# Patient Record
Sex: Male | Born: 1960 | Race: White | Marital: Married | Smoking: Never smoker
Health system: Southern US, Community
[De-identification: ages and names within clinical notes are randomized; demographics above are authoritative.]

## PROBLEM LIST (undated history)

## (undated) DIAGNOSIS — N200 Calculus of kidney: Secondary | ICD-10-CM

## (undated) DIAGNOSIS — M5134 Other intervertebral disc degeneration, thoracic region: Secondary | ICD-10-CM

## (undated) DIAGNOSIS — Z9889 Other specified postprocedural states: Secondary | ICD-10-CM

## (undated) DIAGNOSIS — J189 Pneumonia, unspecified organism: Secondary | ICD-10-CM

## (undated) DIAGNOSIS — C4490 Unspecified malignant neoplasm of skin, unspecified: Secondary | ICD-10-CM

## (undated) DIAGNOSIS — I10 Essential (primary) hypertension: Secondary | ICD-10-CM

## (undated) DIAGNOSIS — Z9989 Dependence on other enabling machines and devices: Secondary | ICD-10-CM

## (undated) DIAGNOSIS — F419 Anxiety disorder, unspecified: Secondary | ICD-10-CM

## (undated) DIAGNOSIS — H409 Unspecified glaucoma: Secondary | ICD-10-CM

## (undated) DIAGNOSIS — R112 Nausea with vomiting, unspecified: Secondary | ICD-10-CM

## (undated) DIAGNOSIS — R519 Headache, unspecified: Secondary | ICD-10-CM

## (undated) DIAGNOSIS — Z85828 Personal history of other malignant neoplasm of skin: Secondary | ICD-10-CM

## (undated) DIAGNOSIS — Z973 Presence of spectacles and contact lenses: Secondary | ICD-10-CM

## (undated) DIAGNOSIS — G4733 Obstructive sleep apnea (adult) (pediatric): Secondary | ICD-10-CM

## (undated) DIAGNOSIS — E7800 Pure hypercholesterolemia, unspecified: Secondary | ICD-10-CM

## (undated) DIAGNOSIS — Z87442 Personal history of urinary calculi: Secondary | ICD-10-CM

## (undated) DIAGNOSIS — H01009 Unspecified blepharitis unspecified eye, unspecified eyelid: Secondary | ICD-10-CM

## (undated) DIAGNOSIS — C801 Malignant (primary) neoplasm, unspecified: Secondary | ICD-10-CM

## (undated) DIAGNOSIS — G500 Trigeminal neuralgia: Secondary | ICD-10-CM

## (undated) DIAGNOSIS — M5124 Other intervertebral disc displacement, thoracic region: Secondary | ICD-10-CM

## (undated) DIAGNOSIS — Z8639 Personal history of other endocrine, nutritional and metabolic disease: Secondary | ICD-10-CM

## (undated) DIAGNOSIS — E119 Type 2 diabetes mellitus without complications: Secondary | ICD-10-CM

## (undated) DIAGNOSIS — H01006 Unspecified blepharitis left eye, unspecified eyelid: Secondary | ICD-10-CM

## (undated) DIAGNOSIS — H01003 Unspecified blepharitis right eye, unspecified eyelid: Secondary | ICD-10-CM

## (undated) HISTORY — DX: Other intervertebral disc displacement, thoracic region: M51.24

## (undated) HISTORY — PX: OTHER SURGICAL HISTORY: SHX169

## (undated) HISTORY — DX: Unspecified blepharitis unspecified eye, unspecified eyelid: H01.009

## (undated) HISTORY — DX: Unspecified malignant neoplasm of skin, unspecified: C44.90

## (undated) HISTORY — PX: VASECTOMY: SHX75

## (undated) HISTORY — PX: MOHS SURGERY: SHX181

## (undated) HISTORY — PX: CYST EXCISION: SHX5701

## (undated) HISTORY — DX: Essential (primary) hypertension: I10

## (undated) HISTORY — DX: Other intervertebral disc degeneration, thoracic region: M51.34

## (undated) HISTORY — DX: Personal history of other endocrine, nutritional and metabolic disease: Z86.39

## (undated) HISTORY — PX: CYSTOSCOPY/URETEROSCOPY/HOLMIUM LASER/STENT PLACEMENT: SHX6546

## (undated) HISTORY — PX: GALLBLADDER SURGERY: SHX652

## (undated) HISTORY — PX: TONSILLECTOMY: SUR1361

## (undated) HISTORY — PX: NOSE SURGERY: SHX723

## (undated) HISTORY — DX: Pure hypercholesterolemia, unspecified: E78.00

## (undated) HISTORY — DX: Anxiety disorder, unspecified: F41.9

## (undated) HISTORY — PX: LITHOTRIPSY: SUR834

## (undated) HISTORY — PX: EXTRACORPOREAL SHOCK WAVE LITHOTRIPSY: SHX1557

## (undated) HISTORY — PX: LAPAROSCOPIC CHOLECYSTECTOMY: SUR755

---
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
## 2016-10-14 HISTORY — PX: NASAL TURBINATE REDUCTION: SHX2072

## 2017-02-14 DIAGNOSIS — I10 Essential (primary) hypertension: Secondary | ICD-10-CM | POA: Diagnosis not present

## 2017-02-14 DIAGNOSIS — M545 Low back pain: Secondary | ICD-10-CM | POA: Diagnosis not present

## 2017-02-14 DIAGNOSIS — E784 Other hyperlipidemia: Secondary | ICD-10-CM | POA: Diagnosis not present

## 2017-03-18 DIAGNOSIS — M5124 Other intervertebral disc displacement, thoracic region: Secondary | ICD-10-CM | POA: Diagnosis not present

## 2017-03-18 DIAGNOSIS — M47814 Spondylosis without myelopathy or radiculopathy, thoracic region: Secondary | ICD-10-CM | POA: Diagnosis not present

## 2017-03-18 DIAGNOSIS — M546 Pain in thoracic spine: Secondary | ICD-10-CM | POA: Diagnosis not present

## 2017-03-19 ENCOUNTER — Other Ambulatory Visit: Payer: Self-pay | Admitting: Physical Medicine and Rehabilitation

## 2017-03-19 DIAGNOSIS — M549 Dorsalgia, unspecified: Secondary | ICD-10-CM

## 2017-03-24 ENCOUNTER — Other Ambulatory Visit: Payer: Self-pay

## 2017-03-25 ENCOUNTER — Other Ambulatory Visit: Payer: 59

## 2017-03-27 ENCOUNTER — Other Ambulatory Visit: Payer: 59

## 2017-03-28 ENCOUNTER — Other Ambulatory Visit: Payer: Self-pay | Admitting: Physical Medicine and Rehabilitation

## 2017-03-28 ENCOUNTER — Ambulatory Visit
Admission: RE | Admit: 2017-03-28 | Discharge: 2017-03-28 | Disposition: A | Discharge status: Home / Self Care | Payer: 59 | Source: Ambulatory Visit | Attending: Physical Medicine and Rehabilitation | Admitting: Physical Medicine and Rehabilitation

## 2017-03-28 DIAGNOSIS — M549 Dorsalgia, unspecified: Secondary | ICD-10-CM

## 2017-03-28 DIAGNOSIS — M47817 Spondylosis without myelopathy or radiculopathy, lumbosacral region: Secondary | ICD-10-CM | POA: Diagnosis not present

## 2017-03-28 MED ORDER — TRIAMCINOLONE ACETONIDE 40 MG/ML IJ SUSP (RADIOLOGY): 60.0000 mg | Freq: Once | INTRAMUSCULAR | Status: DC

## 2017-03-28 MED ORDER — IOPAMIDOL (ISOVUE-M 300) INJECTION 61%
1.0000 mL | Freq: Once | INTRAMUSCULAR | Status: DC | PRN
Start: 2017-03-28 — End: 2017-03-29

## 2017-03-28 NOTE — Discharge Instructions (Signed)

## 2017-04-08 DIAGNOSIS — M546 Pain in thoracic spine: Secondary | ICD-10-CM | POA: Diagnosis not present

## 2017-04-08 DIAGNOSIS — M47814 Spondylosis without myelopathy or radiculopathy, thoracic region: Secondary | ICD-10-CM | POA: Diagnosis not present

## 2017-04-23 DIAGNOSIS — H2513 Age-related nuclear cataract, bilateral: Secondary | ICD-10-CM | POA: Diagnosis not present

## 2017-04-23 DIAGNOSIS — H40033 Anatomical narrow angle, bilateral: Secondary | ICD-10-CM | POA: Diagnosis not present

## 2017-05-05 DIAGNOSIS — H40033 Anatomical narrow angle, bilateral: Secondary | ICD-10-CM | POA: Diagnosis not present

## 2017-05-08 DIAGNOSIS — Z9989 Dependence on other enabling machines and devices: Secondary | ICD-10-CM | POA: Diagnosis not present

## 2017-05-08 DIAGNOSIS — J343 Hypertrophy of nasal turbinates: Secondary | ICD-10-CM | POA: Diagnosis not present

## 2017-05-08 DIAGNOSIS — G4733 Obstructive sleep apnea (adult) (pediatric): Secondary | ICD-10-CM | POA: Diagnosis not present

## 2017-05-19 DIAGNOSIS — M545 Low back pain: Secondary | ICD-10-CM | POA: Diagnosis not present

## 2017-05-19 DIAGNOSIS — I10 Essential (primary) hypertension: Secondary | ICD-10-CM | POA: Diagnosis not present

## 2017-05-19 DIAGNOSIS — E784 Other hyperlipidemia: Secondary | ICD-10-CM | POA: Diagnosis not present

## 2017-05-21 DIAGNOSIS — H10411 Chronic giant papillary conjunctivitis, right eye: Secondary | ICD-10-CM | POA: Diagnosis not present

## 2017-06-02 DIAGNOSIS — H40032 Anatomical narrow angle, left eye: Secondary | ICD-10-CM | POA: Diagnosis not present

## 2017-07-07 DIAGNOSIS — H01021 Squamous blepharitis right upper eyelid: Secondary | ICD-10-CM | POA: Diagnosis not present

## 2017-07-07 DIAGNOSIS — H01022 Squamous blepharitis right lower eyelid: Secondary | ICD-10-CM | POA: Diagnosis not present

## 2017-07-07 DIAGNOSIS — H10413 Chronic giant papillary conjunctivitis, bilateral: Secondary | ICD-10-CM | POA: Diagnosis not present

## 2017-07-10 DIAGNOSIS — Z9989 Dependence on other enabling machines and devices: Secondary | ICD-10-CM | POA: Diagnosis not present

## 2017-07-10 DIAGNOSIS — G4733 Obstructive sleep apnea (adult) (pediatric): Secondary | ICD-10-CM | POA: Diagnosis not present

## 2017-07-10 DIAGNOSIS — J343 Hypertrophy of nasal turbinates: Secondary | ICD-10-CM | POA: Diagnosis not present

## 2017-07-25 DIAGNOSIS — J343 Hypertrophy of nasal turbinates: Secondary | ICD-10-CM | POA: Diagnosis not present

## 2017-08-26 ENCOUNTER — Other Ambulatory Visit (HOSPITAL_BASED_OUTPATIENT_CLINIC_OR_DEPARTMENT_OTHER): Payer: Self-pay

## 2017-08-26 DIAGNOSIS — G4730 Sleep apnea, unspecified: Secondary | ICD-10-CM

## 2017-08-26 DIAGNOSIS — R0683 Snoring: Secondary | ICD-10-CM

## 2017-10-13 DIAGNOSIS — M25512 Pain in left shoulder: Secondary | ICD-10-CM | POA: Diagnosis not present

## 2017-10-14 HISTORY — PX: TRABECULECTOMY: SHX107

## 2017-10-14 HISTORY — PX: OTHER SURGICAL HISTORY: SHX169

## 2017-10-17 ENCOUNTER — Ambulatory Visit (HOSPITAL_BASED_OUTPATIENT_CLINIC_OR_DEPARTMENT_OTHER): Payer: 59 | Attending: Otolaryngology | Admitting: Internal Medicine

## 2017-10-17 VITALS — Ht 71.0 in | Wt 250.0 lb

## 2017-10-17 DIAGNOSIS — G4730 Sleep apnea, unspecified: Secondary | ICD-10-CM | POA: Diagnosis not present

## 2017-10-17 DIAGNOSIS — R0683 Snoring: Secondary | ICD-10-CM | POA: Insufficient documentation

## 2017-10-26 DIAGNOSIS — G4730 Sleep apnea, unspecified: Secondary | ICD-10-CM | POA: Diagnosis not present

## 2017-10-26 NOTE — Procedures (Signed)
   Patient Name: Adam Ford, Adam Ford Study Date: 10/17/2017 Gender: Male D.O.B: 15-Nov-1960 Age (years): 56 Referring Provider: Melida Quitter Height (inches): 71 Interpreting Physician: Baird Lyons MD, ABSM Weight (lbs): 250 RPSGT: Lanae Boast BMI: 35 MRN: [id number] Neck Size: 17.00 <br> <br> CLINICAL INFORMATION Sleep Study Type: NPSG  Indication for sleep study: Snoring  Epworth Sleepiness Score: 17  SLEEP STUDY TECHNIQUE As per the AASM Manual for the Scoring of Sleep and Associated Events v2.3 (April 2016) with a hypopnea requiring 4% desaturations.  The channels recorded and monitored were frontal, central and occipital EEG, electrooculogram (EOG), submentalis EMG (chin), nasal and oral airflow, thoracic and abdominal wall motion, anterior tibialis EMG, snore microphone, electrocardiogram, and pulse oximetry.  MEDICATIONS Medications self-administered by patient taken the night of the study : none reported  SLEEP ARCHITECTURE The study was initiated at 10:38:20 PM and ended at 4:43:24 AM.  Sleep onset time was 4.2 minutes and the sleep efficiency was 90.3%. The total sleep time was 329.8 minutes.  Stage REM latency was 35.5 minutes.  The patient spent 10.01% of the night in stage N1 sleep, 62.25% in stage N2 sleep, 0.00% in stage N3 and 27.74% in REM.  Alpha intrusion was absent.  Supine sleep was 63.97%.  RESPIRATORY PARAMETERS The overall apnea/hypopnea index (AHI) was 3.3 per hour. There were 9 total apneas, including 5 obstructive, 4 central and 0 mixed apneas. There were 9 hypopneas and 32 RERAs.  The AHI during Stage REM sleep was 0.7 per hour.  AHI while supine was 3.7 per hour.  The mean oxygen saturation was 93.58%. The minimum SpO2 during sleep was 87.00%.  moderate snoring was noted during this study.  CARDIAC DATA The 2 lead EKG demonstrated sinus rhythm. The mean heart rate was 55.68 beats per minute. Other EKG findings include:  None.  LEG MOVEMENT DATA The total PLMS were 0 with a resulting PLMS index of 0.00. Associated arousal with leg movement index was 0.0 .  IMPRESSIONS - No significant obstructive sleep apnea occurred during this study (AHI = 3.3/h). - No significant central sleep apnea occurred during this study (CAI = 0.7/h). - Mild oxygen desaturation was noted during this study (Min O2 = 87.00%, Mean 93.5%). - The patient snored with moderate snoring volume. - No cardiac abnormalities were noted during this study. - Clinically significant periodic limb movements did not occur during sleep. No significant associated arousals.  DIAGNOSIS      Primary snoring  RECOMMENDATIONS - Be careful with alcohol, sedatives and other CNS depressants that may worsen sleep apnea and disrupt normal sleep architecture. - Sleep hygiene should be reviewed to assess factors that may improve sleep quality. - Weight management and regular exercise should be initiated or continued if appropriate.  [Electronically signed] 10/26/2017 10:22 AM  Baird Lyons MD, ABSM Diplomate, American Board of Sleep Medicine   NPI: [id number]                          [place] [place]  ELECTRONICALLY SIGNED ON:  10/26/2017, 10:23 AM [place] PH: [phone]   FX: [phone] [place]

## 2017-11-13 DIAGNOSIS — J343 Hypertrophy of nasal turbinates: Secondary | ICD-10-CM | POA: Diagnosis not present

## 2017-11-13 DIAGNOSIS — J3489 Other specified disorders of nose and nasal sinuses: Secondary | ICD-10-CM | POA: Diagnosis not present

## 2017-11-19 DIAGNOSIS — R7303 Prediabetes: Secondary | ICD-10-CM | POA: Diagnosis not present

## 2017-11-19 DIAGNOSIS — E7849 Other hyperlipidemia: Secondary | ICD-10-CM | POA: Diagnosis not present

## 2017-11-19 DIAGNOSIS — I10 Essential (primary) hypertension: Secondary | ICD-10-CM | POA: Diagnosis not present

## 2017-11-25 DIAGNOSIS — M47814 Spondylosis without myelopathy or radiculopathy, thoracic region: Secondary | ICD-10-CM | POA: Diagnosis not present

## 2017-11-25 DIAGNOSIS — M546 Pain in thoracic spine: Secondary | ICD-10-CM | POA: Diagnosis not present

## 2017-11-25 DIAGNOSIS — M791 Myalgia, unspecified site: Secondary | ICD-10-CM | POA: Diagnosis not present

## 2017-12-01 DIAGNOSIS — M62830 Muscle spasm of back: Secondary | ICD-10-CM | POA: Diagnosis not present

## 2017-12-01 DIAGNOSIS — M546 Pain in thoracic spine: Secondary | ICD-10-CM | POA: Diagnosis not present

## 2017-12-03 DIAGNOSIS — M546 Pain in thoracic spine: Secondary | ICD-10-CM | POA: Diagnosis not present

## 2017-12-03 DIAGNOSIS — M62830 Muscle spasm of back: Secondary | ICD-10-CM | POA: Diagnosis not present

## 2017-12-08 DIAGNOSIS — M546 Pain in thoracic spine: Secondary | ICD-10-CM | POA: Diagnosis not present

## 2017-12-08 DIAGNOSIS — M62830 Muscle spasm of back: Secondary | ICD-10-CM | POA: Diagnosis not present

## 2017-12-10 DIAGNOSIS — M546 Pain in thoracic spine: Secondary | ICD-10-CM | POA: Diagnosis not present

## 2017-12-10 DIAGNOSIS — M62830 Muscle spasm of back: Secondary | ICD-10-CM | POA: Diagnosis not present

## 2017-12-17 DIAGNOSIS — M546 Pain in thoracic spine: Secondary | ICD-10-CM | POA: Diagnosis not present

## 2017-12-17 DIAGNOSIS — M62830 Muscle spasm of back: Secondary | ICD-10-CM | POA: Diagnosis not present

## 2017-12-19 DIAGNOSIS — M546 Pain in thoracic spine: Secondary | ICD-10-CM | POA: Diagnosis not present

## 2017-12-19 DIAGNOSIS — M62830 Muscle spasm of back: Secondary | ICD-10-CM | POA: Diagnosis not present

## 2017-12-22 DIAGNOSIS — M62830 Muscle spasm of back: Secondary | ICD-10-CM | POA: Diagnosis not present

## 2017-12-22 DIAGNOSIS — M546 Pain in thoracic spine: Secondary | ICD-10-CM | POA: Diagnosis not present

## 2017-12-24 DIAGNOSIS — M62830 Muscle spasm of back: Secondary | ICD-10-CM | POA: Diagnosis not present

## 2017-12-24 DIAGNOSIS — M546 Pain in thoracic spine: Secondary | ICD-10-CM | POA: Diagnosis not present

## 2018-01-02 DIAGNOSIS — J3489 Other specified disorders of nose and nasal sinuses: Secondary | ICD-10-CM | POA: Diagnosis not present

## 2018-01-02 DIAGNOSIS — C44311 Basal cell carcinoma of skin of nose: Secondary | ICD-10-CM | POA: Diagnosis not present

## 2018-01-06 DIAGNOSIS — M546 Pain in thoracic spine: Secondary | ICD-10-CM | POA: Diagnosis not present

## 2018-01-06 DIAGNOSIS — M47814 Spondylosis without myelopathy or radiculopathy, thoracic region: Secondary | ICD-10-CM | POA: Diagnosis not present

## 2018-01-06 DIAGNOSIS — M62830 Muscle spasm of back: Secondary | ICD-10-CM | POA: Diagnosis not present

## 2018-01-14 DIAGNOSIS — M546 Pain in thoracic spine: Secondary | ICD-10-CM | POA: Diagnosis not present

## 2018-01-14 DIAGNOSIS — M62830 Muscle spasm of back: Secondary | ICD-10-CM | POA: Diagnosis not present

## 2018-01-19 DIAGNOSIS — M62830 Muscle spasm of back: Secondary | ICD-10-CM | POA: Diagnosis not present

## 2018-01-19 DIAGNOSIS — M546 Pain in thoracic spine: Secondary | ICD-10-CM | POA: Diagnosis not present

## 2018-01-21 DIAGNOSIS — M62830 Muscle spasm of back: Secondary | ICD-10-CM | POA: Diagnosis not present

## 2018-01-21 DIAGNOSIS — M546 Pain in thoracic spine: Secondary | ICD-10-CM | POA: Diagnosis not present

## 2018-01-28 DIAGNOSIS — M546 Pain in thoracic spine: Secondary | ICD-10-CM | POA: Diagnosis not present

## 2018-01-28 DIAGNOSIS — M62830 Muscle spasm of back: Secondary | ICD-10-CM | POA: Diagnosis not present

## 2018-02-05 DIAGNOSIS — D22111 Melanocytic nevi of right upper eyelid, including canthus: Secondary | ICD-10-CM | POA: Diagnosis not present

## 2018-02-05 DIAGNOSIS — C44311 Basal cell carcinoma of skin of nose: Secondary | ICD-10-CM | POA: Diagnosis not present

## 2018-02-16 DIAGNOSIS — I10 Essential (primary) hypertension: Secondary | ICD-10-CM | POA: Diagnosis not present

## 2018-02-16 DIAGNOSIS — H5711 Ocular pain, right eye: Secondary | ICD-10-CM | POA: Diagnosis not present

## 2018-02-16 DIAGNOSIS — G44209 Tension-type headache, unspecified, not intractable: Secondary | ICD-10-CM | POA: Diagnosis not present

## 2018-02-16 DIAGNOSIS — E7849 Other hyperlipidemia: Secondary | ICD-10-CM | POA: Diagnosis not present

## 2018-02-16 DIAGNOSIS — H40013 Open angle with borderline findings, low risk, bilateral: Secondary | ICD-10-CM | POA: Diagnosis not present

## 2018-02-16 DIAGNOSIS — R7303 Prediabetes: Secondary | ICD-10-CM | POA: Diagnosis not present

## 2018-02-25 DIAGNOSIS — C44311 Basal cell carcinoma of skin of nose: Secondary | ICD-10-CM | POA: Diagnosis not present

## 2018-03-16 DIAGNOSIS — H01021 Squamous blepharitis right upper eyelid: Secondary | ICD-10-CM | POA: Diagnosis not present

## 2018-03-16 DIAGNOSIS — H01022 Squamous blepharitis right lower eyelid: Secondary | ICD-10-CM | POA: Diagnosis not present

## 2018-03-16 DIAGNOSIS — H5711 Ocular pain, right eye: Secondary | ICD-10-CM | POA: Diagnosis not present

## 2018-03-17 DIAGNOSIS — J343 Hypertrophy of nasal turbinates: Secondary | ICD-10-CM | POA: Diagnosis not present

## 2018-03-17 DIAGNOSIS — R51 Headache: Secondary | ICD-10-CM | POA: Diagnosis not present

## 2018-03-27 DIAGNOSIS — D225 Melanocytic nevi of trunk: Secondary | ICD-10-CM | POA: Diagnosis not present

## 2018-03-27 DIAGNOSIS — Z85828 Personal history of other malignant neoplasm of skin: Secondary | ICD-10-CM | POA: Diagnosis not present

## 2018-03-27 DIAGNOSIS — L738 Other specified follicular disorders: Secondary | ICD-10-CM | POA: Diagnosis not present

## 2018-04-07 DIAGNOSIS — R590 Localized enlarged lymph nodes: Secondary | ICD-10-CM | POA: Diagnosis not present

## 2018-04-23 ENCOUNTER — Encounter

## 2018-04-23 ENCOUNTER — Encounter: Payer: Self-pay | Admitting: Neurology

## 2018-04-23 ENCOUNTER — Ambulatory Visit: Payer: 59 | Admitting: Neurology

## 2018-04-23 ENCOUNTER — Encounter: Payer: Self-pay | Admitting: *Deleted

## 2018-04-23 VITALS — BP 157/79 | HR 59 | Ht 71.0 in | Wt 263.0 lb

## 2018-04-23 DIAGNOSIS — G500 Trigeminal neuralgia: Secondary | ICD-10-CM | POA: Diagnosis not present

## 2018-04-23 DIAGNOSIS — H5711 Ocular pain, right eye: Secondary | ICD-10-CM

## 2018-04-23 DIAGNOSIS — R51 Headache: Secondary | ICD-10-CM | POA: Diagnosis not present

## 2018-04-23 DIAGNOSIS — H532 Diplopia: Secondary | ICD-10-CM

## 2018-04-23 DIAGNOSIS — H93A9 Pulsatile tinnitus, unspecified ear: Secondary | ICD-10-CM

## 2018-04-23 DIAGNOSIS — R519 Headache, unspecified: Secondary | ICD-10-CM

## 2018-04-23 DIAGNOSIS — Z8249 Family history of ischemic heart disease and other diseases of the circulatory system: Secondary | ICD-10-CM

## 2018-04-23 MED ORDER — PREDNISONE 20 MG PO TABS: 60.0000 mg | ORAL_TABLET | Freq: Every day | ORAL | 3 refills | Status: DC

## 2018-04-23 NOTE — Progress Notes (Signed)
[facility]    Provider:  Dr Jaynee Eagles Referring Provider: Damaris Hippo, MD Primary Care Physician:  Donald Prose, MD  CC:  Pain above right eye  HPI:  Adam Ford is a 57 y.o. male here as a referral from Dr. Inda Castle for eye pain rule out cluster headaches.  Past medical history anxiety, hypertension, high cholesterol. Back in February after the new year he had a regular eye check and a month later he had pain over his right eye and headaches. Headaches worsened. He saw ophtho again Dr. Patrice Paradise and everything was fine. Dr. Redmond Baseman performed sinus procedures in February in the same timeframe the headaches started but unclear. Headaches worsening and a dull pain over the forehead. Mostly on the right, right at one point (points to the supratrochlear nerve). Every other week he gets a sharp strong pain he had to close his eye and squint and slow down, no lacrimation or other autonomic symptoms. Lasts briefly 10 minutes not stabbing. The pain above the right eyebrown is continuous all day long and the forehead is dull. He wake up with the headaches, alleve and tylenol. Ringing in the ears. Worsening.  No migrainous symptoms. Blurry.double vision right eye, can wake with the headaches and position makes it worse. No other focal neurologic deficits, associated symptoms, inciting events or modifiable factors.  Reviewed notes, labs and imaging from outside physicians, which showed:  Reviewed [facility] notes.  Patient was seen for an emergency visit.  Chief complaint was feeling weak and a sore sensation in the right eye for 3 weeks.  Headache above the right brow that last from several minutes to an hour.  Resolves on its own not currently on treatment.  Examination showed no APD, extraocular movements intact, external exam normal, intraocular pressure borderline, slit-lamp exam unremarkable, no optic nerve edema normal-appearing optic nerves, diagnosed with tension headache versus  cluster headaches, pain in her around the eye, borderline glaucoma angle with borderline findings.   Months ago started having pain of her right eye went to eye doctor and underwent testing to determine eyes not the issue always a dull ache above his right eye once a week or every other week he will have worsened shooting pain, starting to have low-grade pain over both eyes he takes 2 Aleve's and 2 Tylenols twice daily also for bulging disc if he does not take those the pain is worse.  Review of Systems: Patient complains of symptoms per HPI as well as the following symptoms: Blurred vision, eye pain, memory loss, headache, insomnia. Pertinent negatives and positives per HPI. All others negative.   Social History   Socioeconomic History  . Marital status: Married    Spouse name: Not on file  . Number of children: 4  . Years of education: Not on file  . Highest education level: Master's degree (e.g., MA, MS, MEng, MEd, MSW, MBA)  Occupational History  . Not on file  Social Needs  . Financial resource strain: Not on file  . Food insecurity:    Worry: Not on file    Inability: Not on file  . Transportation needs:    Medical: Not on file    Non-medical: Not on file  Tobacco Use  . Smoking status: Never Smoker  . Smokeless tobacco: Never Used  Substance and Sexual Activity  . Alcohol use: Never    Frequency: Never  . Drug use: Never  . Sexual activity: Not on file  Lifestyle  . Physical activity:  Days per week: Not on file    Minutes per session: Not on file  . Stress: Not on file  Relationships  . Social connections:    Talks on phone: Not on file    Gets together: Not on file    Attends religious service: Not on file    Active member of club or organization: Not on file    Attends meetings of clubs or organizations: Not on file    Relationship status: Not on file  . Intimate partner violence:    Fear of current or ex partner: Not on file    Emotionally abused: Not on  file    Physically abused: Not on file    Forced sexual activity: Not on file  Other Topics Concern  . Not on file  Social History Narrative   Lives at home with his wife & children   Right handed   Caffeine: quit in 2015 but now drinks diet soda which helps his headaches. He drinks about 6-7 cans during the work days.     Family History  Problem Relation Age of Onset  . Hypertension Mother   . Hypertension Father   . Aneurysm Father   . Colon cancer Maternal Grandmother   . Stroke Paternal Grandfather   . Stroke Other   . High Cholesterol Other        on both sides  . Other Neg Hx     Past Medical History:  Diagnosis Date  . Anxiety   . Blepharitis   . Bulging of thoracic intervertebral disc    T8  . Hx of non-insulin dependent diabetes mellitus   . Hypercholesteremia   . Hypertension   . Skin cancer     Past Surgical History:  Procedure Laterality Date  . GALLBLADDER SURGERY    . GASTRIC SLEEVE    . lasers yag iridotomy Bilateral   . LITHOTRIPSY    . MOHS SURGERY     Dr. Pablo Ledger  . NOSE SURGERY     Dr. Redmond Baseman; x2  . TONSILLECTOMY     as a child   . VASECTOMY      Current Outpatient Medications  Medication Sig Dispense Refill  . acetaminophen (TYLENOL) 500 MG tablet Take 1,000 mg by mouth 2 (two) times daily.    Marland Kitchen amLODipine-benazepril (LOTREL) 10-40 MG capsule Take 1 capsule by mouth daily.     . metoprolol succinate (TOPROL-XL) 50 MG 24 hr tablet Take 50 mg by mouth daily.     . Multiple Vitamin (MULTIVITAMIN) capsule Take 1 capsule by mouth daily.     . Naproxen Sodium (ALEVE PO) Take 2 tablets by mouth 2 (two) times daily.     . rosuvastatin (CRESTOR) 10 MG tablet Take 10 mg by mouth daily.     . predniSONE (DELTASONE) 20 MG tablet Take 3 tablets (60 mg total) by mouth daily. 15 tablet 3   No current facility-administered medications for this visit.     Allergies as of 04/23/2018 - Review Complete 04/23/2018  Allergen Reaction Noted  . Bee venom  Anaphylaxis 03/28/2017    Vitals: BP (!) 157/79 (BP Location: Right Arm, Patient Position: Sitting) Comment: has been about 140/85ish per pt report  Pulse (!) 59   Ht '5\' 11"'$  (1.803 m)   Wt 263 lb (119.3 kg)   BMI 36.68 kg/m  Last Weight:  Wt Readings from Last 1 Encounters:  04/23/18 263 lb (119.3 kg)   Last Height:   Ht Readings from  Last 1 Encounters:  04/23/18 _0  (1.803 m)    Physical exam: Exam: Gen: NAD, conversant, well nourised, obese, well groomed                     CV: RRR, no MRG. No Carotid Bruits. No peripheral edema, warm, nontender Eyes: Conjunctivae clear without exudates or hemorrhage  Neuro: Detailed Neurologic Exam  Speech:    Speech is normal; fluent and spontaneous with normal comprehension.  Cognition:    The patient is oriented to person, place, and time;     recent and remote memory intact;     language fluent;     normal attention, concentration,     fund of knowledge Cranial Nerves:    The pupils are equal, round, and reactive to light. The fundi are normal and spontaneous venous pulsations are present. Visual fields are full to finger confrontation. Extraocular movements are intact. Trigeminal sensation is intact and the muscles of mastication are normal. The face is symmetric. The palate elevates in the midline. Hearing intact. Voice is normal. Shoulder shrug is normal. The tongue has normal motion without fasciculations.   Coordination:    Normal finger to nose and heel to shin. Normal rapid alternating movements.   Gait:    Heel-toe and tandem gait are normal.   Motor Observation:    No asymmetry, no atrophy, and no involuntary movements noted. Tone:    Normal muscle tone.    Posture:    Posture is normal. normal erect    Strength:    Strength is V/V in the upper and lower limbs.      Sensation: intact to LT     Reflex Exam:  DTR's:    Deep tendon reflexes in the upper and lower extremities are normal bilaterally.    Toes:    The toes are downgoing bilaterally.   Clonus:    Clonus is absent.      Assessment/Plan:  Patient with pain in the right supraorbital distribution. + right eye vision changes, + increased hearing changes,  No migrainous symptoms. No autonomic symptoms. Father died of a brain aneurysm, hx of strokes in grandfather and great grandfather.   MRI brain and MRA head: MRI brain due to concerning symptoms of new onset headaches > 50, morning headaches, positional headaches,vision changes  to look for space occupying mass, chiari or intracranial hypertension (pseudotumor). Also MRA head due to pulsatile tinnitus and FHx of cerebral ruptured aneurysms.  Labs today including esr/crp Steroid dose Supraorbital nerve block   Orders Placed This Encounter  Procedures  . MR BRAIN W WO CONTRAST  . MR MRA HEAD WO CONTRAST  . Sedimentation rate  . C-reactive protein  . Comprehensive metabolic panel  . CBC  . TSH   Meds ordered this encounter  Medications  . predniSONE (DELTASONE) 20 MG tablet    Sig: Take 3 tablets (60 mg total) by mouth daily.    Dispense:  15 tablet    Refill:  3     Discussed: To prevent or relieve headaches, try the following: Cool Compress. Lie down and place a cool compress on your head.  Avoid headache triggers. If certain foods or odors seem to have triggered your migraines in the past, avoid them. A headache diary might help you identify triggers.  Include physical activity in your daily routine. Try a daily walk or other moderate aerobic exercise.  Manage stress. Find healthy ways to cope with the stressors, such as delegating  tasks on your to-do list.  Practice relaxation techniques. Try deep breathing, yoga, massage and visualization.  Eat regularly. Eating regularly scheduled meals and maintaining a healthy diet might help prevent headaches. Also, drink plenty of fluids.  Follow a regular sleep schedule. Sleep deprivation might contribute to  headaches Consider biofeedback. With this mind-body technique, you learn to control certain bodily functions - such as muscle tension, heart rate and blood pressure - to prevent headaches or reduce headache pain.    Proceed to emergency room if you experience new or worsening symptoms or symptoms do not resolve, if you have new neurologic symptoms or if headache is severe, or for any concerning symptom.   Provided education and documentation from American headache Society toolbox including articles on: chronic migraine medication overuse headache, chronic migraines, prevention of migraines, behavioral and other nonpharmacologic treatments for headache.  Performed by Dr. Jaynee Eagles M.D. All procedures a documented below were medically necessary, reasonable and appropriate based on the patient's history, medical diagnosis and physician opinion. Verbal informed consent was obtained from the patient, patient was informed of potential risk of procedure, including bruising, bleeding, hematoma formation, infection, muscle weakness, muscle pain, numbness, transient hypertension, transient hyperglycemia and transient insomnia among others. All areas injected were topically clean with isopropyl rubbing alcohol. Nonsterile nonlatex gloves were worn during the procedure.  1.  Supraorbital nerve block (64400): Supraorbital nerve site was identified along the incision of the frontal bone on the orbital/supraorbital ridge. Medication was injected into the left and right supraorbital nerve areas. Patient's condition is associated with inflammation of the supraorbital and associated muscle groups. Injection was deemed medically necessary, reasonable and appropriate. Injection represents a separate and unique surgical service.   Sarina Ill, MD  [facility] [address] [place], [place] [address]  Phone [phone] Fax [phone]

## 2018-04-23 NOTE — Patient Instructions (Signed)
MRI of the brain and blood vessels Labs Steroid 60mg  (3 pills) daily with breakfast  Prednisone tablets What is this medicine? PREDNISONE (PRED ni sone) is a corticosteroid. It is commonly used to treat inflammation of the skin, joints, lungs, and other organs. Common conditions treated include asthma, allergies, and arthritis. It is also used for other conditions, such as blood disorders and diseases of the adrenal glands. This medicine may be used for other purposes; ask your health care provider or pharmacist if you have questions. COMMON BRAND NAME(S): Deltasone, Predone, Sterapred, Sterapred DS What should I tell my health care provider before I take this medicine? They need to know if you have any of these conditions: -Cushing's syndrome -diabetes -glaucoma -heart disease -high blood pressure -infection (especially a virus infection such as chickenpox, cold sores, or herpes) -kidney disease -liver disease -mental illness -myasthenia gravis -osteoporosis -seizures -stomach or intestine problems -thyroid disease -an unusual or allergic reaction to lactose, prednisone, other medicines, foods, dyes, or preservatives -pregnant or trying to get pregnant -breast-feeding How should I use this medicine? Take this medicine by mouth with a glass of water. Follow the directions on the prescription label. Take this medicine with food. If you are taking this medicine once a day, take it in the morning. Do not take more medicine than you are told to take. Do not suddenly stop taking your medicine because you may develop a severe reaction. Your doctor will tell you how much medicine to take. If your doctor wants you to stop the medicine, the dose may be slowly lowered over time to avoid any side effects. Talk to your pediatrician regarding the use of this medicine in children. Special care may be needed. Overdosage: If you think you have taken too much of this medicine contact a poison control  center or emergency room at once. NOTE: This medicine is only for you. Do not share this medicine with others. What if I miss a dose? If you miss a dose, take it as soon as you can. If it is almost time for your next dose, talk to your doctor or health care professional. You may need to miss a dose or take an extra dose. Do not take double or extra doses without advice. What may interact with this medicine? Do not take this medicine with any of the following medications: -metyrapone -mifepristone This medicine may also interact with the following medications: -aminoglutethimide -amphotericin B -aspirin and aspirin-like medicines -barbiturates -certain medicines for diabetes, like glipizide or glyburide -cholestyramine -cholinesterase inhibitors -cyclosporine -digoxin -diuretics -ephedrine -male hormones, like estrogens and birth control pills -isoniazid -ketoconazole -NSAIDS, medicines for pain and inflammation, like ibuprofen or naproxen -phenytoin -rifampin -toxoids -vaccines -warfarin This list may not describe all possible interactions. Give your health care provider a list of all the medicines, herbs, non-prescription drugs, or dietary supplements you use. Also tell them if you smoke, drink alcohol, or use illegal drugs. Some items may interact with your medicine. What should I watch for while using this medicine? Visit your doctor or health care professional for regular checks on your progress. If you are taking this medicine over a prolonged period, carry an identification card with your name and address, the type and dose of your medicine, and your doctor's name and address. This medicine may increase your risk of getting an infection. Tell your doctor or health care professional if you are around anyone with measles or chickenpox, or if you develop sores or blisters that do not heal  properly. If you are going to have surgery, tell your doctor or health care professional  that you have taken this medicine within the last twelve months. Ask your doctor or health care professional about your diet. You may need to lower the amount of salt you eat. This medicine may affect blood sugar levels. If you have diabetes, check with your doctor or health care professional before you change your diet or the dose of your diabetic medicine. What side effects may I notice from receiving this medicine? Side effects that you should report to your doctor or health care professional as soon as possible: -allergic reactions like skin rash, itching or hives, swelling of the face, lips, or tongue -changes in emotions or moods -changes in vision -depressed mood -eye pain -fever or chills, cough, sore throat, pain or difficulty passing urine -increased thirst -swelling of ankles, feet Side effects that usually do not require medical attention (report to your doctor or health care professional if they continue or are bothersome): -confusion, excitement, restlessness -headache -nausea, vomiting -skin problems, acne, thin and shiny skin -trouble sleeping -weight gain This list may not describe all possible side effects. Call your doctor for medical advice about side effects. You may report side effects to FDA at 1-800-FDA-1088. Where should I keep my medicine? Keep out of the reach of children. Store at room temperature between 15 and 30 degrees C (59 and 86 degrees F). Protect from light. Keep container tightly closed. Throw away any unused medicine after the expiration date. NOTE: This sheet is a summary. It may not cover all possible information. If you have questions about this medicine, talk to your doctor, pharmacist, or health care provider.  2018 Elsevier/Gold Standard (2011-05-16 10:57:14)

## 2018-04-23 NOTE — Progress Notes (Signed)
Nerve block w/o steroid prepared & administered by MD: 0.25% Bupivocaine 1.5 mL LOT: [id number] EXP: 03/22  2% Lidocaine LOT: [id number] EXP: 06/15/2019

## 2018-04-24 ENCOUNTER — Telehealth: Payer: Self-pay | Admitting: Neurology

## 2018-04-24 LAB — COMPREHENSIVE METABOLIC PANEL
ALT: 33 IU/L (ref 0–44)
AST: 31 IU/L (ref 0–40)
Albumin/Globulin Ratio: 1.8 (ref 1.2–2.2)
Albumin: 4.5 g/dL (ref 3.5–5.5)
Alkaline Phosphatase: 62 IU/L (ref 39–117)
BUN/Creatinine Ratio: 17 (ref 9–20)
BUN: 18 mg/dL (ref 6–24)
Bilirubin Total: 0.8 mg/dL (ref 0.0–1.2)
CO2: 23 mmol/L (ref 20–29)
Calcium: 9.5 mg/dL (ref 8.7–10.2)
Chloride: 104 mmol/L (ref 96–106)
Creatinine, Ser: 1.06 mg/dL (ref 0.76–1.27)
GFR calc Af Amer: 92 mL/min/{1.73_m2} (ref >59)
GFR calc non Af Amer: 79 mL/min/{1.73_m2} (ref >59)
Globulin, Total: 2.5 g/dL (ref 1.5–4.5)
Glucose: 124 mg/dL — ABNORMAL HIGH (ref 65–99)
Potassium: 4.4 mmol/L (ref 3.5–5.2)
Sodium: 142 mmol/L (ref 134–144)
Total Protein: 7 g/dL (ref 6.0–8.5)

## 2018-04-24 LAB — C-REACTIVE PROTEIN: CRP: 1 mg/L (ref 0–10)

## 2018-04-24 LAB — CBC
Hematocrit: 41.8 % (ref 37.5–51.0)
Hemoglobin: 13.7 g/dL (ref 13.0–17.7)
MCH: 26.2 pg — ABNORMAL LOW (ref 26.6–33.0)
MCHC: 32.8 g/dL (ref 31.5–35.7)
MCV: 80 fL (ref 79–97)
Platelets: 226 10*3/uL (ref 150–450)
RBC: 5.22 x10E6/uL (ref 4.14–5.80)
RDW: 16 % — ABNORMAL HIGH (ref 12.3–15.4)
WBC: 5.1 10*3/uL (ref 3.4–10.8)

## 2018-04-24 LAB — TSH: TSH: 0.956 u[IU]/mL (ref 0.450–4.500)

## 2018-04-24 LAB — SEDIMENTATION RATE: Sed Rate: 5 mm/hr (ref 0–30)

## 2018-04-24 NOTE — Telephone Encounter (Signed)
Called the patient and made him aware of the lab work findings. Informed him that all looked good except the glucose level was slightly elevated. I have instructed the patient to make his PCP aware of this so they can keep an eye on his glucose levels moving forward. Pt verbalized understanding. Pt had no questions at this time but was encouraged to call back if questions arise. Pt will wait to hear from someone to get him scheduled with MRI

## 2018-04-24 NOTE — Telephone Encounter (Signed)
-----   Message from Melvenia Beam, MD sent at 04/24/2018  7:50 AM EDT ----- Glucose elevated otherwise normal. Glucose could be elevated because he recently ate however if this is not the case he should be screened for diabetes by his primary care doctor, I didn't see that in his records (please inquire) thanks.

## 2018-04-25 ENCOUNTER — Encounter: Payer: Self-pay | Admitting: Neurology

## 2018-04-27 DIAGNOSIS — R51 Headache: Secondary | ICD-10-CM

## 2018-04-27 DIAGNOSIS — G500 Trigeminal neuralgia: Secondary | ICD-10-CM | POA: Insufficient documentation

## 2018-04-27 DIAGNOSIS — R519 Headache, unspecified: Secondary | ICD-10-CM | POA: Insufficient documentation

## 2018-04-28 ENCOUNTER — Telehealth: Payer: Self-pay | Admitting: Neurology

## 2018-04-28 NOTE — Telephone Encounter (Signed)
[place]: [phone] & [phone] (exp. 04/27/18 to 06/11/18)  Left voicemail for pt to call back about scheduling.

## 2018-04-28 NOTE — Telephone Encounter (Signed)
Patient returned my call he is scheduled for 04/29/18 at [facility].

## 2018-04-29 ENCOUNTER — Ambulatory Visit: Payer: 59

## 2018-04-29 DIAGNOSIS — R519 Headache, unspecified: Secondary | ICD-10-CM

## 2018-04-29 DIAGNOSIS — R51 Headache: Secondary | ICD-10-CM

## 2018-04-29 DIAGNOSIS — H5711 Ocular pain, right eye: Secondary | ICD-10-CM | POA: Diagnosis not present

## 2018-04-29 DIAGNOSIS — H93A9 Pulsatile tinnitus, unspecified ear: Secondary | ICD-10-CM

## 2018-04-29 DIAGNOSIS — G500 Trigeminal neuralgia: Secondary | ICD-10-CM

## 2018-04-29 DIAGNOSIS — Z8249 Family history of ischemic heart disease and other diseases of the circulatory system: Secondary | ICD-10-CM

## 2018-04-29 DIAGNOSIS — H532 Diplopia: Secondary | ICD-10-CM

## 2018-04-29 MED ORDER — GADOPENTETATE DIMEGLUMINE 469.01 MG/ML IV SOLN
20.0000 mL | Freq: Once | INTRAVENOUS | Status: AC | PRN
  Administered 2018-04-29: 20 mL via INTRAVENOUS

## 2018-04-30 ENCOUNTER — Other Ambulatory Visit: Payer: Self-pay | Admitting: Neurology

## 2018-04-30 MED ORDER — GABAPENTIN 300 MG PO CAPS: 300.0000 mg | ORAL_CAPSULE | Freq: Three times a day (TID) | ORAL | 11 refills | Status: DC

## 2018-05-01 DIAGNOSIS — R51 Headache: Secondary | ICD-10-CM | POA: Diagnosis not present

## 2018-05-01 DIAGNOSIS — I10 Essential (primary) hypertension: Secondary | ICD-10-CM | POA: Diagnosis not present

## 2018-05-01 DIAGNOSIS — R7303 Prediabetes: Secondary | ICD-10-CM | POA: Diagnosis not present

## 2018-05-04 ENCOUNTER — Telehealth: Payer: Self-pay | Admitting: *Deleted

## 2018-05-04 NOTE — Telephone Encounter (Addendum)
Spoke with pt and informed him that his MRI brain is normal for his age and his MRA head is normal. Pt very appreciative and asked if there was any further follow-up or testing needed per Dr. Jaynee Eagles. RN advised pt she would ask Dr. Jaynee Eagles as there was no clear answer noted at this time since testing was being completed. Pt appreciative.    ----- Message from Melvenia Beam, MD sent at 05/02/2018 10:32 AM EDT ----- MRI of the brain is normal for age. thanks  Notes recorded by Melvenia Beam, MD on 05/02/2018 at 10:38 AM EDT Normal MRA of the blood vessels of the head

## 2018-05-05 ENCOUNTER — Encounter (INDEPENDENT_AMBULATORY_CARE_PROVIDER_SITE_OTHER): Payer: Self-pay

## 2018-05-05 NOTE — Telephone Encounter (Signed)
I will email him thanks

## 2018-05-07 ENCOUNTER — Other Ambulatory Visit: Payer: Self-pay | Admitting: Neurology

## 2018-05-07 ENCOUNTER — Telehealth: Payer: Self-pay | Admitting: Neurology

## 2018-05-07 DIAGNOSIS — R51 Headache: Secondary | ICD-10-CM

## 2018-05-07 DIAGNOSIS — H539 Unspecified visual disturbance: Secondary | ICD-10-CM

## 2018-05-07 DIAGNOSIS — H5711 Ocular pain, right eye: Secondary | ICD-10-CM

## 2018-05-07 DIAGNOSIS — R519 Headache, unspecified: Secondary | ICD-10-CM

## 2018-05-07 DIAGNOSIS — G8929 Other chronic pain: Secondary | ICD-10-CM

## 2018-05-07 DIAGNOSIS — G500 Trigeminal neuralgia: Secondary | ICD-10-CM

## 2018-05-07 NOTE — Telephone Encounter (Signed)
lvm for pt to call back about scheduling [facility] Auth: [id number] (exp. 05/07/18 to 06/21/18)

## 2018-05-12 ENCOUNTER — Telehealth: Payer: Self-pay | Admitting: Neurology

## 2018-05-12 NOTE — Telephone Encounter (Signed)
Patient is going out of town he will call around second week of August to schedule. MR [place].

## 2018-05-14 ENCOUNTER — Other Ambulatory Visit: Payer: Self-pay | Admitting: Neurology

## 2018-05-14 DIAGNOSIS — G500 Trigeminal neuralgia: Secondary | ICD-10-CM

## 2018-05-14 DIAGNOSIS — G8929 Other chronic pain: Secondary | ICD-10-CM

## 2018-05-14 DIAGNOSIS — R519 Headache, unspecified: Secondary | ICD-10-CM

## 2018-05-14 DIAGNOSIS — R51 Headache: Secondary | ICD-10-CM

## 2018-05-14 DIAGNOSIS — H05111 Granuloma of right orbit: Secondary | ICD-10-CM

## 2018-05-16 ENCOUNTER — Encounter: Payer: Self-pay | Admitting: Neurology

## 2018-05-18 ENCOUNTER — Other Ambulatory Visit: Payer: Self-pay | Admitting: Neurology

## 2018-05-18 ENCOUNTER — Telehealth: Payer: Self-pay | Admitting: *Deleted

## 2018-05-18 DIAGNOSIS — S93401A Sprain of unspecified ligament of right ankle, initial encounter: Secondary | ICD-10-CM | POA: Diagnosis not present

## 2018-05-18 DIAGNOSIS — H01022 Squamous blepharitis right lower eyelid: Secondary | ICD-10-CM | POA: Diagnosis not present

## 2018-05-18 DIAGNOSIS — H01021 Squamous blepharitis right upper eyelid: Secondary | ICD-10-CM | POA: Diagnosis not present

## 2018-05-18 DIAGNOSIS — H01024 Squamous blepharitis left upper eyelid: Secondary | ICD-10-CM | POA: Diagnosis not present

## 2018-05-18 MED ORDER — GABAPENTIN 300 MG PO CAPS: 600.0000 mg | ORAL_CAPSULE | Freq: Three times a day (TID) | ORAL | 11 refills | Status: DC

## 2018-05-18 NOTE — Telephone Encounter (Addendum)
Error

## 2018-05-25 NOTE — Telephone Encounter (Signed)
Patient returned my call he is scheduled for 06/03/18 at [facility].

## 2018-06-03 ENCOUNTER — Ambulatory Visit (INDEPENDENT_AMBULATORY_CARE_PROVIDER_SITE_OTHER): Payer: 59

## 2018-06-03 DIAGNOSIS — H05111 Granuloma of right orbit: Secondary | ICD-10-CM

## 2018-06-03 DIAGNOSIS — G8929 Other chronic pain: Secondary | ICD-10-CM

## 2018-06-03 DIAGNOSIS — G500 Trigeminal neuralgia: Secondary | ICD-10-CM | POA: Diagnosis not present

## 2018-06-03 DIAGNOSIS — R519 Headache, unspecified: Secondary | ICD-10-CM

## 2018-06-03 DIAGNOSIS — R51 Headache: Secondary | ICD-10-CM | POA: Diagnosis not present

## 2018-06-03 MED ORDER — GADOBENATE DIMEGLUMINE 529 MG/ML IV SOLN
20.0000 mL | Freq: Once | INTRAVENOUS | Status: AC | PRN
  Administered 2018-06-03: 20 mL via INTRAVENOUS

## 2018-06-09 DIAGNOSIS — R109 Unspecified abdominal pain: Secondary | ICD-10-CM | POA: Diagnosis not present

## 2018-06-09 DIAGNOSIS — R1031 Right lower quadrant pain: Secondary | ICD-10-CM | POA: Diagnosis not present

## 2018-06-13 ENCOUNTER — Other Ambulatory Visit: Payer: Self-pay | Admitting: Family Medicine

## 2018-06-16 ENCOUNTER — Other Ambulatory Visit: Payer: Self-pay | Admitting: Family Medicine

## 2018-06-17 ENCOUNTER — Other Ambulatory Visit: Payer: Self-pay | Admitting: Family Medicine

## 2018-06-17 DIAGNOSIS — M7989 Other specified soft tissue disorders: Secondary | ICD-10-CM

## 2018-06-22 ENCOUNTER — Ambulatory Visit
Admission: RE | Admit: 2018-06-22 | Discharge: 2018-06-22 | Disposition: A | Discharge status: Home / Self Care | Payer: 59 | Source: Ambulatory Visit | Attending: Family Medicine | Admitting: Family Medicine

## 2018-06-22 DIAGNOSIS — R1903 Right lower quadrant abdominal swelling, mass and lump: Secondary | ICD-10-CM | POA: Diagnosis not present

## 2018-06-22 DIAGNOSIS — M7989 Other specified soft tissue disorders: Secondary | ICD-10-CM

## 2018-07-15 DIAGNOSIS — G44029 Chronic cluster headache, not intractable: Secondary | ICD-10-CM | POA: Diagnosis not present

## 2018-07-15 DIAGNOSIS — G500 Trigeminal neuralgia: Secondary | ICD-10-CM | POA: Diagnosis not present

## 2018-08-24 DIAGNOSIS — H109 Unspecified conjunctivitis: Secondary | ICD-10-CM | POA: Diagnosis not present

## 2018-08-24 DIAGNOSIS — J209 Acute bronchitis, unspecified: Secondary | ICD-10-CM | POA: Diagnosis not present

## 2018-08-25 DIAGNOSIS — G44029 Chronic cluster headache, not intractable: Secondary | ICD-10-CM | POA: Diagnosis not present

## 2018-08-25 DIAGNOSIS — I10 Essential (primary) hypertension: Secondary | ICD-10-CM | POA: Diagnosis not present

## 2018-08-25 DIAGNOSIS — G500 Trigeminal neuralgia: Secondary | ICD-10-CM | POA: Diagnosis not present

## 2018-08-27 DIAGNOSIS — Z483 Aftercare following surgery for neoplasm: Secondary | ICD-10-CM | POA: Diagnosis not present

## 2018-08-27 DIAGNOSIS — D170 Benign lipomatous neoplasm of skin and subcutaneous tissue of head, face and neck: Secondary | ICD-10-CM | POA: Diagnosis not present

## 2018-09-01 DIAGNOSIS — R7303 Prediabetes: Secondary | ICD-10-CM | POA: Diagnosis not present

## 2018-09-01 DIAGNOSIS — I10 Essential (primary) hypertension: Secondary | ICD-10-CM | POA: Diagnosis not present

## 2018-09-01 DIAGNOSIS — E785 Hyperlipidemia, unspecified: Secondary | ICD-10-CM | POA: Diagnosis not present

## 2018-10-13 DIAGNOSIS — G500 Trigeminal neuralgia: Secondary | ICD-10-CM | POA: Diagnosis not present

## 2018-10-14 HISTORY — PX: MOHS SURGERY: SHX181

## 2018-10-14 HISTORY — PX: LIPOMA EXCISION: SHX5283

## 2018-11-04 DIAGNOSIS — G44029 Chronic cluster headache, not intractable: Secondary | ICD-10-CM | POA: Diagnosis not present

## 2018-12-10 DIAGNOSIS — I10 Essential (primary) hypertension: Secondary | ICD-10-CM | POA: Diagnosis not present

## 2018-12-10 DIAGNOSIS — Z6838 Body mass index (BMI) 38.0-38.9, adult: Secondary | ICD-10-CM | POA: Diagnosis not present

## 2018-12-10 DIAGNOSIS — G500 Trigeminal neuralgia: Secondary | ICD-10-CM | POA: Diagnosis not present

## 2018-12-11 DIAGNOSIS — L720 Epidermal cyst: Secondary | ICD-10-CM | POA: Diagnosis not present

## 2018-12-11 DIAGNOSIS — L821 Other seborrheic keratosis: Secondary | ICD-10-CM | POA: Diagnosis not present

## 2018-12-11 DIAGNOSIS — L988 Other specified disorders of the skin and subcutaneous tissue: Secondary | ICD-10-CM | POA: Diagnosis not present

## 2018-12-11 DIAGNOSIS — D485 Neoplasm of uncertain behavior of skin: Secondary | ICD-10-CM | POA: Diagnosis not present

## 2018-12-15 ENCOUNTER — Telehealth: Payer: Self-pay | Admitting: Neurology

## 2018-12-15 NOTE — Telephone Encounter (Signed)
Appointment Request From: Adam Ford    With Provider: Melvenia Beam, MD [[facility]]    Preferred Date Range: 12/16/2018 - 02/08/2019    Preferred Times: Monday Afternoon, Tuesday Afternoon, Wednesday Afternoon, Thursday Afternoon, Friday Afternoon    Reason for visit: Request an Appointment    Comments:  Continued care and pain management.

## 2018-12-16 NOTE — Telephone Encounter (Signed)
I spoke with the patient and scheduled him to see Amy NP tomorrow @ 3:30 arrival 15-30 minutes prior. He stated he had seen Dr. Maryjean Ka and tried Oxcarbazepine, Topamax, Gabapentin (had been on). He is taking Gabapentin only now. It does help but the other meds didn't. He takes Gabapentin (300 mg cap) 600 mg up to TID PRN. He was presented with option to do a stimulator in his head but he doesn't want that. He would like to discuss medication options or if he has to stay on the Gabapentin forever then can he use it prn, etc. He verbalized appreciation for the call and will address the questions at [facility] tomorrow.

## 2018-12-16 NOTE — Telephone Encounter (Signed)
Amy can see him. If he is still having pain we would likely start him on gabapentin or topamax or another AED. I'll CC amy so she knows.

## 2018-12-17 ENCOUNTER — Encounter: Payer: Self-pay | Admitting: Family Medicine

## 2018-12-17 ENCOUNTER — Ambulatory Visit: Payer: 59 | Admitting: Family Medicine

## 2018-12-17 VITALS — BP 139/78 | HR 61 | Ht 71.0 in | Wt 275.6 lb

## 2018-12-17 DIAGNOSIS — G500 Trigeminal neuralgia: Secondary | ICD-10-CM | POA: Diagnosis not present

## 2018-12-17 NOTE — Progress Notes (Signed)
PATIENT: Adam Ford DOB: 11/04/1960  REASON FOR VISIT: follow up HISTORY FROM: patient  Chief Complaint  Patient presents with  . Follow-up    Headache follow up. Alone. Rm 9. Patient mentioned that he has tried several medication for headaches but he didn't tolerate them well. He mentioned that Gabapentin seems to help with his headaches.      HISTORY OF PRESENT ILLNESS: Today 12/17/18 Adam Ford is a 58 y.o. male here today for follow up for supraoccipital neuralgia. He continues Gabapentin 600mg  up to 3 times a day. He has tried carbamazepine and topiramate in the past that caused side effects. He feels that gabapentin is working better than anything. He does have concerns of feeling groggy with gabapentin. He has taken Lyrica in the past with similar side effects. He is trying to adjust his dose where he doesn't feel as sleepy.   He was seen for consideration of ablation therapy and nerve stimulator but he is not interested at this time.   HISTORY: (copied from Dr Cathren Laine note on 04/23/2018) HPI:  Adam Ford is a 58 y.o. male here as a referral from Dr. Inda Castle for eye pain rule out cluster headaches.  Past medical history anxiety, hypertension, high cholesterol. Back in February after the new year he had a regular eye check and a month later he had pain over his right eye and headaches. Headaches worsened. He saw ophtho again Dr. Patrice Paradise and everything was fine. Dr. Redmond Baseman performed sinus procedures in February in the same timeframe the headaches started but unclear. Headaches worsening and a dull pain over the forehead. Mostly on the right, right at one point (points to the supratrochlear nerve). Every other week he gets a sharp strong pain he had to close his eye and squint and slow down, no lacrimation or other autonomic symptoms. Lasts briefly 10 minutes not stabbing. The pain above the right eyebrown is continuous all day long and the forehead is dull. He wake up with the  headaches, alleve and tylenol. Ringing in the ears. Worsening.  No migrainous symptoms. Blurry.double vision right eye, can wake with the headaches and position makes it worse. No other focal neurologic deficits, associated symptoms, inciting events or modifiable factors.  Reviewed notes, labs and imaging from outside physicians, which showed:  Reviewed [facility].  Patient was seen for an emergency visit.  Chief complaint was feeling weak and a sore sensation in the right eye for 3 weeks.  Headache above the right brow that last from several minutes to an hour.  Resolves on its own not currently on treatment.  Examination showed no APD, extraocular movements intact, external exam normal, intraocular pressure borderline, slit-lamp exam unremarkable, no optic nerve edema normal-appearing optic nerves, diagnosed with tension headache versus cluster headaches, pain in her around the eye, borderline glaucoma angle with borderline findings.   Months ago started having pain of her right eye went to eye doctor and underwent testing to determine eyes not the issue always a dull ache above his right eye once a week or every other week he will have worsened shooting pain, starting to have low-grade pain over both eyes he takes 2 Aleve's and 2 Tylenols twice daily also for bulging disc if he does not take those the pain is worse.  REVIEW OF SYSTEMS: Out of a complete 14 system review of symptoms, the patient complains only of the following symptoms, ringing in ears, blurred vision, abdominal pain, apnea, back pain, headache and  all other reviewed systems are negative.  ALLERGIES: Allergies  Allergen Reactions  . Bee Venom Anaphylaxis    HOME MEDICATIONS: Outpatient Medications Prior to Visit  Medication Sig Dispense Refill  . amLODipine-benazepril (LOTREL) 10-40 MG capsule Take 1 capsule by mouth daily.     Marland Kitchen gabapentin (NEURONTIN) 300 MG capsule Take 2 capsules (600 mg total) by mouth 3  (three) times daily. 180 capsule 11  . metoprolol succinate (TOPROL-XL) 50 MG 24 hr tablet Take 50 mg by mouth 2 (two) times daily.     . Multiple Vitamin (MULTIVITAMIN) capsule Take 1 capsule by mouth daily.     . rosuvastatin (CRESTOR) 10 MG tablet Take 10 mg by mouth daily.     Marland Kitchen acetaminophen (TYLENOL) 500 MG tablet Take 1,000 mg by mouth 2 (two) times daily.    . predniSONE (DELTASONE) 20 MG tablet Take 3 tablets (60 mg total) by mouth daily. 15 tablet 3  . Naproxen Sodium (ALEVE PO) Take 2 tablets by mouth 2 (two) times daily.      No facility-administered medications prior to visit.     PAST MEDICAL HISTORY: Past Medical History:  Diagnosis Date  . Anxiety   . Blepharitis   . Bulging of thoracic intervertebral disc    T8  . Hx of non-insulin dependent diabetes mellitus   . Hypercholesteremia   . Hypertension   . Skin cancer     PAST SURGICAL HISTORY: Past Surgical History:  Procedure Laterality Date  . GALLBLADDER SURGERY    . GASTRIC SLEEVE    . lasers yag iridotomy Bilateral   . LITHOTRIPSY    . MOHS SURGERY     Dr. Pablo Ledger  . NOSE SURGERY     Dr. Redmond Baseman; x2  . TONSILLECTOMY     as a child   . VASECTOMY      FAMILY HISTORY: Family History  Problem Relation Age of Onset  . Hypertension Mother   . Hypertension Father   . Aneurysm Father   . Colon cancer Maternal Grandmother   . Stroke Paternal Grandfather   . Stroke Other   . High Cholesterol Other        on both sides  . Other Neg Hx     SOCIAL HISTORY: Social History   Socioeconomic History  . Marital status: Married    Spouse name: Not on file  . Number of children: 4  . Years of education: Not on file  . Highest education level: Master's degree (e.g., MA, MS, MEng, MEd, MSW, MBA)  Occupational History  . Not on file  Social Needs  . Financial resource strain: Not on file  . Food insecurity:    Worry: Not on file    Inability: Not on file  . Transportation needs:    Medical: Not on  file    Non-medical: Not on file  Tobacco Use  . Smoking status: Never Smoker  . Smokeless tobacco: Never Used  Substance and Sexual Activity  . Alcohol use: Never    Frequency: Never  . Drug use: Never  . Sexual activity: Not on file  Lifestyle  . Physical activity:    Days per week: Not on file    Minutes per session: Not on file  . Stress: Not on file  Relationships  . Social connections:    Talks on phone: Not on file    Gets together: Not on file    Attends religious service: Not on file    Active member of  club or organization: Not on file    Attends meetings of clubs or organizations: Not on file    Relationship status: Not on file  . Intimate partner violence:    Fear of current or ex partner: Not on file    Emotionally abused: Not on file    Physically abused: Not on file    Forced sexual activity: Not on file  Other Topics Concern  . Not on file  Social History Narrative   Lives at home with his wife & children   Right handed   Caffeine: quit in 2015 but now drinks diet soda which helps his headaches. He drinks about 6-7 cans during the work days.       PHYSICAL EXAM  Vitals:   12/17/18 1507  BP: 139/78  Pulse: 61  Weight: 275 lb 9.6 oz (125 kg)  Height: 5\' 11"  (1.803 m)   Body mass index is 38.44 kg/m.  Generalized: Well developed, in no acute distress  Cardiology: normal rate and rhythm, no murmur noted Neurological examination  Mentation: Alert oriented to time, place, history taking. Follows all commands speech and language fluent Cranial nerve II-XII: Pupils were equal round reactive to light. Extraocular movements were full, visual field were full on confrontational test. Facial sensation and strength were normal. Uvula tongue midline. Head turning and shoulder shrug  were normal and symmetric. Motor: The motor testing reveals 5 over 5 strength of all 4 extremities. Good symmetric motor tone is noted throughout.  Sensory: Sensory testing is  intact to soft touch on all 4 extremities. No evidence of extinction is noted.  Coordination: Cerebellar testing reveals good finger-nose-finger bilaterally.  Gait and station: Gait is normal.  Reflexes: Deep tendon reflexes are symmetric and normal bilaterally.   DIAGNOSTIC DATA (LABS, IMAGING, TESTING) - I reviewed patient records, labs, notes, testing and imaging myself where available.  No flowsheet data found.   Lab Results  Component Value Date   WBC 5.1 04/23/2018   HGB 13.7 04/23/2018   HCT 41.8 04/23/2018   MCV 80 04/23/2018   PLT 226 04/23/2018      Component Value Date/Time   NA 142 04/23/2018 0902   K 4.4 04/23/2018 0902   CL 104 04/23/2018 0902   CO2 23 04/23/2018 0902   GLUCOSE 124 (H) 04/23/2018 0902   BUN 18 04/23/2018 0902   CREATININE 1.06 04/23/2018 0902   CALCIUM 9.5 04/23/2018 0902   PROT 7.0 04/23/2018 0902   ALBUMIN 4.5 04/23/2018 0902   AST 31 04/23/2018 0902   ALT 33 04/23/2018 0902   ALKPHOS 62 04/23/2018 0902   BILITOT 0.8 04/23/2018 0902   GFRNONAA 79 04/23/2018 0902   GFRAA 92 04/23/2018 0902   No results found for: CHOL, HDL, LDLCALC, LDLDIRECT, TRIG, CHOLHDL No results found for: HGBA1C No results found for: VITAMINB12 Lab Results  Component Value Date   TSH 0.956 04/23/2018     ASSESSMENT AND PLAN 58 y.o. year old male  has a past medical history of Anxiety, Blepharitis, Bulging of thoracic intervertebral disc, non-insulin dependent diabetes mellitus, Hypercholesteremia, Hypertension, and Skin cancer. 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  He does have concerns of this medication making him feel groggy.  He is adjusting his dose where he takes more at night and less during the day to see if this helps.  He was instructed not to exceed  600 mg 3 times a day.  He is not interested in ablation therapy or nerve stimulator at this time.  We will follow-up  annually for refills of gabapentin.  He was made aware that PCP could follow if he wishes.  He would like to stay connected with [place].  No orders of the defined types were placed in this encounter.    No orders of the defined types were placed in this encounter.     I spent 15 minutes with the patient. 50% of this time was spent counseling and educating patient on plan of care and medications.    Debbora Presto, FNP-C 12/17/2018, 3:41 PM [facility] [address], [place], [place] [address] [phone]

## 2018-12-17 NOTE — Patient Instructions (Signed)
Gabapentin 600mg  up to three times a day   Occipital Neuralgia  Occipital neuralgia is a type of headache that causes brief episodes of very bad pain in the back of your head. Pain from occipital neuralgia may spread (radiate) to other parts of your head. These headaches may be caused by irritation of the nerves that leave your spinal cord high up in your neck, just below the base of your skull (occipital nerves). Your occipital nerves transmit sensations from the back of your head, the top of your head, and the areas behind your ears. What are the causes? This condition can occur without any known cause (primary headache syndrome). In other cases, this condition is caused by pressure on or irritation of one of the two occipital nerves. Pressure and irritation may be due to:  Muscle spasm in the neck.  Neck injury.  Wear and tear of the vertebrae in the neck (osteoarthritis).  Disease of the disks that separate the vertebrae.  Swollen blood vessels that put pressure on the occipital nerves.  Infections.  Tumors.  Diabetes. What are the signs or symptoms? This condition causes brief burning, stabbing, electric, shocking, or shooting pain which can radiate to the top of the head. It can happen on one side or both sides of the head. It can also cause:  Pain behind the eye.  Pain triggered by neck movement or hair brushing.  Scalp tenderness.  Aching in the back of the head between episodes of very bad pain.  Pain gets worse with exposure to bright lights. How is this diagnosed? There is no test that diagnoses this condition. Your health care provider may diagnose this condition based on a physical exam and your symptoms. Other tests may be done, such as:  Imaging studies of the brain and neck (cervical spine), such as an MRI or CT scan. These look for causes of pinched nerves.  Applying pressure to the nerves in the neck to try to re-create the pain.  Injection of numbing  medicine into the occipital nerve areas to see if pain goes away (diagnostic nerve block). How is this treated? Treatment for this condition may begin with simple measures, such as:  Rest.  Massage.  Applying heat or cold on the area.  Over-the-counter pain relievers. If these measures do not work, you may need other treatments, including:  Medicines, such as: ? Prescription-strength anti-inflammatory medicines. ? Muscle relaxants. ? Anti-seizure medicines, which can relieve pain. ? Antidepressants, which can relieve pain. ? Injected medicines, such as medicines that numb the area (local anesthetic) and steroids.  Pulsed radiofrequency ablation. This is when wires are implanted to deliver electrical impulses that block pain signals from the occipital nerve.  Surgery to relieve nerve pressure.  Physical therapy. Follow these instructions at home: Pain management      Avoid any activities that cause pain.  Rest when you have an attack of pain.  Try gentle massage to relieve pain.  Try a different pillow or sleeping position.  If directed, apply heat to the affected area as told by your health care provider. Use the heat source that your health care provider recommends, such as a moist heat pack or a heating pad. ? Place a towel between your skin and the heat source. ? Leave the heat on for 20-30 minutes. ? Remove the heat if your skin turns bright red. This is especially important if you are unable to feel pain, heat, or cold. You may have a greater risk  of getting burned.  If directed, apply ice to the back of the head and neck area as told by your health care provider. ? Put ice in a plastic bag. ? Place a towel between your skin and the bag. ? Leave the ice on for 20 minutes, 2-3 times per day. General instructions  Take over-the-counter and prescription medicines only as told by your health care provider.  Avoid things that make your symptoms worse, such as  bright lights.  Try to stay active. Get regular exercise that does not cause pain. Ask your health care provider to suggest safe exercises for you.  Work with a physical therapist to learn stretching exercises you can do at home.  Practice good posture.  Keep all follow-up visits as told by your health care provider. This is important. Contact a health care provider if:  Your medicine is not working.  You have new or worsening symptoms. Get help right away if:  You have very bad head pain that does not go away.  You have a sudden change in vision, balance, or speech. Summary  Occipital neuralgia is a type of headache that causes brief episodes of very bad pain in the back of your head.  Pain from occipital neuralgia may spread (radiate) to other parts of your head.  Treatment for this condition includes rest, massage, and medicines. This information is not intended to replace advice given to you by your health care provider. Make sure you discuss any questions you have with your health care provider. Document Released: 09/24/2001 Document Revised: 09/16/2017 Document Reviewed: 12/05/2016 Elsevier Interactive Patient Education  2019 [employer].

## 2018-12-17 NOTE — Progress Notes (Signed)
Made any corrections needed, and agree with history, physical, neuro exam,assessment and plan as stated.     Antonia Ahern, MD [facility]  

## 2019-01-04 ENCOUNTER — Telehealth: Payer: Self-pay | Admitting: Family Medicine

## 2019-01-04 NOTE — Telephone Encounter (Signed)
Pt sent a msg that his left foot was hot at times, he requested an appt. I called the patient and advised since was a new symptom and had not been evaluated for any type of foot problem at the clinic he should call his PCP. Pt was agreeable  FYI

## 2019-01-07 ENCOUNTER — Other Ambulatory Visit: Payer: Self-pay | Admitting: *Deleted

## 2019-01-07 MED ORDER — GABAPENTIN 300 MG PO CAPS: 600.0000 mg | ORAL_CAPSULE | Freq: Three times a day (TID) | ORAL | 3 refills | Status: DC

## 2019-01-18 ENCOUNTER — Encounter: Payer: Self-pay | Admitting: Family Medicine

## 2019-02-04 DIAGNOSIS — S80812A Abrasion, left lower leg, initial encounter: Secondary | ICD-10-CM | POA: Diagnosis not present

## 2019-03-02 DIAGNOSIS — E1169 Type 2 diabetes mellitus with other specified complication: Secondary | ICD-10-CM | POA: Diagnosis not present

## 2019-03-02 DIAGNOSIS — E785 Hyperlipidemia, unspecified: Secondary | ICD-10-CM | POA: Diagnosis not present

## 2019-03-02 DIAGNOSIS — I10 Essential (primary) hypertension: Secondary | ICD-10-CM | POA: Diagnosis not present

## 2019-03-03 DIAGNOSIS — E1169 Type 2 diabetes mellitus with other specified complication: Secondary | ICD-10-CM | POA: Diagnosis not present

## 2019-03-03 DIAGNOSIS — E785 Hyperlipidemia, unspecified: Secondary | ICD-10-CM | POA: Diagnosis not present

## 2019-03-03 DIAGNOSIS — I10 Essential (primary) hypertension: Secondary | ICD-10-CM | POA: Diagnosis not present

## 2019-04-20 ENCOUNTER — Telehealth (INDEPENDENT_AMBULATORY_CARE_PROVIDER_SITE_OTHER): Payer: 59 | Admitting: Family Medicine

## 2019-04-20 ENCOUNTER — Encounter: Payer: Self-pay | Admitting: Family Medicine

## 2019-04-20 DIAGNOSIS — G500 Trigeminal neuralgia: Secondary | ICD-10-CM

## 2019-04-20 MED ORDER — AMITRIPTYLINE HCL 25 MG PO TABS: 25.0000 mg | ORAL_TABLET | Freq: Every day | ORAL | 3 refills | Status: DC

## 2019-04-20 NOTE — Progress Notes (Signed)
Made any corrections needed, and agree with history, physical, neuro exam,assessment and plan as stated.     Mone Commisso, MD [facility]  

## 2019-04-20 NOTE — Progress Notes (Signed)
PATIENT: Adam Ford DOB: 1961/09/08  REASON FOR VISIT: follow up HISTORY FROM: patient  Virtual Visit via Telephone Note  I connected with Adam Ford on 04/20/19 at 10:00 AM EDT by telephone and verified that I am speaking with the correct person using two identifiers.   I discussed the limitations, risks, security and privacy concerns of performing an evaluation and management service by telephone and the availability of in person appointments. I also discussed with the patient that there may be a patient responsible charge related to this service. The patient expressed understanding and agreed to proceed.   History of Present Illness:  04/20/19 Adam Ford is a 58 y.o. male here today for follow up of suborbital headaches. He has been taking gabapentin 300mg  in the am, 300mg  at lunch and 600mg  at bedtime. He reported grogginess with 600mg  TID dosing. He was doing well untl 3-4 days ago when headaches returned and are refractory to treatment. He has increased gabapentin to 600mg  TID with an extra 300mg  dose around 5pm. He reports no changes in the headache other than this is more of a dull achy pain rather than sharp stabbing pain. He reports pain above his right eye as in the past. He has not noticed significant light or sound sensitivity but does report "feeling better when I wear my sunglasses." He denies nausea or vomiting. No vision changes. He reports that headache will spontaneously resolve with time.  HISTORY (copied from my note on 12/17/2018)  Adam Ford is a 58 y.o. male here today for follow up for supraoccipital neuralgia. He continues Gabapentin 600mg  up to 3 times a day. He has tried carbamazepine and topiramate in the past that caused side effects. He feels that gabapentin is working better than anything. He does have concerns of feeling groggy with gabapentin. He has taken Lyrica in the past with similar side effects. He is trying to adjust his dose where he  doesn't feel as sleepy.   He was seen for consideration of ablation therapy and nerve stimulator but he is not interested at this time.   HISTORY: (copied from Dr Cathren Laine note on 04/23/2018) [id number] Adam Ford a 58 y.o.malehere as a referral from Dr. Peggye Ford eye pain rule out cluster headaches. Past medical history anxiety, hypertension, high cholesterol. Back in February after the new year he had a regular eye check and a month later he had pain over his right eye and headaches. Headaches worsened. He saw ophtho again Dr. Patrice Ford and everything was fine. Dr. Redmond Ford performed sinus procedures in February in the same timeframe the headaches started but unclear. Headaches worsening and a dull pain over the forehead. Mostly on the right, right at one point (points to the supratrochlear nerve). Every other week he gets a sharp strong pain he had to close his eye and squint and slow down, no lacrimation or other autonomic symptoms. Lasts briefly 10 minutes not stabbing. The pain above the right eyebrown is continuous all day long and the forehead is dull. He wake up with the headaches, alleve and tylenol. Ringing in the ears. Worsening. No migrainous symptoms. Blurry.double vision right eye, can wake with the headaches and position makes it worse.No other focal neurologic deficits, associated symptoms, inciting events or modifiable factors.  Reviewed notes, labs and imaging from outside physicians, which showed:  Reviewed [facility] notes. Patient was seen for an emergency visit. Chief complaint was feeling weak and a sore sensation in the right eye for 3 weeks. Headache above the  right brow that last from several minutes to an hour. Resolves on its own not currently on treatment. Examination showed no APD, extraocular movements intact, external exam normal, intraocular pressure borderline, slit-lamp exam unremarkable, no optic nerve edema normal-appearing optic nerves, diagnosed with  tension headache versus cluster headaches, pain in her around the eye, borderline glaucoma angle with borderline findings.  Months ago started having pain of her right eye went to eye doctor and underwent testing to determine eyes not the issue always a dull ache above his right eye once a week or every other week he will have worsened shooting pain, starting to have low-grade pain over both eyes he takes 2 Aleve's and 2 Tylenols twice daily also for bulging disc if he does not take those the pain is worse.   Observations/Objective:  Generalized: Well developed, in no acute distress  Mentation: Alert oriented to time, place, history taking. Follows all commands speech and language fluent   Assessment and Plan:  58 y.o. year old male  has a past medical history of Anxiety, Blepharitis, Bulging of thoracic intervertebral disc, non-insulin dependent diabetes mellitus, Hypercholesteremia, Hypertension, and Skin cancer. here with    ICD-10-CM   1. Supraorbital neuralgia  G50.0    Unfortunately, Adam has noticed an increase in frequency and intensity of his headaches over the past 3 to 4 days.  Headache has not been responsive to increased doses of gabapentin.  Headache also seems to have changed to more of an aching sensation rather than sharp stabbing pain.  We have discussed possibility of transitioning migraines.  We have discussed several medication options.  I feel that he may respond well to amitriptyline.  We will start 25 mg at bedtime.  I am hopeful that we may be able to wean gabapentin as he does not feel that this is very effective any longer.  He also states that he is not as sharp on gabapentin.  He was instructed to continue 600 mg times a day for the next week while starting amitriptyline.  Once amitriptyline is in his system he was encouraged to wean gabapentin to 300 mg 3 times a day.  May continue to wean as tolerated.  We have scheduled a 6-week follow-up to determine response to  amitriptyline.  We will also consider abortive therapy if needed.  He was encouraged to use over-the-counter medications if needed but to avoid regular use.  He verbalizes understanding and agreement with this plan.  No orders of the defined types were placed in this encounter.   Meds ordered this encounter  Medications   amitriptyline (ELAVIL) 25 MG tablet    Sig: Take 1 tablet (25 mg total) by mouth at bedtime.    Dispense:  30 tablet    Refill:  3    Order Specific Question:   Supervising Provider    Answer:   Melvenia Beam [id number]     Follow Up Instructions:  I discussed the assessment and treatment plan with the patient. The patient was provided an opportunity to ask questions and all were answered. The patient agreed with the plan and demonstrated an understanding of the instructions.   The patient was advised to call back or seek an in-person evaluation if the symptoms worsen or if the condition fails to improve as anticipated.  I provided 20 minutes of non-face-to-face time during this encounter.  Patient is located at his place of employment during video conference.  Provider is located in her office.  Maryelizabeth Kaufmann,  CMA helped to facilitate visit.   Debbora Presto, NP

## 2019-04-28 ENCOUNTER — Encounter: Payer: Self-pay | Admitting: Family Medicine

## 2019-05-31 NOTE — Progress Notes (Signed)
PATIENT: Adam Ford DOB: 08-30-1961  REASON FOR VISIT: follow up HISTORY FROM: patient  Virtual Visit via Telephone Note  I connected with Adam Ford on 06/01/19 at  8:00 AM EDT by telephone and verified that I am speaking with the correct person using two identifiers.   I discussed the limitations, risks, security and privacy concerns of performing an evaluation and management service by telephone and the availability of in person appointments. I also discussed with the patient that there may be a patient responsible charge related to this service. The patient expressed understanding and agreed to proceed.   History of Present Illness:  06/01/19 Adam Ford is a 58 y.o. male here today for follow up of suborbital headaches. He was started on amitriptyline 25mg  at bedtime at last visit in 04/2019. Unfortunately, he was unable to tolerate side effects. He has continues gabapentin 300mg  up to five times daily during the week and 600mg  three times daily on the weekends. He does feel a little sluggish with medication but feels headaches are stable. He continues to have intermittent pain about 1-2 times daily. Pain is just over the right eye. It resolves in a few minutes to an hour. No other symptoms associated with pain.  It is much better on medication.   History (copied from my note on 04/22/2019)  Adam Ford is a 58 y.o. male here today for follow up of suborbital headaches. He has been taking gabapentin 300mg  in the am, 300mg  at lunch and 600mg  at bedtime. He reported grogginess with 600mg  TID dosing. He was doing well untl 3-4 days ago when headaches returned and are refractory to treatment. He has increased gabapentin to 600mg  TID with an extra 300mg  dose around 5pm. He reports no changes in the headache other than this is more of a dull achy pain rather than sharp stabbing pain. He reports pain above his right eye as in the past. He has not noticed significant light or sound  sensitivity but does report "feeling better when I wear my sunglasses." He denies nausea or vomiting. No vision changes. He reports that headache will spontaneously resolve with time.  HISTORY (copied from my note on 12/17/2018)  Adam Muhlesteinis a 58 y.o.malehere today for follow up for supraoccipital neuralgia. He continues Gabapentin 600mg  up to 3 times a day.He has tried carbamazepine and topiramate in the past that caused side effects. He feels that gabapentin is working better than anything. He does have concerns of feeling groggy with gabapentin. He has taken Lyrica in the past with similar side effects. He is trying to adjust his dose where he doesn't feel as sleepy.   He was seen for consideration of ablation therapy and nerve stimulator but he is not interested at this time.  HISTORY: (copied fromDr Ahern'snote on 04/23/2018) KGM:WNUU Muhlesteinis a 58 y.o.malehere as a referral from Dr. Peggye Form eye pain rule out cluster headaches. Past medical history anxiety, hypertension, high cholesterol. Back in February after the new year he had a regular eye check and a month later he had pain over his right eye and headaches. Headaches worsened. He saw ophtho again Dr. Patrice Paradise and everything was fine. Dr. Redmond Baseman performed sinus procedures in February in the same timeframe the headaches started but unclear. Headaches worsening and a dull pain over the forehead. Mostly on the right, right at one point (points to the supratrochlear nerve). Every other week he gets a sharp strong pain he had to close his eye and squint and slow down,  no lacrimation or other autonomic symptoms. Lasts briefly 10 minutes not stabbing. The pain above the right eyebrown is continuous all day long and the forehead is dull. He wake up with the headaches, alleve and tylenol. Ringing in the ears. Worsening. No migrainous symptoms. Blurry.double vision right eye, can wake with the headaches and position makes it  worse.No other focal neurologic deficits, associated symptoms, inciting events or modifiable factors.  Reviewed notes, labs and imaging from outside physicians, which showed:  Reviewed [facility] notes. Patient was seen for an emergency visit. Chief complaint was feeling weak and a sore sensation in the right eye for 3 weeks. Headache above the right brow that last from several minutes to an hour. Resolves on its own not currently on treatment. Examination showed no APD, extraocular movements intact, external exam normal, intraocular pressure borderline, slit-lamp exam unremarkable, no optic nerve edema normal-appearing optic nerves, diagnosed with tension headache versus cluster headaches, pain in her around the eye, borderline glaucoma angle with borderline findings.  Months ago started having pain of her right eye went to eye doctor and underwent testing to determine eyes not the issue always a dull ache above his right eye once a week or every other week he will have worsened shooting pain, starting to have low-grade pain over both eyes he takes 2 Aleve's and 2 Tylenols twice daily also for bulging disc if he does not take those the pain is worse.    Observations/Objective:  Generalized: Well developed, in no acute distress  Mentation: Alert oriented to time, place, history taking. Follows all commands speech and language fluent   Assessment and Plan:  58 y.o. year old male  has a past medical history of Anxiety, Blepharitis, Bulging of thoracic intervertebral disc, non-insulin dependent diabetes mellitus, Hypercholesteremia, Hypertension, and Skin cancer. here with    ICD-10-CM   1. 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 He does not some grogginess with medication but feels that decreased pain outweighs side effects. He was referred to the headache clinic and is considering alternative treatment options. He is not interested in any  additional medications at this time. He was advised to continue current treatment plan. He will follow up in 1 year, sooner if needed. He verbalized understanding and agreement with plan.   No orders of the defined types were placed in this encounter.   No orders of the defined types were placed in this encounter.    Follow Up Instructions:  I discussed the assessment and treatment plan with the patient. The patient was provided an opportunity to ask questions and all were answered. The patient agreed with the plan and demonstrated an understanding of the instructions.   The patient was advised to call back or seek an in-person evaluation if the symptoms worsen or if the condition fails to improve as anticipated.  I provided 20 minutes of non-face-to-face time during this encounter.  Patient is located at his place of employment during video conference.  Provider is located in the office.   Debbora Presto, NP

## 2019-06-01 ENCOUNTER — Encounter: Payer: Self-pay | Admitting: Family Medicine

## 2019-06-01 ENCOUNTER — Telehealth (INDEPENDENT_AMBULATORY_CARE_PROVIDER_SITE_OTHER): Payer: 59 | Admitting: Family Medicine

## 2019-06-01 DIAGNOSIS — G500 Trigeminal neuralgia: Secondary | ICD-10-CM

## 2019-06-06 NOTE — Progress Notes (Signed)
Made any corrections needed, and agree with history, physical, neuro exam,assessment and plan as stated.     Tarvares Lant, MD [facility]  

## 2019-07-11 ENCOUNTER — Other Ambulatory Visit: Payer: Self-pay

## 2019-07-11 ENCOUNTER — Encounter (HOSPITAL_COMMUNITY): Payer: Self-pay | Admitting: Emergency Medicine

## 2019-07-11 ENCOUNTER — Emergency Department (HOSPITAL_COMMUNITY)
Admission: EM | Admit: 2019-07-11 | Discharge: 2019-07-11 | Disposition: A | Discharge status: Home / Self Care | Payer: 59 | Attending: Emergency Medicine | Admitting: Emergency Medicine

## 2019-07-11 ENCOUNTER — Emergency Department (HOSPITAL_COMMUNITY): Payer: 59

## 2019-07-11 DIAGNOSIS — F419 Anxiety disorder, unspecified: Secondary | ICD-10-CM | POA: Diagnosis not present

## 2019-07-11 DIAGNOSIS — E119 Type 2 diabetes mellitus without complications: Secondary | ICD-10-CM | POA: Insufficient documentation

## 2019-07-11 DIAGNOSIS — R1010 Upper abdominal pain, unspecified: Secondary | ICD-10-CM | POA: Insufficient documentation

## 2019-07-11 DIAGNOSIS — R198 Other specified symptoms and signs involving the digestive system and abdomen: Secondary | ICD-10-CM | POA: Diagnosis not present

## 2019-07-11 DIAGNOSIS — Z79899 Other long term (current) drug therapy: Secondary | ICD-10-CM | POA: Insufficient documentation

## 2019-07-11 DIAGNOSIS — I10 Essential (primary) hypertension: Secondary | ICD-10-CM | POA: Diagnosis not present

## 2019-07-11 DIAGNOSIS — Z85828 Personal history of other malignant neoplasm of skin: Secondary | ICD-10-CM | POA: Insufficient documentation

## 2019-07-11 LAB — CBC
HCT: 49.1 % (ref 39.0–52.0)
Hemoglobin: 16.6 g/dL (ref 13.0–17.0)
MCH: 28.5 pg (ref 26.0–34.0)
MCHC: 33.8 g/dL (ref 30.0–36.0)
MCV: 84.4 fL (ref 80.0–100.0)
Platelets: 243 10*3/uL (ref 150–400)
RBC: 5.82 MIL/uL — ABNORMAL HIGH (ref 4.22–5.81)
RDW: 12.9 % (ref 11.5–15.5)
WBC: 8.9 10*3/uL (ref 4.0–10.5)
nRBC: 0 % (ref 0.0–0.2)

## 2019-07-11 LAB — URINALYSIS, ROUTINE W REFLEX MICROSCOPIC
Bacteria, UA: NONE SEEN
Bilirubin Urine: NEGATIVE
Glucose, UA: NEGATIVE mg/dL
Ketones, ur: NEGATIVE mg/dL
Leukocytes,Ua: NEGATIVE
Nitrite: NEGATIVE
Protein, ur: NEGATIVE mg/dL
Specific Gravity, Urine: 1.012 (ref 1.005–1.030)
pH: 6 (ref 5.0–8.0)

## 2019-07-11 LAB — COMPREHENSIVE METABOLIC PANEL
ALT: 20 U/L (ref 0–44)
AST: 25 U/L (ref 15–41)
Albumin: 4.4 g/dL (ref 3.5–5.0)
Alkaline Phosphatase: 73 U/L (ref 38–126)
Anion gap: 12 (ref 5–15)
BUN: 15 mg/dL (ref 6–20)
CO2: 26 mmol/L (ref 22–32)
Calcium: 9.6 mg/dL (ref 8.9–10.3)
Chloride: 102 mmol/L (ref 98–111)
Creatinine, Ser: 1.24 mg/dL (ref 0.61–1.24)
GFR calc Af Amer: >60 mL/min (ref >60)
GFR calc non Af Amer: >60 mL/min (ref >60)
Glucose, Bld: 154 mg/dL — ABNORMAL HIGH (ref 70–99)
Potassium: 3.6 mmol/L (ref 3.5–5.1)
Sodium: 140 mmol/L (ref 135–145)
Total Bilirubin: 1.3 mg/dL — ABNORMAL HIGH (ref 0.3–1.2)
Total Protein: 7.8 g/dL (ref 6.5–8.1)

## 2019-07-11 LAB — LIPASE, BLOOD: Lipase: 42 U/L (ref 11–51)

## 2019-07-11 MED ORDER — LORAZEPAM 2 MG/ML IJ SOLN
1.0000 mg | Freq: Once | INTRAMUSCULAR | Status: AC
  Administered 2019-07-11: 1 mg via INTRAVENOUS
  Filled 2019-07-11: qty 1

## 2019-07-11 MED ORDER — SODIUM CHLORIDE 0.9% FLUSH
3.0000 mL | Freq: Once | INTRAVENOUS | Status: AC
  Administered 2019-07-11: 3 mL via INTRAVENOUS

## 2019-07-11 MED ORDER — IOHEXOL 300 MG/ML  SOLN
125.0000 mL | Freq: Once | INTRAMUSCULAR | Status: AC | PRN
  Administered 2019-07-11: 125 mL via INTRAVENOUS

## 2019-07-11 MED ORDER — CYCLOBENZAPRINE HCL 10 MG PO TABS: 10.0000 mg | ORAL_TABLET | Freq: Two times a day (BID) | ORAL | 0 refills | Status: AC | PRN

## 2019-07-11 NOTE — ED Provider Notes (Signed)
[facility] EMERGENCY DEPARTMENT Provider Note   CSN: [id number] Arrival date & time: 07/11/19  1715     History   Chief Complaint Chief Complaint  Patient presents with   Abdominal Pain    HPI Adam Ford is a 58 y.o. male.     58 y/o male with PMH anxiety, HTN, diabetes, gastric sleeve, T8 bulging disc presents to the ER for complaints of belly tightness. Patient reports that he has had waxing and waning episodes of tightening across his upper abdomen associated with SOB over the last week. These episodes come on randomly without exacerbating or relieving factors. He reports that he feels nauseated and like there is a tight band across his upper abdomen for several seconds to minutes. Today was the worst when it happened while driving and he had to pull over because he thought he would pass out. Denies chest pain, vomiting, diarrhea, fever, cough, syncope, DOE, leg swelling. Currently has no symptoms. Reports last episode was while in triage.      Past Medical History:  Diagnosis Date   Anxiety    Blepharitis    Bulging of thoracic intervertebral disc    T8   Hx of non-insulin dependent diabetes mellitus    Hypercholesteremia    Hypertension    Skin cancer     Patient Active Problem List   Diagnosis Date Noted   Supraorbital neuralgia 04/27/2018   Right sided temporal headache 04/27/2018    Past Surgical History:  Procedure Laterality Date   GALLBLADDER SURGERY     GASTRIC SLEEVE     lasers yag iridotomy Bilateral    LITHOTRIPSY     MOHS SURGERY     Dr. Pablo Ledger   NOSE SURGERY     Dr. Redmond Baseman; x2   TONSILLECTOMY     as a child    VASECTOMY          Home Medications    Prior to Admission medications   Medication Sig Start Date End Date Taking? Authorizing Provider  acetaminophen (TYLENOL) 500 MG tablet Take 1,000 mg by mouth 2 (two) times daily.    [provider]  amLODipine-benazepril (LOTREL) 10-40  MG capsule Take 1 capsule by mouth daily.  04/03/18   [provider]  cyclobenzaprine (FLEXERIL) 10 MG tablet Take 1 tablet (10 mg total) by mouth 2 (two) times daily as needed for up to 7 days for muscle spasms. 07/11/19 07/18/19  Madilyn Hook A, PA-C  gabapentin (NEURONTIN) 300 MG capsule Take 2 capsules (600 mg total) by mouth 3 (three) times daily. 01/07/19   Lomax, Amy, NP  metoprolol succinate (TOPROL-XL) 50 MG 24 hr tablet Take 50 mg by mouth 2 (two) times daily.  04/06/18   [provider]  Multiple Vitamin (MULTIVITAMIN) capsule Take 1 capsule by mouth daily.     [provider]  rosuvastatin (CRESTOR) 10 MG tablet Take 10 mg by mouth daily.  04/06/18   [provider]    Family History Family History  Problem Relation Age of Onset   Hypertension Mother    Hypertension Father    Aneurysm Father    Colon cancer Maternal Grandmother    Stroke Paternal Grandfather    Stroke Other    High Cholesterol Other        on both sides   Other Neg Hx     Social History Social History   Tobacco Use   Smoking status: Never Smoker   Smokeless tobacco: Never Used  Substance Use Topics   Alcohol use: Never    Frequency: Never   Drug use: Never     Allergies   Bee venom   Review of Systems Review of Systems  Constitutional: Negative for appetite change, chills and fever.  HENT: Negative.   Respiratory: Positive for shortness of breath. Negative for cough, chest tightness, wheezing and stridor.   Cardiovascular: Negative.  Negative for chest pain, palpitations and leg swelling.  Gastrointestinal: Positive for abdominal pain and nausea. Negative for abdominal distention, blood in stool, constipation, diarrhea, rectal pain and vomiting.  Genitourinary: Negative for dysuria.  Musculoskeletal: Negative for arthralgias and back pain.  Skin: Negative for rash.  Neurological: Positive for light-headedness. Negative for dizziness, syncope,  speech difficulty and headaches.     Physical Exam Updated Vital Signs BP (!) 171/96    Pulse 67    Temp 97.8 F (36.6 C) (Oral)    Resp 18    Ht 5\' 11"  (1.803 m)    Wt 122.5 kg    SpO2 100%    BMI 37.66 kg/m   Physical Exam Vitals signs and nursing note reviewed.  Constitutional:      General: He is not in acute distress.    Appearance: Normal appearance. He is well-developed. He is obese. He is not ill-appearing, toxic-appearing or diaphoretic.  HENT:     Head: Normocephalic.  Eyes:     Conjunctiva/sclera: Conjunctivae normal.  Cardiovascular:     Rate and Rhythm: Normal rate and regular rhythm.     Heart sounds: Normal heart sounds.  Pulmonary:     Effort: Pulmonary effort is normal.  Abdominal:     General: Abdomen is flat. Bowel sounds are normal. There is no distension.     Palpations: Abdomen is soft.     Tenderness: There is abdominal tenderness in the epigastric area. There is no right CVA tenderness or left CVA tenderness.  Skin:    General: Skin is dry.  Neurological:     General: No focal deficit present.     Mental Status: He is alert.  Psychiatric:        Mood and Affect: Mood normal.      ED Treatments / Results  Labs (all labs ordered are listed, but only abnormal results are displayed) Labs Reviewed  COMPREHENSIVE METABOLIC PANEL - Abnormal; Notable for the following components:      Result Value   Glucose, Bld 154 (*)    Total Bilirubin 1.3 (*)    All other components within normal limits  CBC - Abnormal; Notable for the following components:   RBC 5.82 (*)    All other components within normal limits  URINALYSIS, ROUTINE W REFLEX MICROSCOPIC - Abnormal; Notable for the following components:   Hgb urine dipstick SMALL (*)    All other components within normal limits  LIPASE, BLOOD    EKG EKG Interpretation  Date/Time:  Sunday July 11 2019 17:24:51 EDT Ventricular Rate:  79 PR Interval:  160 QRS Duration: 86 QT Interval:  382 QTC  Calculation: 438 R Axis:   -129 Text Interpretation:  Normal sinus rhythm Right superior axis deviation RSR' or QR pattern in V1 suggests right ventricular conduction delay Abnormal ECG No old tracing to compare Confirmed by Dorie Rank [phone]) on 07/11/2019 6:45:57 PM   Radiology Ct Abdomen Pelvis W Contrast  Result Date: 07/11/2019 CLINICAL DATA:  58 year old male with acute abdominal pain and nausea onset today. EXAM: CT ABDOMEN AND PELVIS WITH CONTRAST TECHNIQUE: Multidetector  CT imaging of the abdomen and pelvis was performed using the standard protocol following bolus administration of intravenous contrast. CONTRAST:  171mL OMNIPAQUE IOHEXOL 300 MG/ML  SOLN COMPARISON:  Pelvis ultrasound 06/22/2018. FINDINGS: Lower chest: Cardiac size at the upper limits of normal. No pericardial or pleural effusion. Mild dependent atelectasis. Hepatobiliary: Surgically absent gallbladder. Negative liver. No bile duct enlargement. Pancreas: Negative. Spleen: Negative. Adrenals/Urinary Tract: Normal adrenal glands. Left lower pole nephrolithiasis measuring up to 9 millimeters. No left hydronephrosis or perinephric stranding. Decompressed and negative left ureter. Multifocal right nephrolithiasis, bulky 12 millimeter midpole calculus. 7 millimeter lower pole calculus. 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 However, no right hydronephrosis, perinephric or periureteral stranding. The ureters may converge at some point, the ureters appear to converge at some point. The distal right ureter and UVJ appears normal. Unremarkable urinary bladder. Stomach/Bowel: Negative rectosigmoid colon. Retained stool in the descending colon and at the splenic flexure which otherwise are negative. Similar retained stool in the transverse colon. Negative right colon. Diminutive or absent appendix with no large bowel inflammation identified. Decompressed and negative terminal ileum. No dilated small bowel.  No small bowel mesenteric stranding. Postoperative changes to the stomach with a staple line all along the greater curve. Duodenum appears negative. No free air, free fluid. Vascular/Lymphatic: Mild Aortoiliac calcified atherosclerosis. Major arterial structures remain patent. Portal venous system appears to be patent. No lymphadenopathy. Reproductive: Questionable small fat containing left inguinal hernia. Lobulated appearance of the prostate involving the floor of the bladder. And the prostate is heterogeneous with dystrophic calcifications (sagittal image 71). Other: No pelvic free fluid. Musculoskeletal: No acute osseous abnormality identified. IMPRESSION: 1. 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 However, no obstructive uropathy. 2. Lobulated appearance of the prostate at the floor of the bladder. Recommend outpatient follow-up with urology. 3. No bowel inflammation or other acute process identified in the abdomen or pelvis. 4. Postoperative changes to the stomach. Electronically Signed   By: Genevie Ann M.D.   On: 07/11/2019 20:58    Procedures Procedures (including critical care time)  Medications Ordered in ED Medications  sodium chloride flush (NS) 0.9 % injection 3 mL (3 mLs Intravenous Given 07/11/19 2013)  LORazepam (ATIVAN) injection 1 mg (1 mg Intravenous Given 07/11/19 2012)  iohexol (OMNIPAQUE) 300 MG/ML solution 125 mL (125 mLs Intravenous Contrast Given 07/11/19 2028)     Initial Impression / Assessment and Plan / ED Course  I have reviewed the triage vital signs and the nursing notes.  Pertinent labs & imaging results that were available during my care of the patient were reviewed by me and considered in my medical decision making (see chart for details).  Clinical Course as of Jul 10 2114  Nancy Fetter Jul 11, 2019  1911 Patient presenting with tightness and spasms in the upper abdomen intermittantly over  the last week. No DOE, chest pain, leg swelling. Hx T8-9 bulging disc and GI surgery. No symptoms when I evaluate him. Labs normal. Plan to obtain CT abdomen. If normal, f/u GI and PMD, would consider trial of flexer rx for likely thoracic radicular pain/spasms. No concern for ACS/PE. Plan discussed with Dr. Tomi Bamberger and agreed upon. Ct scan negative for any acute, emergent findings. I did discuss the findings of the prostate and kidney stones with the patient and his wife. I offered them reassurance. I think his pain may be referred from his back and exacerbated from  his anxiety. Will send home with flexeril. Patient is much improved with ativan. D/c in good condition   [KM]    Clinical Course User Index [KM] Alveria Apley, PA-C       Based on review of vitals, medical screening exam, lab work and/or imaging, there does not appear to be an acute, emergent etiology for the patient's symptoms. Counseled pt on good return precautions and encouraged both PCP and ED follow-up as needed.  Prior to discharge, I also discussed incidental imaging findings with patient in detail and advised appropriate, recommended follow-up in detail.  Clinical Impression: 1. Pain of upper abdomen   2. Abdominal tightness     Disposition: Discharge  Prior to providing a prescription for a controlled substance, I independently reviewed the patient's recent prescription history on the [place]. The patient had no recent or regular prescriptions and was deemed appropriate for a brief, less than 3 day prescription of narcotic for acute analgesia.  This note was prepared with assistance of Systems analyst. Occasional wrong-word or sound-a-like substitutions may have occurred due to the inherent limitations of voice recognition software.   Final Clinical Impressions(s) / ED Diagnoses   Final diagnoses:  Pain of upper abdomen  Abdominal tightness    ED  Discharge Orders         Ordered    cyclobenzaprine (FLEXERIL) 10 MG tablet  2 times daily PRN     07/11/19 2114           Kristine Royal 07/11/19 2115    Dorie Rank, MD 07/14/19 [phone]

## 2019-07-11 NOTE — Discharge Instructions (Addendum)
You were seen today for episodes of abdominal tightness. Your labs looked very good and were reassuring. Your CT scan showed no sign of any acute problem. Likely, you have pain referred from your back and this is exacerbated by anxiety. We will give you a muscle relaxant to try. You also had an abnormal appearing prostate on your CT scan and should follow up with your primary care provider or urologist. I have given you the name and info of a radiologist for you to follow up with. Thank you for allowing me to care for you today. Please return to the emergency department if you have new or worsening symptoms. Take your medications as instructed.

## 2019-07-11 NOTE — ED Notes (Signed)
Patient verbalizes understanding of discharge instructions. Opportunity for questioning and answers were provided.  pt discharged from ED with wife, ambulatory by self.

## 2019-07-11 NOTE — ED Triage Notes (Signed)
Pt here from with c/o  abd pain and some sob , pt states that this has been going on for a few days, nad in triage

## 2019-07-15 ENCOUNTER — Encounter: Payer: Self-pay | Admitting: Family Medicine

## 2019-11-08 ENCOUNTER — Encounter: Payer: Self-pay | Admitting: Family Medicine

## 2019-12-20 ENCOUNTER — Emergency Department (HOSPITAL_COMMUNITY): Payer: 59

## 2019-12-20 ENCOUNTER — Other Ambulatory Visit: Payer: Self-pay

## 2019-12-20 ENCOUNTER — Emergency Department (HOSPITAL_COMMUNITY)
Admission: EM | Admit: 2019-12-20 | Discharge: 2019-12-21 | Disposition: A | Discharge status: Home / Self Care | Payer: 59 | Attending: Emergency Medicine | Admitting: Emergency Medicine

## 2019-12-20 ENCOUNTER — Ambulatory Visit: Payer: 59 | Admitting: Family Medicine

## 2019-12-20 ENCOUNTER — Encounter: Payer: Self-pay | Admitting: Family Medicine

## 2019-12-20 VITALS — BP 164/88 | HR 66 | Temp 97.5°F | Ht 70.0 in | Wt 273.0 lb

## 2019-12-20 DIAGNOSIS — Z6839 Body mass index (BMI) 39.0-39.9, adult: Secondary | ICD-10-CM

## 2019-12-20 DIAGNOSIS — R471 Dysarthria and anarthria: Secondary | ICD-10-CM

## 2019-12-20 DIAGNOSIS — G500 Trigeminal neuralgia: Secondary | ICD-10-CM

## 2019-12-20 DIAGNOSIS — I10 Essential (primary) hypertension: Secondary | ICD-10-CM | POA: Insufficient documentation

## 2019-12-20 DIAGNOSIS — Z79899 Other long term (current) drug therapy: Secondary | ICD-10-CM | POA: Diagnosis not present

## 2019-12-20 DIAGNOSIS — R0689 Other abnormalities of breathing: Secondary | ICD-10-CM | POA: Diagnosis not present

## 2019-12-20 DIAGNOSIS — R4182 Altered mental status, unspecified: Secondary | ICD-10-CM | POA: Diagnosis present

## 2019-12-20 DIAGNOSIS — R198 Other specified symptoms and signs involving the digestive system and abdomen: Secondary | ICD-10-CM

## 2019-12-20 DIAGNOSIS — R479 Unspecified speech disturbances: Secondary | ICD-10-CM

## 2019-12-20 LAB — CBC
HCT: 49.3 % (ref 39.0–52.0)
Hemoglobin: 16.2 g/dL (ref 13.0–17.0)
MCH: 28.1 pg (ref 26.0–34.0)
MCHC: 32.9 g/dL (ref 30.0–36.0)
MCV: 85.4 fL (ref 80.0–100.0)
Platelets: 255 10*3/uL (ref 150–400)
RBC: 5.77 MIL/uL (ref 4.22–5.81)
RDW: 12.7 % (ref 11.5–15.5)
WBC: 7.5 10*3/uL (ref 4.0–10.5)
nRBC: 0 % (ref 0.0–0.2)

## 2019-12-20 LAB — BASIC METABOLIC PANEL
Anion gap: 11 (ref 5–15)
BUN: 19 mg/dL (ref 6–20)
CO2: 27 mmol/L (ref 22–32)
Calcium: 9.5 mg/dL (ref 8.9–10.3)
Chloride: 100 mmol/L (ref 98–111)
Creatinine, Ser: 1.14 mg/dL (ref 0.61–1.24)
GFR calc Af Amer: >60 mL/min (ref >60)
GFR calc non Af Amer: >60 mL/min (ref >60)
Glucose, Bld: 132 mg/dL — ABNORMAL HIGH (ref 70–99)
Potassium: 4 mmol/L (ref 3.5–5.1)
Sodium: 138 mmol/L (ref 135–145)

## 2019-12-20 LAB — MAGNESIUM: Magnesium: 2.2 mg/dL (ref 1.7–2.4)

## 2019-12-20 LAB — LIPASE, BLOOD: Lipase: 36 U/L (ref 11–51)

## 2019-12-20 LAB — CBG MONITORING, ED: Glucose-Capillary: 99 mg/dL (ref 70–99)

## 2019-12-20 MED ORDER — IOHEXOL 350 MG/ML SOLN
75.0000 mL | Freq: Once | INTRAVENOUS | Status: AC | PRN
  Administered 2019-12-20: 75 mL via INTRAVENOUS

## 2019-12-20 NOTE — Progress Notes (Addendum)
PATIENT: Adam Ford DOB: 1961/03/05  REASON FOR VISIT: follow up HISTORY FROM: patient  Chief Complaint  Patient presents with  . Follow-up    Rm6. alone. yearly f/u Speech difficulty. Still having headaches. States if he doesnt have the gabapentin he has a headache.     HISTORY OF PRESENT ILLNESS: Today 12/20/19 Adam Ford is a 59 y.o. male here today for follow up for supraoccipital neuralgia. He continues gabapentin. He is taking 600mg  2-3 times daily. He does feel that gabapentin helps as if he misses a dose, pain is increased. He continues to have sharp stabbing pain over the right eye.   Today, in the office, he is having difficulty speaking, difficulty breathing, reports a feeling that his abdomen is tight. He denies chest pain. He feels slow in reaction time and weak. Symptoms started about an hour ago. She does have a headache today but not out of proportion to other headaches. He feels better when he is able to stretch out. He reports having intermittent symptoms starting in 06/2019 but over the past month these symptoms have occurred multiple times daily and are intensifying. He denies obvious weakness. He was able to work today and drove to his appointment. He was seen for a similar event in 06/2019 but was not having difficulty speaking, reported difficulty getting deep breath and abdominal tightness. CT abdomen showed kidney stones. He was seen by PCP a couple of weeks ago following my recommendation via Mychart message for similar complaints and started on duloxetine. He did not feel it helped and discontinued this about a week ago.  He has not had recent head imaging since symptoms started. Last MR brain, MR angio and MR orbits unremarkable in 04/2018.    HISTORY: (copied from my note on 06/01/2019)  Adam Ford is a 59 y.o. male here today for follow up of suborbital headaches. He was started on amitriptyline 25mg  at bedtime at last visit in 04/2019. Unfortunately,  he was unable to tolerate side effects. He has continues gabapentin 300mg  up to five times daily during the week and 600mg  three times daily on the weekends. He does feel a little sluggish with medication but feels headaches are stable. He continues to have intermittent pain about 1-2 times daily. Pain is just over the right eye. It resolves in a few minutes to an hour. No other symptoms associated with pain.  It is much better on medication.   History (copied from my note on 04/22/2019)  Adam Ford a 59 y.o.malehere today for follow up of suborbital headaches. He has been taking gabapentin 300mg  in the am, 300mg  at lunch and 600mg  at bedtime. He reported grogginess with 600mg  TID dosing. He was doing well untl 3-4 days ago when headaches returned and are refractory to treatment. He has increased gabapentin to 600mg  TID with an extra 300mg  dose around 5pm.He reports no changes in the headache other than this is more of a dull achy pain rather than sharp stabbing pain. He reports pain above his right eye as in the past. He has not noticed significant light or sound sensitivity but does report "feeling better when I wear my sunglasses." He denies nausea or vomiting. No vision changes. He reports that headache will spontaneously resolve with time.  HISTORY(copied from my note on 12/17/2018)  Adam Ford a 59 y.o.malehere today for follow up for supraoccipital neuralgia. He continues Gabapentin 600mg  up to 3 times a day.He has tried carbamazepine and topiramate in the past that  caused side effects. He feels that gabapentin is working better than anything. He does have concerns of feeling groggy with gabapentin. He has taken Lyrica in the past with similar side effects. He is trying to adjust his dose where he doesn't feel as sleepy.   He was seen for consideration of ablation therapy and nerve stimulator but he is not interested at this time.  HISTORY: (copied fromDr Adam Ford'snote on  04/23/2018) [name] a 59 y.o.malehere as a referral from Dr. Peggye Ford eye pain rule out cluster headaches. Past medical history anxiety, hypertension, high cholesterol. Back in February after the new year he had a regular eye check and a month later he had pain over his right eye and headaches. Headaches worsened. He saw ophtho again Dr. Patrice Paradise and everything was fine. Dr. Redmond Baseman performed sinus procedures in February in the same timeframe the headaches started but unclear. Headaches worsening and a dull pain over the forehead. Mostly on the right, right at one point (points to the supratrochlear nerve). Every other week he gets a sharp strong pain he had to close his eye and squint and slow down, no lacrimation or other autonomic symptoms. Lasts briefly 10 minutes not stabbing. The pain above the right eyebrown is continuous all day long and the forehead is dull. He wake up with the headaches, alleve and tylenol. Ringing in the ears. Worsening. No migrainous symptoms. Blurry.double vision right eye, can wake with the headaches and position makes it worse.No other focal neurologic deficits, associated symptoms, inciting events or modifiable factors.  Reviewed notes, labs and imaging from outside physicians, which showed:  Reviewed [facility]. Patient was seen for an emergency visit. Chief complaint was feeling weak and a sore sensation in the right eye for 3 weeks. Headache above the right brow that last from several minutes to an hour. Resolves on its own not currently on treatment. Examination showed no APD, extraocular movements intact, external exam normal, intraocular pressure borderline, slit-lamp exam unremarkable, no optic nerve edema normal-appearing optic nerves, diagnosed with tension headache versus cluster headaches, pain in her around the eye, borderline glaucoma angle with borderline findings.  Months ago started having pain of her right eye went to eye  doctor and underwent testing to determine eyes not the issue always a dull ache above his right eye once a week or every other week he will have worsened shooting pain, starting to have low-grade pain over both eyes he takes 2 Aleve's and 2 Tylenols twice daily also for bulging disc if he does not take those the pain is worse.   REVIEW OF SYSTEMS: Out of a complete 14 system review of symptoms, the patient complains only of the following symptoms, see hpi and all other reviewed systems are negative.  ALLERGIES: Allergies  Allergen Reactions  . Bee Venom Anaphylaxis    HOME MEDICATIONS: Outpatient Medications Prior to Visit  Medication Sig Dispense Refill  . amLODipine-benazepril (LOTREL) 10-40 MG capsule Take 1 capsule by mouth daily.     Marland Kitchen gabapentin (NEURONTIN) 300 MG capsule Take 2 capsules (600 mg total) by mouth 3 (three) times daily. 540 capsule 3  . metFORMIN (GLUCOPHAGE-XR) 500 MG 24 hr tablet Take 500 mg by mouth daily.    . metoprolol succinate (TOPROL-XL) 50 MG 24 hr tablet Take 50 mg by mouth 2 (two) times daily.     . Multiple Vitamin (MULTIVITAMIN) capsule Take 1 capsule by mouth daily.     . rosuvastatin (CRESTOR) 10 MG tablet Take  10 mg by mouth daily.     Marland Kitchen acetaminophen (TYLENOL) 500 MG tablet Take 1,000 mg by mouth 2 (two) times daily.     No facility-administered medications prior to visit.    PAST MEDICAL HISTORY: Past Medical History:  Diagnosis Date  . Anxiety   . Blepharitis   . Bulging of thoracic intervertebral disc    T8  . Hx of non-insulin dependent diabetes mellitus   . Hypercholesteremia   . Hypertension   . Skin cancer     PAST SURGICAL HISTORY: Past Surgical History:  Procedure Laterality Date  . GALLBLADDER SURGERY    . GASTRIC SLEEVE    . lasers yag iridotomy Bilateral   . LITHOTRIPSY    . MOHS SURGERY     Dr. Pablo Ledger  . NOSE SURGERY     Dr. Redmond Baseman; x2  . TONSILLECTOMY     as a child   . VASECTOMY      FAMILY HISTORY: Family  History  Problem Relation Age of Onset  . Hypertension Mother   . Hypertension Father   . Aneurysm Father   . Colon cancer Maternal Grandmother   . Stroke Paternal Grandfather   . Stroke Other   . High Cholesterol Other        on both sides  . Other Neg Hx     SOCIAL HISTORY: Social History   Socioeconomic History  . Marital status: Married    Spouse name: Not on file  . Number of children: 4  . Years of education: Not on file  . Highest education level: Master's degree (e.g., MA, MS, MEng, MEd, MSW, MBA)  Occupational History  . Not on file  Tobacco Use  . Smoking status: Never Smoker  . Smokeless tobacco: Never Used  Substance and Sexual Activity  . Alcohol use: Never  . Drug use: Never  . Sexual activity: Not on file  Other Topics Concern  . Not on file  Social History Narrative   Lives at home with his wife & children   Right handed   Caffeine: quit in 2015 but now drinks diet soda which helps his headaches. He drinks about 6-7 cans during the work days.    Social Determinants of Health   Financial Resource Strain:   . Difficulty of Paying Living Expenses: Not on file  Food Insecurity:   . Worried About Charity fundraiser in the Last Year: Not on file  . Ran Out of Food in the Last Year: Not on file  Transportation Needs:   . Lack of Transportation (Medical): Not on file  . Lack of Transportation (Non-Medical): Not on file  Physical Activity:   . Days of Exercise per Week: Not on file  . Minutes of Exercise per Session: Not on file  Stress:   . Feeling of Stress : Not on file  Social Connections:   . Frequency of Communication with Friends and Family: Not on file  . Frequency of Social Gatherings with Friends and Family: Not on file  . Attends Religious Services: Not on file  . Active Member of Clubs or Organizations: Not on file  . Attends Archivist Meetings: Not on file  . Marital Status: Not on file  Intimate Partner Violence:   . Fear  of Current or Ex-Partner: Not on file  . Emotionally Abused: Not on file  . Physically Abused: Not on file  . Sexually Abused: Not on file     PHYSICAL EXAM  Vitals:  12/20/19 1513  BP: (!) 164/88  Pulse: 66  Temp: (!) 97.5 F (36.4 C)  Weight: 273 lb (123.8 kg)  Height: 5\' 10"  (1.778 m)   Body mass index is 39.17 kg/m.  Generalized: Well developed Cardiology: normal rate and rhythm, no murmur noted Respiratory: clear to auscultation bilaterally, labored breathing but not diaphoretic  Neurological examination  Mentation: Alert but slow to respond, having difficulty getting words, he is oriented to time, place, history taking. Follows all commands.  Cranial nerve II-XII: Pupils were equal round reactive to light. Extraocular movements were full, visual field were full on confrontational test. Facial sensation and strength were normal. Uvula tongue midline. Head turning and shoulder shrug  were normal and symmetric. Motor: The motor testing reveals 5 over 5 strength of all 4 extremities. Good symmetric motor tone is noted throughout.  Sensory: Sensory testing is intact to soft touch on all 4 extremities. No evidence of extinction is noted.  Coordination: Cerebellar testing reveals good finger-nose-finger and heel-to-shin bilaterally.  Gait and station: unable to assess as patient unable to stand in office, reports need to stretch out in chair to breath   DIAGNOSTIC DATA (LABS, IMAGING, TESTING) - I reviewed patient records, labs, notes, testing and imaging myself where available.  No flowsheet data found.   Lab Results  Component Value Date   WBC 8.9 07/11/2019   HGB 16.6 07/11/2019   HCT 49.1 07/11/2019   MCV 84.4 07/11/2019   PLT 243 07/11/2019      Component Value Date/Time   NA 140 07/11/2019 1733   NA 142 04/23/2018 0902   K 3.6 07/11/2019 1733   CL 102 07/11/2019 1733   CO2 26 07/11/2019 1733   GLUCOSE 154 (H) 07/11/2019 1733   BUN 15 07/11/2019 1733   BUN  18 04/23/2018 0902   CREATININE 1.24 07/11/2019 1733   CALCIUM 9.6 07/11/2019 1733   PROT 7.8 07/11/2019 1733   PROT 7.0 04/23/2018 0902   ALBUMIN 4.4 07/11/2019 1733   ALBUMIN 4.5 04/23/2018 0902   AST 25 07/11/2019 1733   ALT 20 07/11/2019 1733   ALKPHOS 73 07/11/2019 1733   BILITOT 1.3 (H) 07/11/2019 1733   BILITOT 0.8 04/23/2018 0902   GFRNONAA >60 07/11/2019 1733   GFRAA >60 07/11/2019 1733   No results found for: CHOL, HDL, LDLCALC, LDLDIRECT, TRIG, CHOLHDL No results found for: HGBA1C No results found for: VITAMINB12 Lab Results  Component Value Date   TSH 0.956 04/23/2018       ASSESSMENT AND PLAN 59 y.o. year old male  has a past medical history of Anxiety, Blepharitis, Bulging of thoracic intervertebral disc, non-insulin dependent diabetes mellitus, Hypercholesteremia, Hypertension, and Skin cancer. here with     ICD-10-CM   1. 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 Difficulty with speech  R47.9   3. Difficulty breathing  R06.89   4. Abdominal tightness  R19.8   5. BMI 39.0-39.9,adult  Z68.39     Adam presents today for follow-up for supraorbital neuralgia.  Unfortunately, he is having difficulty in the office today.  He reports difficulty speaking and having trouble getting his words out.  He reports abdominal tightness and difficulty breathing.  He is having to stretch his upper torso in order to feel that he can take a deep breath.  He does have a headache today but states it is not out of proportion to typical headaches.  He denies chest pain, dizziness or lightheadedness.  Neuro exam is intact.  Vital signs  are unremarkable with the exception of an elevated blood pressure of 164/88.  Patient reports worsening symptoms over the past month.  We have discussed potential, nonlife-threatening causes of symptoms including anxiety, panic attacks or complicated migraine. Patient has history of type 2 diabetes, hypertension, hyperlipidemia and obesity.  We are unable to perform  an EKG in the office.  Due to presentation of symptoms and concerns for potential cardiac risk factors, I have recommended an ER evaluation urgently.  EMS called and was transported to [facility].  I have called his wife to update her.  We will continue current treatment regimen for now.  We will follow-up pending ER evaluation.  He verbalizes understanding and agreement with plan.   No orders of the defined types were placed in this encounter.    No orders of the defined types were placed in this encounter.     I spent 45 minutes with the patient. 50% of this time was spent counseling and educating patient on plan of care and medications.    Debbora Presto, FNP-C 12/20/2019, 4:41 PM [facility] [address] [place], [place] [address] [phone]  Made any corrections needed, and agree with history, physical, neuro exam,assessment and plan as stated.     Sarina Ill, MD [facility]

## 2019-12-20 NOTE — ED Provider Notes (Signed)
[facility] EMER[facility]ENCY DEPARTMENT Provider Note   CSN: [id number] Arrival date & time: 12/20/19  1629     History Chief Complaint  Patient presents with  . Altered Mental Status    Adam Ford is a 59 y.o. male.  HPI Pt is a 59 yo M with a PMH of anxiety, DM, HLD, HTN presenting to the ED today due to dysarthria. Pt reports that he has had similar episodes since September of last year and that they have been increasing in frequency. He says that he was sitting in his neurologist's waiting room prior to being seen when he experienced sudden onset of epigastric "tightness" and difficulty speaking. He was sent here by his neurologist for further evaluation. He says that he is unsure what incites these events but that they usually resolve on their own. He endorses mild frontal HA; denies nausea, vomiting, change in vision, ataxia, weakness.    Past Medical History:  Diagnosis Date  . Anxiety   . Blepharitis   . Bulging of thoracic intervertebral disc    T8  . Hx of non-insulin dependent diabetes mellitus   . Hypercholesteremia   . Hypertension   . Skin cancer     Patient Active Problem List   Diagnosis Date Noted  . 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 Right sided temporal headache 04/27/2018    Past Surgical History:  Procedure Laterality Date  . GALLBLADDER SURGERY    . GASTRIC SLEEVE    . lasers yag iridotomy Bilateral   . LITHOTRIPSY    . MOHS SURGERY     Dr. Pablo Ledger  . NOSE SURGERY     Dr. Redmond Baseman; x2  . TONSILLECTOMY     as a child   . VASECTOMY         Family History  Problem Relation Age of Onset  . Hypertension Mother   . Hypertension Father   . Aneurysm Father   . Colon cancer Maternal Grandmother   . Stroke Paternal Grandfather   . Stroke Other   . High Cholesterol Other        on both sides  . Other Neg Hx     Social History   Tobacco Use  . Smoking status: Never Smoker  . Smokeless tobacco: Never Used  Substance Use  Topics  . Alcohol use: Never  . Drug use: Never    Home Medications Prior to Admission medications   Medication Sig Start Date End Date Taking? Authorizing Provider  amLODipine-benazepril (LOTREL) 10-40 MG capsule Take 1 capsule by mouth daily.  04/03/18   [provider]  gabapentin (NEURONTIN) 300 MG capsule Take 2 capsules (600 mg total) by mouth 3 (three) times daily. 01/07/19   Lomax, Amy, NP  metFORMIN (GLUCOPHAGE-XR) 500 MG 24 hr tablet Take 500 mg by mouth daily. 12/14/19   [provider]  metoprolol succinate (TOPROL-XL) 50 MG 24 hr tablet Take 50 mg by mouth 2 (two) times daily.  04/06/18   [provider]  Multiple Vitamin (MULTIVITAMIN) capsule Take 1 capsule by mouth daily.     [provider]  rosuvastatin (CRESTOR) 10 MG tablet Take 10 mg by mouth daily.  04/06/18   [provider]    Allergies    Bee venom  Review of Systems   Review of Systems  Constitutional: Negative for chills and fever.  HENT: Negative for ear pain and sore throat.   Eyes: Negative for pain and visual disturbance.  Respiratory: Negative for cough and  shortness of breath.   Cardiovascular: Negative for chest pain and palpitations.  Gastrointestinal: Negative for abdominal pain and vomiting.  Genitourinary: Negative for dysuria and hematuria.  Musculoskeletal: Negative for arthralgias and back pain.  Skin: Negative for color change and rash.  Neurological: Positive for speech difficulty and headaches. Negative for seizures, syncope and weakness.  Psychiatric/Behavioral: Negative for agitation.  All other systems reviewed and are negative.   Physical Exam Updated Vital Signs BP (!) 168/82 (BP Location: Left Arm)   Pulse 78   Temp 97.9 F (36.6 C) (Oral)   Resp 17   SpO2 99%   Physical Exam Vitals and nursing note reviewed.  Constitutional:      General: He is not in acute distress.    Appearance: He is well-developed. He is not ill-appearing,  toxic-appearing or diaphoretic.  HENT:     Head: Normocephalic and atraumatic.     Right Ear: External ear normal.     Left Ear: External ear normal.     Nose: Nose normal. No congestion or rhinorrhea.     Mouth/Throat:     Mouth: Mucous membranes are moist.     Pharynx: Oropharynx is clear.  Eyes:     Extraocular Movements: Extraocular movements intact.     Conjunctiva/sclera: Conjunctivae normal.     Pupils: Pupils are equal, round, and reactive to light.  Cardiovascular:     Rate and Rhythm: Normal rate and regular rhythm.  Pulmonary:     Effort: Pulmonary effort is normal. No respiratory distress.     Breath sounds: Normal breath sounds. No stridor. No wheezing, rhonchi or rales.  Abdominal:     General: There is no distension.     Palpations: Abdomen is soft.     Tenderness: There is no abdominal tenderness.  Musculoskeletal:        General: No signs of injury. Normal range of motion.     Cervical back: Normal range of motion and neck supple.  Skin:    General: Skin is warm and dry.     Capillary Refill: Capillary refill takes less than 2 seconds.  Neurological:     Mental Status: He is alert and oriented to person, place, and time.     Cranial Nerves: No cranial nerve deficit.     Sensory: No sensory deficit.     Motor: No weakness.     Coordination: Coordination normal.     Gait: Gait normal.     Comments: AAOx4. Pt's speech is appropriate but it is slowed and choppy. No CN deficits note. Full strength and sensation in extremities. No difficulty with finger to nose.      ED Results / Procedures / Treatments   Labs (all labs ordered are listed, but only abnormal results are displayed) Labs Reviewed  BASIC METABOLIC PANEL - Abnormal; Notable for the following components:      Result Value   Glucose, Bld 132 (*)    All other components within normal limits  CBC  MAGNESIUM  LIPASE, BLOOD    EKG None  Radiology CT Angio Head W or Wo Contrast  Result Date:  12/20/2019 CLINICAL DATA:  Carotid artery stenosis EXAM: CT ANGIOGRAPHY HEAD AND NECK TECHNIQUE: Multidetector CT imaging of the head and neck was performed using the standard protocol during bolus administration of intravenous contrast. Multiplanar CT image reconstructions and MIPs were obtained to evaluate the vascular anatomy. Carotid stenosis measurements (when applicable) are obtained utilizing NASCET criteria, using the distal internal carotid diameter as  the denominator. CONTRAST:  28mL OMNIPAQUE IOHEXOL 350 MG/ML SOLN COMPARISON:  None. FINDINGS: CT HEAD FINDINGS Brain: There is no acute intracranial hemorrhage, mass-effect, or edema. Gray-white differentiation is preserved. There is no extra-axial fluid collection. Ventricles and sulci are within normal limits in size and configuration. Vascular: No hyperdense vessel. Skull: Calvarium is unremarkable. Sinuses/Orbits: No acute finding. Other: None. Review of the MIP images confirms the above findings CTA NECK FINDINGS Aortic arch: Great vessel origins are patent. There is direct origin of the left vertebral artery from the arch. Right carotid system: Patent. There is no measurable stenosis or evidence of dissection. Left carotid system: Patent. Trace calcified plaque at the bifurcation. There is no measurable stenosis or evidence of dissection. Vertebral arteries: Patent. Right vertebral artery is dominant. No measurable stenosis or evidence of dissection. Skeleton: Mild degenerative changes of the cervical spine. Other neck: No neck mass or adenopathy. Upper chest: No apical lung mass. Review of the MIP images confirms the above findings CTA HEAD FINDINGS Anterior circulation: Intracranial internal carotid arteries are patent. Anterior and middle cerebral arteries are patent. Posterior circulation: Intracranial vertebral arteries, basilar artery, and posterior cerebral arteries are patent. Venous sinuses: As permitted by contrast timing, patent. Review of  the MIP images confirms the above findings IMPRESSION: No acute intracranial abnormality. No large vessel occlusion, hemodynamically significant stenosis, or evidence of dissection Electronically Signed   By: Macy Mis M.D.   On: 12/20/2019 20:34   CT Angio Neck W and/or Wo Contrast  Result Date: 12/20/2019 CLINICAL DATA:  Carotid artery stenosis EXAM: CT ANGIOGRAPHY HEAD AND NECK TECHNIQUE: Multidetector CT imaging of the head and neck was performed using the standard protocol during bolus administration of intravenous contrast. Multiplanar CT image reconstructions and MIPs were obtained to evaluate the vascular anatomy. Carotid stenosis measurements (when applicable) are obtained utilizing NASCET criteria, using the distal internal carotid diameter as the denominator. CONTRAST:  65mL OMNIPAQUE IOHEXOL 350 MG/ML SOLN COMPARISON:  None. FINDINGS: CT HEAD FINDINGS Brain: There is no acute intracranial hemorrhage, mass-effect, or edema. Gray-white differentiation is preserved. There is no extra-axial fluid collection. Ventricles and sulci are within normal limits in size and configuration. Vascular: No hyperdense vessel. Skull: Calvarium is unremarkable. Sinuses/Orbits: No acute finding. Other: None. Review of the MIP images confirms the above findings CTA NECK FINDINGS Aortic arch: Great vessel origins are patent. There is direct origin of the left vertebral artery from the arch. Right carotid system: Patent. There is no measurable stenosis or evidence of dissection. Left carotid system: Patent. Trace calcified plaque at the bifurcation. There is no measurable stenosis or evidence of dissection. Vertebral arteries: Patent. Right vertebral artery is dominant. No measurable stenosis or evidence of dissection. Skeleton: Mild degenerative changes of the cervical spine. Other neck: No neck mass or adenopathy. Upper chest: No apical lung mass. Review of the MIP images confirms the above findings CTA HEAD FINDINGS  Anterior circulation: Intracranial internal carotid arteries are patent. Anterior and middle cerebral arteries are patent. Posterior circulation: Intracranial vertebral arteries, basilar artery, and posterior cerebral arteries are patent. Venous sinuses: As permitted by contrast timing, patent. Review of the MIP images confirms the above findings IMPRESSION: No acute intracranial abnormality. No large vessel occlusion, hemodynamically significant stenosis, or evidence of dissection Electronically Signed   By: Macy Mis M.D.   On: 12/20/2019 20:34   MR BRAIN WO CONTRAST  Result Date: 12/20/2019 CLINICAL DATA:  Initial evaluation for intermittent dysarthria. Evaluate for stroke. EXAM: MRI HEAD WITHOUT CONTRAST  TECHNIQUE: Multiplanar, multiecho pulse sequences of the brain and surrounding structures were obtained without intravenous contrast. COMPARISON:  Prior CTA from earlier the same day. FINDINGS: Brain: Cerebral volume within normal limits for age. Few scattered subcentimeter foci of T2/FLAIR hyperintensity noted involving the supratentorial cerebral white matter, nonspecific, but felt to be within normal limits for age. No abnormal foci of restricted diffusion to suggest acute or subacute ischemia. Gray-white matter differentiation maintained. No encephalomalacia to suggest chronic cortical infarction. No foci of susceptibility artifact to suggest acute or chronic intracranial hemorrhage. Approximate 3 cm benign arachnoid cyst noted at the anterior left middle cranial fossa. No other mass lesion, midline shift, or mass effect. No hydrocephalus. No extra-axial fluid collection. Pituitary gland within normal limits. Midline structures intact. Vascular: Major intracranial vascular flow voids are maintained. Skull and upper cervical spine: Craniocervical junction within normal limits. Bone marrow signal intensity normal. No scalp soft tissue abnormality. Sinuses/Orbits: Globes normal soft tissues within  normal limits. Paranasal sinuses are largely clear. Trace bilateral mastoid effusions noted, of doubtful significance. Inner ear structures grossly normal. Other: None. IMPRESSION: 1. No acute intracranial abnormality. 2. Approximate 3 cm benign arachnoid cyst at the anterior left middle cranial fossa. 3. Otherwise unremarkable brain MRI for age. Electronically Signed   By: Jeannine Boga M.D.   On: 12/20/2019 23:24    Procedures Procedures (including critical care time)  Medications Ordered in ED Medications  iohexol (OMNIPAQUE) 350 MG/ML injection 75 mL (75 mLs Intravenous Contrast Given 12/20/19 2015)    ED Course  I have reviewed the triage vital signs and the nursing notes.  Pertinent labs & imaging results that were available during my care of the patient were reviewed by me and considered in my medical decision making (see chart for details).    MDM Rules/Calculators/A&P                     Pt is a 59 yo M with a PMH of anxiety, DM, HLD, HTN presenting to the ED today due to dysarthria. On exam, pt has slow but choppy speech. VSS. Afebrile.  On arrival, pt appears generally well and is displaying no signs of acute distress. Although his speech is slowed, he has no disorientation or neuro deficits. He denies any recent trauma or injury. Pt says that these episodes have been occurring more frequently since September. Low initial suspicion for acute stroke as his symptoms are consistent with previous. Obtained CTA head/neck which were negative for large vessel occlusions, significant stenosis or dissection. CBC, BMP, mag and lipase unremarkable. Discussed pt's case with neurology physician on call who recommended MRI brain to evaluate for organic cause of pt's symptoms such as posterior stroke.   MRI with no acute intracranial abnormality. He does have a 3cm benign arachnoid cyst at the anterior L middle crania fossa. Low suspicion that this cyst is causing pt's symptoms. There is  suspicion for conversion disorder. On reassessment, pt's dysarthria has resolved and he says that he feels completely normal. Encouraged pt to f/u with his neurologist for further evaluation. No further w/u or intervention in the ED today.  At this time, pt discharged home. Discussed return precautions. Pt in agreement with plan.   Pt assessed and evaluated with Dr. Dayna Barker.  Nadeen Landau, MD  Final Clinical Impression(s) / ED Diagnoses Final diagnoses:  Dysarthria    Rx / DC Orders ED Discharge Orders    None       Nadeen Landau, MD 12/20/19  [id number]    Merrily Pew, MD 12/24/19 [phone]

## 2019-12-20 NOTE — ED Notes (Signed)
[phone] pts wife Cecille Rubin wants an update

## 2019-12-20 NOTE — ED Triage Notes (Signed)
Pt arrives by EMS with complaints of epigastric pain and tightness accompanianied by dysphagia . First episode occurred in Sept. Today was at neurologist when he became "whoozy" and the episode began.   Alert and oriented X4 177/97 P: 94 RR 16 CBG 166 T:99.1

## 2019-12-20 NOTE — ED Notes (Signed)
Pt back from CT without incidence 

## 2019-12-20 NOTE — ED Notes (Signed)
Pt taken to and from MRI without incidence

## 2019-12-20 NOTE — ED Notes (Signed)
Pt taken to CT scan in NAD 

## 2019-12-20 NOTE — ED Notes (Signed)
Assumed care of pt. Pt alert, resting on cart in NAD. Breathing easy, non-labored. VSS on monitors. Call light within reach. Hourly rounds completed. Will continue to monitor

## 2019-12-21 ENCOUNTER — Encounter: Payer: Self-pay | Admitting: Family Medicine

## 2019-12-21 NOTE — ED Notes (Signed)
Patient verbalizes understanding of discharge instructions and follow up care. Opportunity for questioning and answers were provided. All questions answered completely. PIV removed, catheter intact. Site dressed with gauze and tape. Armband removed by staff, pt discharged from ED. Ambulatory with strong, steady gait

## 2019-12-21 NOTE — ED Notes (Signed)
Pt resting on cart in NAD. Alert, speaking in full sentences. Breathing easy, non-labored. VSS. Call light within reach. Hourly rounds completed. Denies any needs at this time

## 2019-12-30 ENCOUNTER — Encounter: Payer: Self-pay | Admitting: Family Medicine

## 2019-12-30 MED ORDER — GABAPENTIN 300 MG PO CAPS: 600.0000 mg | ORAL_CAPSULE | Freq: Three times a day (TID) | ORAL | 3 refills | Status: DC

## 2020-04-11 ENCOUNTER — Other Ambulatory Visit: Payer: Self-pay | Admitting: Orthopedic Surgery

## 2020-04-11 DIAGNOSIS — M546 Pain in thoracic spine: Secondary | ICD-10-CM

## 2020-05-08 ENCOUNTER — Encounter: Payer: Self-pay | Admitting: Family Medicine

## 2020-05-16 ENCOUNTER — Ambulatory Visit
Admission: RE | Admit: 2020-05-16 | Discharge: 2020-05-16 | Disposition: A | Discharge status: Home / Self Care | Payer: 59 | Source: Ambulatory Visit | Attending: Orthopedic Surgery | Admitting: Orthopedic Surgery

## 2020-05-16 ENCOUNTER — Other Ambulatory Visit: Payer: Self-pay

## 2020-05-16 DIAGNOSIS — M546 Pain in thoracic spine: Secondary | ICD-10-CM

## 2020-06-28 ENCOUNTER — Encounter: Payer: Self-pay | Admitting: Family Medicine

## 2020-07-03 ENCOUNTER — Encounter: Payer: Self-pay | Admitting: Family Medicine

## 2020-10-11 ENCOUNTER — Other Ambulatory Visit: Payer: Self-pay | Admitting: *Deleted

## 2020-10-11 ENCOUNTER — Encounter: Payer: Self-pay | Admitting: Family Medicine

## 2020-10-11 MED ORDER — GABAPENTIN 300 MG PO CAPS: 600.0000 mg | ORAL_CAPSULE | Freq: Three times a day (TID) | ORAL | 1 refills | Status: DC

## 2020-10-11 NOTE — Telephone Encounter (Signed)
Hey do you want to see prior to prescribing you have availability tomorrow at 0830 ok for VV if he is able?

## 2020-12-11 ENCOUNTER — Encounter: Payer: Self-pay | Admitting: Surgery

## 2020-12-11 ENCOUNTER — Ambulatory Visit: Payer: Self-pay | Admitting: Surgery

## 2020-12-16 ENCOUNTER — Emergency Department (HOSPITAL_COMMUNITY): Payer: 59

## 2020-12-16 ENCOUNTER — Encounter (HOSPITAL_COMMUNITY): Payer: Self-pay

## 2020-12-16 ENCOUNTER — Inpatient Hospital Stay (HOSPITAL_COMMUNITY)
Admission: EM | Admit: 2020-12-16 | Discharge: 2020-12-20 | DRG: 872 | Disposition: A | Discharge status: Home / Self Care | Payer: 59 | Attending: Internal Medicine | Admitting: Internal Medicine

## 2020-12-16 ENCOUNTER — Other Ambulatory Visit: Payer: Self-pay

## 2020-12-16 DIAGNOSIS — Z85828 Personal history of other malignant neoplasm of skin: Secondary | ICD-10-CM

## 2020-12-16 DIAGNOSIS — Z91030 Bee allergy status: Secondary | ICD-10-CM | POA: Diagnosis not present

## 2020-12-16 DIAGNOSIS — Z8619 Personal history of other infectious and parasitic diseases: Secondary | ICD-10-CM

## 2020-12-16 DIAGNOSIS — N10 Acute tubulo-interstitial nephritis: Secondary | ICD-10-CM | POA: Diagnosis present

## 2020-12-16 DIAGNOSIS — E1165 Type 2 diabetes mellitus with hyperglycemia: Secondary | ICD-10-CM | POA: Diagnosis present

## 2020-12-16 DIAGNOSIS — Z8249 Family history of ischemic heart disease and other diseases of the circulatory system: Secondary | ICD-10-CM

## 2020-12-16 DIAGNOSIS — F419 Anxiety disorder, unspecified: Secondary | ICD-10-CM | POA: Diagnosis present

## 2020-12-16 DIAGNOSIS — E1169 Type 2 diabetes mellitus with other specified complication: Secondary | ICD-10-CM | POA: Diagnosis present

## 2020-12-16 DIAGNOSIS — E1142 Type 2 diabetes mellitus with diabetic polyneuropathy: Secondary | ICD-10-CM | POA: Diagnosis present

## 2020-12-16 DIAGNOSIS — E7800 Pure hypercholesterolemia, unspecified: Secondary | ICD-10-CM | POA: Diagnosis present

## 2020-12-16 DIAGNOSIS — A419 Sepsis, unspecified organism: Secondary | ICD-10-CM | POA: Diagnosis present

## 2020-12-16 DIAGNOSIS — Z20822 Contact with and (suspected) exposure to covid-19: Secondary | ICD-10-CM | POA: Diagnosis present

## 2020-12-16 DIAGNOSIS — A4151 Sepsis due to Escherichia coli [E. coli]: Secondary | ICD-10-CM | POA: Diagnosis not present

## 2020-12-16 DIAGNOSIS — N390 Urinary tract infection, site not specified: Secondary | ICD-10-CM | POA: Diagnosis not present

## 2020-12-16 DIAGNOSIS — R059 Cough, unspecified: Secondary | ICD-10-CM

## 2020-12-16 DIAGNOSIS — R700 Elevated erythrocyte sedimentation rate: Secondary | ICD-10-CM | POA: Diagnosis present

## 2020-12-16 DIAGNOSIS — I10 Essential (primary) hypertension: Secondary | ICD-10-CM | POA: Diagnosis present

## 2020-12-16 DIAGNOSIS — Z6839 Body mass index (BMI) 39.0-39.9, adult: Secondary | ICD-10-CM

## 2020-12-16 DIAGNOSIS — J209 Acute bronchitis, unspecified: Secondary | ICD-10-CM | POA: Diagnosis present

## 2020-12-16 DIAGNOSIS — Z823 Family history of stroke: Secondary | ICD-10-CM | POA: Diagnosis not present

## 2020-12-16 DIAGNOSIS — N179 Acute kidney failure, unspecified: Secondary | ICD-10-CM | POA: Diagnosis present

## 2020-12-16 DIAGNOSIS — E785 Hyperlipidemia, unspecified: Secondary | ICD-10-CM | POA: Diagnosis present

## 2020-12-16 DIAGNOSIS — Z800 Family history of malignant neoplasm of digestive organs: Secondary | ICD-10-CM

## 2020-12-16 DIAGNOSIS — I444 Left anterior fascicular block: Secondary | ICD-10-CM | POA: Diagnosis present

## 2020-12-16 DIAGNOSIS — Z79899 Other long term (current) drug therapy: Secondary | ICD-10-CM

## 2020-12-16 DIAGNOSIS — Z7984 Long term (current) use of oral hypoglycemic drugs: Secondary | ICD-10-CM

## 2020-12-16 HISTORY — DX: Personal history of other infectious and parasitic diseases: Z86.19

## 2020-12-16 LAB — COMPREHENSIVE METABOLIC PANEL
ALT: 16 U/L (ref 0–44)
AST: 17 U/L (ref 15–41)
Albumin: 3.4 g/dL — ABNORMAL LOW (ref 3.5–5.0)
Alkaline Phosphatase: 75 U/L (ref 38–126)
Anion gap: 11 (ref 5–15)
BUN: 18 mg/dL (ref 6–20)
CO2: 23 mmol/L (ref 22–32)
Calcium: 9 mg/dL (ref 8.9–10.3)
Chloride: 101 mmol/L (ref 98–111)
Creatinine, Ser: 1.32 mg/dL — ABNORMAL HIGH (ref 0.61–1.24)
GFR, Estimated: >60 mL/min (ref >60)
Glucose, Bld: 163 mg/dL — ABNORMAL HIGH (ref 70–99)
Potassium: 3.6 mmol/L (ref 3.5–5.1)
Sodium: 135 mmol/L (ref 135–145)
Total Bilirubin: 3.1 mg/dL — ABNORMAL HIGH (ref 0.3–1.2)
Total Protein: 6.9 g/dL (ref 6.5–8.1)

## 2020-12-16 LAB — URINALYSIS, ROUTINE W REFLEX MICROSCOPIC
Bilirubin Urine: NEGATIVE
Glucose, UA: NEGATIVE mg/dL
Ketones, ur: 20 mg/dL — AB
Nitrite: POSITIVE — AB
Protein, ur: 100 mg/dL — AB
RBC / HPF: >50 RBC/hpf — ABNORMAL HIGH (ref 0–5)
Specific Gravity, Urine: 1.032 — ABNORMAL HIGH (ref 1.005–1.030)
WBC, UA: >50 WBC/hpf — ABNORMAL HIGH (ref 0–5)
pH: 5 (ref 5.0–8.0)

## 2020-12-16 LAB — LACTIC ACID, PLASMA
Lactic Acid, Venous: 1.6 mmol/L (ref 0.5–1.9)
Lactic Acid, Venous: 1.5 mmol/L (ref 0.5–1.9)

## 2020-12-16 LAB — PROTIME-INR
INR: 1.3 — ABNORMAL HIGH (ref 0.8–1.2)
Prothrombin Time: 15.9 seconds — ABNORMAL HIGH (ref 11.4–15.2)

## 2020-12-16 LAB — CBC
HCT: 42.6 % (ref 39.0–52.0)
Hemoglobin: 14.1 g/dL (ref 13.0–17.0)
MCH: 28.2 pg (ref 26.0–34.0)
MCHC: 33.1 g/dL (ref 30.0–36.0)
MCV: 85.2 fL (ref 80.0–100.0)
Platelets: 178 10*3/uL (ref 150–400)
RBC: 5 MIL/uL (ref 4.22–5.81)
RDW: 13.1 % (ref 11.5–15.5)
WBC: 16.9 10*3/uL — ABNORMAL HIGH (ref 4.0–10.5)
nRBC: 0 % (ref 0.0–0.2)

## 2020-12-16 LAB — RESP PANEL BY RT-PCR (FLU A&B, COVID) ARPGX2
Influenza A by PCR: NEGATIVE
Influenza B by PCR: NEGATIVE
SARS Coronavirus 2 by RT PCR: NEGATIVE

## 2020-12-16 LAB — GLUCOSE, CAPILLARY: Glucose-Capillary: 139 mg/dL — ABNORMAL HIGH (ref 70–99)

## 2020-12-16 LAB — LIPASE, BLOOD: Lipase: 26 U/L (ref 11–51)

## 2020-12-16 LAB — APTT: aPTT: 32 seconds (ref 24–36)

## 2020-12-16 MED ORDER — VITAMIN D 25 MCG (1000 UNIT) PO TABS
2000.0000 [IU] | ORAL_TABLET | Freq: Every day | ORAL | Status: DC
  Administered 2020-12-16 – 2020-12-19 (×4): 2000 [IU] via ORAL
  Filled 2020-12-16 (×2): qty 2
  Filled 2020-12-16: qty 5
  Filled 2020-12-16 (×2): qty 2

## 2020-12-16 MED ORDER — LACTATED RINGERS IV SOLN: INTRAVENOUS | Status: DC

## 2020-12-16 MED ORDER — SODIUM CHLORIDE 0.9 % IV SOLN
1.0000 g | INTRAVENOUS | Status: DC
  Administered 2020-12-17 – 2020-12-18 (×2): 1 g via INTRAVENOUS
  Filled 2020-12-16: qty 10
  Filled 2020-12-16: qty 1
  Filled 2020-12-16: qty 10

## 2020-12-16 MED ORDER — ADULT MULTIVITAMIN W/MINERALS CH
1.0000 | ORAL_TABLET | Freq: Every day | ORAL | Status: DC
  Administered 2020-12-16 – 2020-12-19 (×4): 1 via ORAL
  Filled 2020-12-16 (×4): qty 1

## 2020-12-16 MED ORDER — LACTATED RINGERS IV BOLUS (SEPSIS)
1000.0000 mL | Freq: Once | INTRAVENOUS | Status: AC
  Administered 2020-12-16: 1000 mL via INTRAVENOUS

## 2020-12-16 MED ORDER — ONDANSETRON HCL 4 MG PO TABS: 4.0000 mg | ORAL_TABLET | Freq: Four times a day (QID) | ORAL | Status: DC | PRN

## 2020-12-16 MED ORDER — ROSUVASTATIN CALCIUM 5 MG PO TABS
10.0000 mg | ORAL_TABLET | Freq: Every day | ORAL | Status: DC
  Administered 2020-12-16 – 2020-12-19 (×4): 10 mg via ORAL
  Filled 2020-12-16 (×4): qty 2

## 2020-12-16 MED ORDER — BENAZEPRIL HCL 40 MG PO TABS
40.0000 mg | ORAL_TABLET | Freq: Every day | ORAL | Status: DC
  Administered 2020-12-17 – 2020-12-19 (×4): 40 mg via ORAL
  Filled 2020-12-16 (×5): qty 1

## 2020-12-16 MED ORDER — IOHEXOL 300 MG/ML  SOLN
100.0000 mL | Freq: Once | INTRAMUSCULAR | Status: AC | PRN
  Administered 2020-12-16: 100 mL via INTRAVENOUS

## 2020-12-16 MED ORDER — ACETAMINOPHEN 325 MG PO TABS
650.0000 mg | ORAL_TABLET | Freq: Four times a day (QID) | ORAL | Status: DC | PRN
  Administered 2020-12-16 – 2020-12-18 (×2): 650 mg via ORAL
  Filled 2020-12-16 (×2): qty 2

## 2020-12-16 MED ORDER — GABAPENTIN 300 MG PO CAPS
600.0000 mg | ORAL_CAPSULE | Freq: Three times a day (TID) | ORAL | Status: DC
  Administered 2020-12-16 – 2020-12-20 (×11): 600 mg via ORAL
  Filled 2020-12-16 (×11): qty 2

## 2020-12-16 MED ORDER — INSULIN ASPART 100 UNIT/ML ~~LOC~~ SOLN
0.0000 [IU] | Freq: Three times a day (TID) | SUBCUTANEOUS | Status: DC
  Administered 2020-12-17 – 2020-12-19 (×6): 2 [IU] via SUBCUTANEOUS
  Administered 2020-12-19: 3 [IU] via SUBCUTANEOUS
  Administered 2020-12-20: 2 [IU] via SUBCUTANEOUS

## 2020-12-16 MED ORDER — AMLODIPINE BESYLATE 10 MG PO TABS
10.0000 mg | ORAL_TABLET | Freq: Every day | ORAL | Status: DC
  Administered 2020-12-17 – 2020-12-19 (×4): 10 mg via ORAL
  Filled 2020-12-16 (×4): qty 1

## 2020-12-16 MED ORDER — LACTATED RINGERS IV SOLN: INTRAVENOUS | Status: AC

## 2020-12-16 MED ORDER — SODIUM CHLORIDE 0.9 % IV SOLN
2.0000 g | Freq: Once | INTRAVENOUS | Status: AC
  Administered 2020-12-16: 2 g via INTRAVENOUS
  Filled 2020-12-16: qty 20

## 2020-12-16 MED ORDER — METOPROLOL SUCCINATE ER 100 MG PO TB24
100.0000 mg | ORAL_TABLET | Freq: Every day | ORAL | Status: DC
  Administered 2020-12-16 – 2020-12-19 (×4): 100 mg via ORAL
  Filled 2020-12-16 (×4): qty 1

## 2020-12-16 MED ORDER — INSULIN ASPART 100 UNIT/ML ~~LOC~~ SOLN: 0.0000 [IU] | Freq: Every day | SUBCUTANEOUS | Status: DC

## 2020-12-16 MED ORDER — METRONIDAZOLE IN NACL 5-0.79 MG/ML-% IV SOLN
500.0000 mg | Freq: Once | INTRAVENOUS | Status: AC
  Administered 2020-12-16: 500 mg via INTRAVENOUS
  Filled 2020-12-16: qty 100

## 2020-12-16 MED ORDER — FENTANYL CITRATE (PF) 100 MCG/2ML IJ SOLN
50.0000 ug | Freq: Once | INTRAMUSCULAR | Status: AC
  Administered 2020-12-16: 50 ug via INTRAVENOUS
  Filled 2020-12-16: qty 2

## 2020-12-16 MED ORDER — ENOXAPARIN SODIUM 60 MG/0.6ML ~~LOC~~ SOLN
60.0000 mg | SUBCUTANEOUS | Status: DC
  Administered 2020-12-17 – 2020-12-20 (×4): 60 mg via SUBCUTANEOUS
  Filled 2020-12-16 (×4): qty 0.6

## 2020-12-16 MED ORDER — ACETAMINOPHEN 650 MG RE SUPP: 650.0000 mg | Freq: Four times a day (QID) | RECTAL | Status: DC | PRN

## 2020-12-16 MED ORDER — ONDANSETRON HCL 4 MG/2ML IJ SOLN
4.0000 mg | Freq: Four times a day (QID) | INTRAMUSCULAR | Status: DC | PRN
  Administered 2020-12-17: 4 mg via INTRAVENOUS
  Filled 2020-12-16 (×2): qty 2

## 2020-12-16 MED ORDER — AMLODIPINE BESY-BENAZEPRIL HCL 10-40 MG PO CAPS: 1.0000 | ORAL_CAPSULE | Freq: Every day | ORAL | Status: DC

## 2020-12-16 NOTE — ED Notes (Signed)
IV placed via [place] needed to be taken out d/t pt's c/o pain. Athena Masse, PA-C notified. He will try another IV on pt. At this time, only LR is running.

## 2020-12-16 NOTE — H&P (Signed)
History and Physical   Adam Ford OEU:[id number] DOB: Dec 24, 1960 DOA: 12/16/2020  Referring MD/NP/PA: Dr. Almyra Free  PCP: Donald Prose, MD   Outpatient Specialists: None  Patient coming from: Home  Chief Complaint: Fever and dysuria  HPI: Adam Ford is a 60 y.o. male with medical history significant of diabetes, hypertension, hyperlipidemia, morbid obesity, history of skin cancer and anxiety disorder the presents to the ER with abdominal pain fever and chills.  Pain is usually in the flanks.  On the right side.  Patient came in and was seen.  He was evaluated in the ER and found to meet sepsis criteria.  He also has evidence of urinary tract infection.  He has some nausea without vomiting.  He also has one episode of diarrhea today.  Patient has had subjective fever at home.  He took some Tylenol.  At this point he appears to have sepsis due to UTI.  Patient's temperature on leukocytosis consistent with sepsis..  ED Course: Temperature 101 blood pressure 105/68 pulse 81 respirate of 18 oxygen sat 95% room air.  His white count is 16.9 otherwise rest of the CBC within normal.  Chemistries within normal except for creatinine 1.32.  His glucose is 163 and INR 1.3.  Lactic acid 1.6.  CT abdomen pelvis showed no acute findings.  Patient being admitted with sepsis due to UTI possibly acute pyelonephritis.  Review of Systems: As per HPI otherwise 10 point review of systems negative.    Past Medical History:  Diagnosis Date  . Anxiety   . Blepharitis   . Bulging of thoracic intervertebral disc    T8  . Hx of non-insulin dependent diabetes mellitus   . Hypercholesteremia   . Hypertension   . Skin cancer     Past Surgical History:  Procedure Laterality Date  . APPENDECTOMY  1969   as a child  . GALLBLADDER SURGERY    . GASTRIC SLEEVE    . lasers yag iridotomy Bilateral   . LITHOTRIPSY    . MOHS SURGERY     Dr. Pablo Ledger  . NOSE SURGERY     Dr. Redmond Baseman; x2  . TONSILLECTOMY     as  a child   . VASECTOMY       reports that he has never smoked. He has never used smokeless tobacco. He reports that he does not drink alcohol and does not use drugs.  Allergies  Allergen Reactions  . Bee Venom Anaphylaxis    Family History  Problem Relation Age of Onset  . Hypertension Mother   . Hypertension Father   . Aneurysm Father   . Colon cancer Maternal Grandmother   . Stroke Paternal Grandfather   . Stroke Other   . High Cholesterol Other        on both sides  . Other Neg Hx      Prior to Admission medications   Medication Sig Start Date End Date Taking? Authorizing Provider  amLODipine-benazepril (LOTREL) 10-40 MG capsule Take 1 capsule by mouth at bedtime.  04/03/18   [provider]  Cholecalciferol (VITAMIN D3 PO) Take 1 tablet by mouth at bedtime.    [provider]  gabapentin (NEURONTIN) 300 MG capsule Take 2 capsules (600 mg total) by mouth 3 (three) times daily. 10/11/20   Lomax, Amy, NP  metFORMIN (GLUCOPHAGE-XR) 500 MG 24 hr tablet Take 500 mg by mouth at bedtime.  12/14/19   [provider]  metoprolol succinate (TOPROL-XL) 50 MG 24 hr tablet Take  100 mg by mouth at bedtime.  04/06/18   [provider]  Multiple Vitamin (MULTIVITAMIN WITH MINERALS) TABS tablet Take 1 tablet by mouth at bedtime.    [provider]  rosuvastatin (CRESTOR) 10 MG tablet Take 10 mg by mouth at bedtime.  04/06/18   [provider]    Physical Exam: Vitals:   12/16/20 1600 12/16/20 1615 12/16/20 1645 12/16/20 1800  BP: 135/68 133/64 125/65 120/66  Pulse: 81 79 80 76  Resp:   16 18  Temp:      TempSrc:      SpO2: 98% 98% 98% 95%      Constitutional: Obese, no distress Vitals:   12/16/20 1600 12/16/20 1615 12/16/20 1645 12/16/20 1800  BP: 135/68 133/64 125/65 120/66  Pulse: 81 79 80 76  Resp:   16 18  Temp:      TempSrc:      SpO2: 98% 98% 98% 95%   Eyes: PERRL, lids and conjunctivae normal ENMT: Mucous membranes  are dry. Posterior pharynx clear of any exudate or lesions.Normal dentition.  Neck: normal, supple, no masses, no thyromegaly Respiratory: clear to auscultation bilaterally, no wheezing, no crackles. Normal respiratory effort. No accessory muscle use.  Cardiovascular: Regular rate and rhythm, no murmurs / rubs / gallops. No extremity edema. 2+ pedal pulses. No carotid bruits.  Abdomen: no tenderness, no masses palpated. No hepatosplenomegaly. Bowel sounds positive.  Musculoskeletal: no clubbing / cyanosis. No joint deformity upper and lower extremities. Good ROM, no contractures. Normal muscle tone.  Skin: no rashes, lesions, ulcers. No induration Neurologic: CN 2-12 grossly intact. Sensation intact, DTR normal. Strength 5/5 in all 4.  Psychiatric: Normal judgment and insight. Alert and oriented x 3. Normal mood.     Labs on Admission: I have personally reviewed following labs and imaging studies  CBC: Recent Labs  Lab 12/16/20 1249  WBC 16.9*  HGB 14.1  HCT 42.6  MCV 85.2  PLT 161   Basic Metabolic Panel: Recent Labs  Lab 12/16/20 1249  NA 135  K 3.6  CL 101  CO2 23  GLUCOSE 163*  BUN 18  CREATININE 1.32*  CALCIUM 9.0   GFR: CrCl cannot be calculated (Unknown ideal weight.). Liver Function Tests: Recent Labs  Lab 12/16/20 1249  AST 17  ALT 16  ALKPHOS 75  BILITOT 3.1*  PROT 6.9  ALBUMIN 3.4*   Recent Labs  Lab 12/16/20 1249  LIPASE 26   No results for input(s): AMMONIA in the last 168 hours. Coagulation Profile: Recent Labs  Lab 12/16/20 1716  INR 1.3*   Cardiac Enzymes: No results for input(s): CKTOTAL, CKMB, CKMBINDEX, TROPONINI in the last 168 hours. BNP (last 3 results) No results for input(s): PROBNP in the last 8760 hours. HbA1C: No results for input(s): HGBA1C in the last 72 hours. CBG: No results for input(s): GLUCAP in the last 168 hours. Lipid Profile: No results for input(s): CHOL, HDL, LDLCALC, TRIG, CHOLHDL, LDLDIRECT in the last 72  hours. Thyroid Function Tests: No results for input(s): TSH, T4TOTAL, FREET4, T3FREE, THYROIDAB in the last 72 hours. Anemia Panel: No results for input(s): VITAMINB12, FOLATE, FERRITIN, TIBC, IRON, RETICCTPCT in the last 72 hours. Urine analysis:    Component Value Date/Time   COLORURINE AMBER (A) 12/16/2020 1328   APPEARANCEUR CLOUDY (A) 12/16/2020 1328   LABSPEC 1.032 (H) 12/16/2020 1328   PHURINE 5.0 12/16/2020 1328   GLUCOSEU NEGATIVE 12/16/2020 1328   HGBUR LARGE (A) 12/16/2020 [place] 12/16/2020  1328   KETONESUR 20 (A) 12/16/2020 1328   PROTEINUR 100 (A) 12/16/2020 1328   NITRITE POSITIVE (A) 12/16/2020 1328   LEUKOCYTESUR LARGE (A) 12/16/2020 1328   Sepsis Labs: @LABRCNTIP (procalcitonin:4,lacticidven:4) ) Recent Results (from the past 240 hour(s))  Resp Panel by RT-PCR (Flu A&B, Covid) Nasopharyngeal Swab     Status: None   Collection Time: 12/16/20  5:54 PM   Specimen: Nasopharyngeal Swab; Nasopharyngeal(NP) swabs in vial transport medium  Result Value Ref Range Status   SARS Coronavirus 2 by RT PCR NEGATIVE NEGATIVE Final    Comment: (NOTE) SARS-CoV-2 target nucleic acids are NOT DETECTED.  The SARS-CoV-2 RNA is generally detectable in upper respiratory specimens during the acute phase of infection. The lowest concentration of SARS-CoV-2 viral copies this assay can detect is 138 copies/mL. A negative result does not preclude SARS-Cov-2 infection and should not be used as the sole basis for treatment or other patient management decisions. A negative result may occur with  improper specimen collection/handling, submission of specimen other than nasopharyngeal swab, presence of viral mutation(s) within the areas targeted by this assay, and inadequate number of viral copies(<138 copies/mL). A negative result must be combined with clinical observations, patient history, and epidemiological information. The expected result is Negative.  Fact Sheet  for Patients:  EntrepreneurPulse.com.au  Fact Sheet for Healthcare Providers:  IncredibleEmployment.be  This test is no t yet approved or cleared by the [place] FDA and  has been authorized for detection and/or diagnosis of SARS-CoV-2 by FDA under an Emergency Use Authorization (EUA). This EUA will remain  in effect (meaning this test can be used) for the duration of the COVID-19 declaration under Section 564(b)(1) of the Act, 21 U.S.C.section 360bbb-3(b)(1), unless the authorization is terminated  or revoked sooner.       Influenza A by PCR NEGATIVE NEGATIVE Final   Influenza B by PCR NEGATIVE NEGATIVE Final    Comment: (NOTE) The Xpert Xpress SARS-CoV-2/FLU/RSV plus assay is intended as an aid in the diagnosis of influenza from Nasopharyngeal swab specimens and should not be used as a sole basis for treatment. Nasal washings and aspirates are unacceptable for Xpert Xpress SARS-CoV-2/FLU/RSV testing.  Fact Sheet for Patients: EntrepreneurPulse.com.au  Fact Sheet for Healthcare Providers: IncredibleEmployment.be  This test is not yet approved or cleared by the [place] FDA and has been authorized for detection and/or diagnosis of SARS-CoV-2 by FDA under an Emergency Use Authorization (EUA). This EUA will remain in effect (meaning this test can be used) for the duration of the COVID-19 declaration under Section 564(b)(1) of the Act, 21 U.S.C. section 360bbb-3(b)(1), unless the authorization is terminated or revoked.  Performed at [facility], [place] [address], [place], [place] [address]      Radiological Exams on Admission: CT ABDOMEN PELVIS W CONTRAST  Result Date: 12/16/2020 CLINICAL DATA:  RIGHT lower quadrant pain with chills.  Fever EXAM: CT ABDOMEN AND PELVIS WITH CONTRAST TECHNIQUE: Multidetector CT imaging of the abdomen and pelvis was performed using the standard protocol  following bolus administration of intravenous contrast. CONTRAST:  123mL OMNIPAQUE IOHEXOL 300 MG/ML  SOLN COMPARISON:  CT 07/11/2019 FINDINGS: Lower chest: Lung bases are clear. Hepatobiliary: No focal hepatic lesion. Postcholecystectomy. No biliary dilatation. Pancreas: Pancreas is normal. No ductal dilatation. No pancreatic inflammation. Spleen: Normal spleen Adrenals/urinary tract: Adrenal glands normal. Bilateral nonobstructing renal calculi. No ureterolithiasis or obstructive uropathy. No bladder calculi. Stomach/Bowel: Post gastric sleeve bariatric surgery. Duodenum and small-bowel normal. Ascending and transverse colon normal. No  bowel obstruction. Rectosigmoid colon normal. Vascular/Lymphatic: Abdominal aorta is normal caliber with atherosclerotic calcification. There is no retroperitoneal or periportal lymphadenopathy. No pelvic lymphadenopathy. Reproductive: Lesion prostate gland is enlarged measuring 6.8 by 5.7 by 5.1 cm (volume = 100 cm^3) Other: No free fluid. Musculoskeletal: No aggressive osseous lesion. IMPRESSION: 1. No acute findings in the abdomen pelvis. 2. Bilateral nonobstructing renal calculi. 3. No bowel obstruction or bowel inflammation. 4. Prostatomegaly. 5.  Aortic Atherosclerosis (ICD10-I70.0). Electronically Signed   By: Suzy Bouchard M.D.   On: 12/16/2020 19:55   DG Chest Port 1 View  Result Date: 12/16/2020 CLINICAL DATA:  Questionable sepsis; evaluate for abnormality EXAM: PORTABLE CHEST 1 VIEW COMPARISON:  None. FINDINGS: The heart size and mediastinal contours are within normal limits. Linear atelectasis versus scarring at the left base. No focal consolidation. No pulmonary edema. No pleural effusion. No pneumothorax. No acute osseous abnormality. IMPRESSION: No active disease. Electronically Signed   By: Iven Finn M.D.   On: 12/16/2020 16:57    EKG: Independently reviewed.  Sinus rhythm no significant changes  Assessment/Plan Principal Problem:   Sepsis  secondary to UTI [facility]) Active Problems:   Benign essential hypertension   Hyperlipidemia   Type 2 diabetes mellitus with other specified complication (HCC)   Acute pyelonephritis     #1 sepsis secondary to UTI: Suspected acute pyelonephritis.  Level patient will be admitted.  Initiate IV Rocephin.  Blood and urine cultures obtained.  Follow the sepsis protocol with fluid resuscitation.  Once cultures are back we will adjust antibiotics accordingly.  #2 acute pyelonephritis: Continue as per above.  #3 essential hypertension: Resume home regimen  #4 hyperlipidemia: Continue home regimen  #5 diabetes: Initiate sliding scale insulin monitor  #6 morbid obesity: Dietary counseling  #7 AKI: Patient probably has chronic kidney disease stage III from diabetes.  Hydrate and monitor renal function   DVT prophylaxis: Lovenox Code Status: Full code Family Communication: No family at bedside Disposition Plan: Home Consults called: None Admission status: Inpatient  Severity of Illness: The appropriate patient status for this patient is INPATIENT. Inpatient status is judged to be reasonable and necessary in order to provide the required intensity of service to ensure the patient's safety. The patient's presenting symptoms, physical exam findings, and initial radiographic and laboratory data in the context of their chronic comorbidities is felt to place them at high risk for further clinical deterioration. Furthermore, it is not anticipated that the patient will be medically stable for discharge from the hospital within 2 midnights of admission. The following factors support the patient status of inpatient.   " The patient's presenting symptoms include abdominal pain with nausea. " The worrisome physical exam findings include no significant findings on exam. " The initial radiographic and laboratory data are worrisome because of evidence of sepsis and UTI. " The chronic co-morbidities include  diabetes with hypertension.   * I certify that at the point of admission it is my clinical judgment that the patient will require inpatient hospital care spanning beyond 2 midnights from the point of admission due to high intensity of service, high risk for further deterioration and high frequency of surveillance required.Barbette Merino MD [facility] Pager [phone]  If 7PM-7AM, please contact night-coverage [email or web address] Password [facility]  12/16/2020, 9:45 PM

## 2020-12-16 NOTE — ED Notes (Addendum)
Per EMT pt now has a fever.  Spoke with pt and he states he took 3 Tylenol 500mg  30 min ago.

## 2020-12-16 NOTE — Progress Notes (Signed)
New Admission Note:  Arrival Method: Strecher Mental Orientation: Alert  And oriented x 4 Telemetry: Box 11 NSR Assessment: Completed Skin: Warm and dry.  IV: NSL Pain: 7/10  Abdomen RLQ Tubes: N/A Safety Measures: Safety Fall Prevention Plan initiated.  Admission: Completed 5 M  Orientation: Patient has been orientated to the room, unit and the staff. Welcome booklet given.  Family: Wife   Orders have been reviewed and implemented. Will continue to monitor the patient. Call light has been placed within reach and bed alarm has been activated.   [name] BSN, RN  Phone Number: [phone]

## 2020-12-16 NOTE — Progress Notes (Signed)
Report received. Room is ready.  

## 2020-12-16 NOTE — ED Provider Notes (Cosign Needed Addendum)
[facility] EMERGENCY DEPARTMENT Provider Note   CSN: [id number] Arrival date & time: 12/16/20  1214     History No chief complaint on file.   Adam Ford is a 60 y.o. male history includes obesity, anxiety, hypertension, hypercholesterolemia, diabetes, appendectomy, cholecystectomy.  Patient presents today for abdominal pain and chills.  Patient reports intermittent abdominal pain right-sided x1 month no clear inciting event or aggravating/alleviating factors.  Pain worsened over the last 2 days, he reports severe sharp right lower quadrant pain constant nonradiating no alleviating factors worse with palpation.  He reports feeling warm at home but had not measured a fever took some Tylenol prior to arrival.  Reports nausea without emesis.  Reports small amount of diarrhea today.  Patient also reports dysuria and hematuria started yesterday.  Denies headache, neck stiffness, chest pain, cough/hemoptysis, vomiting, testicular pain/swelling, melena, hematochezia or any additional concerns.  HPI     Past Medical History:  Diagnosis Date  . Anxiety   . Blepharitis   . Bulging of thoracic intervertebral disc    T8  . Hx of non-insulin dependent diabetes mellitus   . Hypercholesteremia   . Hypertension   . Skin cancer     Patient Active Problem List   Diagnosis Date Noted  . 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 Right sided temporal headache 04/27/2018    Past Surgical History:  Procedure Laterality Date  . APPENDECTOMY  1969   as a child  . GALLBLADDER SURGERY    . GASTRIC SLEEVE    . lasers yag iridotomy Bilateral   . LITHOTRIPSY    . MOHS SURGERY     Dr. Pablo Ledger  . NOSE SURGERY     Dr. Redmond Baseman; x2  . TONSILLECTOMY     as a child   . VASECTOMY         Family History  Problem Relation Age of Onset  . Hypertension Mother   . Hypertension Father   . Aneurysm Father   . Colon cancer Maternal Grandmother   . Stroke Paternal Grandfather   .  Stroke Other   . High Cholesterol Other        on both sides  . Other Neg Hx     Social History   Tobacco Use  . Smoking status: Never Smoker  . Smokeless tobacco: Never Used  Vaping Use  . Vaping Use: Never used  Substance Use Topics  . Alcohol use: Never  . Drug use: Never    Home Medications Prior to Admission medications   Medication Sig Start Date End Date Taking? Authorizing Provider  amLODipine-benazepril (LOTREL) 10-40 MG capsule Take 1 capsule by mouth at bedtime.  04/03/18   [provider]  Cholecalciferol (VITAMIN D3 PO) Take 1 tablet by mouth at bedtime.    [provider]  gabapentin (NEURONTIN) 300 MG capsule Take 2 capsules (600 mg total) by mouth 3 (three) times daily. 10/11/20   Lomax, Amy, NP  metFORMIN (GLUCOPHAGE-XR) 500 MG 24 hr tablet Take 500 mg by mouth at bedtime.  12/14/19   [provider]  metoprolol succinate (TOPROL-XL) 50 MG 24 hr tablet Take 100 mg by mouth at bedtime.  04/06/18   [provider]  Multiple Vitamin (MULTIVITAMIN WITH MINERALS) TABS tablet Take 1 tablet by mouth at bedtime.    [provider]  rosuvastatin (CRESTOR) 10 MG tablet Take 10 mg by mouth at bedtime.  04/06/18   [provider]    Allergies    Bee  venom  Review of Systems   Review of Systems Ten systems are reviewed and are negative for acute change except as noted in the HPI  Physical Exam Updated Vital Signs BP 120/66   Pulse 76   Temp (!) 101 F (38.3 C) (Oral)   Resp 18   SpO2 95%   Physical Exam Constitutional:      General: He is not in acute distress.    Appearance: Normal appearance. He is well-developed. He is not ill-appearing or diaphoretic.  HENT:     Head: Normocephalic and atraumatic.  Eyes:     General: Vision grossly intact. Gaze aligned appropriately.     Pupils: Pupils are equal, round, and reactive to light.  Neck:     Trachea: Trachea and phonation normal.  Cardiovascular:     Rate and  Rhythm: Normal rate and regular rhythm.  Pulmonary:     Effort: Pulmonary effort is normal. No respiratory distress.     Breath sounds: Normal breath sounds.  Abdominal:     General: There is no distension.     Palpations: Abdomen is soft.     Tenderness: There is abdominal tenderness in the right lower quadrant. There is guarding. There is no rebound.  Genitourinary:    Comments: Refused by patient Musculoskeletal:        General: Normal range of motion.     Cervical back: Normal range of motion.  Skin:    General: Skin is warm and dry.  Neurological:     Mental Status: He is alert.     GCS: GCS eye subscore is 4. GCS verbal subscore is 5. GCS motor subscore is 6.     Comments: Speech is clear and goal oriented, follows commands Major Cranial nerves without deficit, no facial droop Moves extremities without ataxia, coordination intact  Psychiatric:        Behavior: Behavior normal.     ED Results / Procedures / Treatments   Labs (all labs ordered are listed, but only abnormal results are displayed) Labs Reviewed  COMPREHENSIVE METABOLIC PANEL - Abnormal; Notable for the following components:      Result Value   Glucose, Bld 163 (*)    Creatinine, Ser 1.32 (*)    Albumin 3.4 (*)    Total Bilirubin 3.1 (*)    All other components within normal limits  CBC - Abnormal; Notable for the following components:   WBC 16.9 (*)    All other components within normal limits  URINALYSIS, ROUTINE W REFLEX MICROSCOPIC - Abnormal; Notable for the following components:   Color, Urine AMBER (*)    APPearance CLOUDY (*)    Specific Gravity, Urine 1.032 (*)    Hgb urine dipstick LARGE (*)    Ketones, ur 20 (*)    Protein, ur 100 (*)    Nitrite POSITIVE (*)    Leukocytes,Ua LARGE (*)    RBC / HPF >50 (*)    WBC, UA >50 (*)    Bacteria, UA MANY (*)    All other components within normal limits  PROTIME-INR - Abnormal; Notable for the following components:   Prothrombin Time 15.9 (*)     INR 1.3 (*)    All other components within normal limits  RESP PANEL BY RT-PCR (FLU A&B, COVID) ARPGX2  CULTURE, BLOOD (ROUTINE X 2)  CULTURE, BLOOD (ROUTINE X 2)  URINE CULTURE  LIPASE, BLOOD  LACTIC ACID, PLASMA  APTT  LACTIC ACID, PLASMA    EKG None  Radiology  CT ABDOMEN PELVIS W CONTRAST  Result Date: 12/16/2020 CLINICAL DATA:  RIGHT lower quadrant pain with chills.  Fever EXAM: CT ABDOMEN AND PELVIS WITH CONTRAST TECHNIQUE: Multidetector CT imaging of the abdomen and pelvis was performed using the standard protocol following bolus administration of intravenous contrast. CONTRAST:  178mL OMNIPAQUE IOHEXOL 300 MG/ML  SOLN COMPARISON:  CT 07/11/2019 FINDINGS: Lower chest: Lung bases are clear. Hepatobiliary: No focal hepatic lesion. Postcholecystectomy. No biliary dilatation. Pancreas: Pancreas is normal. No ductal dilatation. No pancreatic inflammation. Spleen: Normal spleen Adrenals/urinary tract: Adrenal glands normal. Bilateral nonobstructing renal calculi. No ureterolithiasis or obstructive uropathy. No bladder calculi. Stomach/Bowel: Post gastric sleeve bariatric surgery. Duodenum and small-bowel normal. Ascending and transverse colon normal. No bowel obstruction. Rectosigmoid colon normal. Vascular/Lymphatic: Abdominal aorta is normal caliber with atherosclerotic calcification. There is no retroperitoneal or periportal lymphadenopathy. No pelvic lymphadenopathy. Reproductive: Lesion prostate gland is enlarged measuring 6.8 by 5.7 by 5.1 cm (volume = 100 cm^3) Other: No free fluid. Musculoskeletal: No aggressive osseous lesion. IMPRESSION: 1. No acute findings in the abdomen pelvis. 2. Bilateral nonobstructing renal calculi. 3. No bowel obstruction or bowel inflammation. 4. Prostatomegaly. 5.  Aortic Atherosclerosis (ICD10-I70.0). Electronically Signed   By: Suzy Bouchard M.D.   On: 12/16/2020 19:55   DG Chest Port 1 View  Result Date: 12/16/2020 CLINICAL DATA:  Questionable sepsis;  evaluate for abnormality EXAM: PORTABLE CHEST 1 VIEW COMPARISON:  None. FINDINGS: The heart size and mediastinal contours are within normal limits. Linear atelectasis versus scarring at the left base. No focal consolidation. No pulmonary edema. No pleural effusion. No pneumothorax. No acute osseous abnormality. IMPRESSION: No active disease. Electronically Signed   By: Iven Finn M.D.   On: 12/16/2020 16:57    Procedures Ultrasound ED Peripheral IV (Provider)  Date/Time: 12/16/2020 7:08 PM Performed by: Deliah Boston, PA-C Authorized by: Deliah Boston, PA-C   Procedure details:    Indications: multiple failed IV attempts     Skin Prep: chlorhexidine gluconate     Location:  Left AC   Angiocath:  20 G   Bedside Ultrasound Guided: Yes     Patient tolerated procedure without complications: Yes     Dressing applied: Yes    .Critical Care Performed by: Deliah Boston, PA-C Authorized by: Deliah Boston, PA-C   Critical care provider statement:    Critical care time (minutes):  40   Critical care was necessary to treat or prevent imminent or life-threatening deterioration of the following conditions:  Sepsis   Critical care was time spent personally by me on the following activities:  Discussions with consultants, evaluation of patient's response to treatment, examination of patient, ordering and performing treatments and interventions, ordering and review of laboratory studies, ordering and review of radiographic studies, pulse oximetry, re-evaluation of patient's condition, obtaining history from patient or surrogate, review of old charts and development of treatment plan with patient or surrogate     Medications Ordered in ED Medications  lactated ringers infusion ( Intravenous New Bag/Given 12/16/20 1840)  metroNIDAZOLE (FLAGYL) IVPB 500 mg (has no administration in time range)  lactated ringers bolus 1,000 mL (0 mLs Intravenous Stopped 12/16/20 2052)  cefTRIAXone  (ROCEPHIN) 2 g in sodium chloride 0.9 % 100 mL IVPB (0 g Intravenous Paused 12/16/20 1855)  fentaNYL (SUBLIMAZE) injection 50 mcg (50 mcg Intravenous Given 12/16/20 1833)  iohexol (OMNIPAQUE) 300 MG/ML solution 100 mL (100 mLs Intravenous Contrast Given 12/16/20 1939)    ED Course  I have reviewed the  triage vital signs and the nursing notes.  Pertinent labs & imaging results that were available during my care of the patient were reviewed by me and considered in my medical decision making (see chart for details).    MDM Rules/Calculators/A&P                         Additional history obtained from: 1. Nursing notes from this visit. 2. Electronic medical records. 3. Family, wife at bedside. ---------- 60 year old male presented for 1 month of abdominal pain worsening over the last day as well as dysuria and hematuria starting yesterday.  Reports fever and chills at home took Tylenol Jayel Scaduto prior to arrival.  In waiting room patient noted to be febrile.  Basic labs were obtained in triage including CBC, CMP, lipase and urinalysis.  CBC shows leukocytosis of 16.9, no anemia.  CMP showed no emergent electrolyte derangement or gap.  T bili 3.1, mild elevation of creatinine at 1.32.  Urinalysis shows evidence of UTI.  On my initial evaluation code sepsis activated as patient with fever, leukocytosis and source of infection.  IV fluids ordered in addition to Flagyl/Rocephin for suspected intra-abdominal source.  Will obtain blood cultures, urine culture, lactic and CT abdomen pelvis as well as chest x-ray.  1 L lactated Ringer's ordered along with 150 mL/h infusion.  We will avoid the full 30 cc/kg bolus as patient morbidly obese and with stable blood pressure and no tachycardia. - IV team was consulted due to patient being a hard stick.  They were able to place an IV in the patient's right upper arm however this need to be removed by nursing staff.  I then placed a 20-gauge IV in the patient's left [facility] for CT  of the abdomen and pelvis.  On reevaluation patient remains stable vital signs within normal limits. - Lactic acid within normal limits is reassuring. Covid/influenza panel negative.  CXR:  IMPRESSION:  No active disease.   CTAP:  IMPRESSION:  1. No acute findings in the abdomen pelvis.  2. Bilateral nonobstructing renal calculi.  3. No bowel obstruction or bowel inflammation.  4. Prostatomegaly.  5. Aortic Atherosclerosis (ICD10-I70.0).  ===== Patient reassessed he is resting in bed no acute distress vital signs are stable.  He has received empiric intra-abdominal antibiotics Rocephin and Flagyl.  Rocephin should cover urinary tract infection.  Possible right side pain from developing questionable Pilo.  He has received 1 L lactated Ringer bolus as well as lactated Ringer infusion no indication at this time for additional fluids as lactic is reassuring and blood pressure is stable.  Discussed plan of care with Dr. Almyra Free, will admit to medicine service at this time.  Patient and his wife were updated on findings and they state understanding. - 8:53 PM: Consult with Dr. Jeannie Fend, patient was accepted to hospitalist service.  Note: Portions of this report may have been transcribed using voice recognition software. Every effort was made to ensure accuracy; however, inadvertent computerized transcription errors may still be present. Final Clinical Impression(s) / ED Diagnoses Final diagnoses:  Sepsis without acute organ dysfunction, due to unspecified organism [facility])  Urinary tract infection with hematuria, site unspecified    Rx / DC Orders ED Discharge Orders    None       Gari Crown 12/16/20 2121    Gari Crown 12/16/20 2142    Luna Fuse, MD 12/18/20 2228

## 2020-12-16 NOTE — Plan of Care (Signed)
  Problem: Education: Goal: Knowledge of General Education information will improve Description: Including pain rating scale, medication(s)/side effects and non-pharmacologic comfort measures Outcome: Completed/Met

## 2020-12-16 NOTE — ED Notes (Signed)
Pt very hard stick. IV team consulted. Athena Masse, PA-C notified.

## 2020-12-16 NOTE — ED Notes (Signed)
Patient transported to CT 

## 2020-12-16 NOTE — ED Triage Notes (Signed)
Patient complains of 1 month of abdominal pain with chills. Initially thought it was a hernia and evaluated by surgeon who stated no hernia. Nausea with no emesis and diarrhea. Alert and oriented

## 2020-12-17 ENCOUNTER — Other Ambulatory Visit: Payer: Self-pay

## 2020-12-17 LAB — COMPREHENSIVE METABOLIC PANEL
ALT: 14 U/L (ref 0–44)
AST: 17 U/L (ref 15–41)
Albumin: 3.1 g/dL — ABNORMAL LOW (ref 3.5–5.0)
Alkaline Phosphatase: 69 U/L (ref 38–126)
Anion gap: 10 (ref 5–15)
BUN: 13 mg/dL (ref 6–20)
CO2: 24 mmol/L (ref 22–32)
Calcium: 8.9 mg/dL (ref 8.9–10.3)
Chloride: 103 mmol/L (ref 98–111)
Creatinine, Ser: 1.03 mg/dL (ref 0.61–1.24)
GFR, Estimated: >60 mL/min (ref >60)
Glucose, Bld: 162 mg/dL — ABNORMAL HIGH (ref 70–99)
Potassium: 3.6 mmol/L (ref 3.5–5.1)
Sodium: 137 mmol/L (ref 135–145)
Total Bilirubin: 1.9 mg/dL — ABNORMAL HIGH (ref 0.3–1.2)
Total Protein: 6.6 g/dL (ref 6.5–8.1)

## 2020-12-17 LAB — GLUCOSE, CAPILLARY
Glucose-Capillary: 152 mg/dL — ABNORMAL HIGH (ref 70–99)
Glucose-Capillary: 140 mg/dL — ABNORMAL HIGH (ref 70–99)
Glucose-Capillary: 126 mg/dL — ABNORMAL HIGH (ref 70–99)
Glucose-Capillary: 136 mg/dL — ABNORMAL HIGH (ref 70–99)

## 2020-12-17 LAB — CBC
HCT: 39.6 % (ref 39.0–52.0)
Hemoglobin: 13.8 g/dL (ref 13.0–17.0)
MCH: 29.4 pg (ref 26.0–34.0)
MCHC: 34.8 g/dL (ref 30.0–36.0)
MCV: 84.3 fL (ref 80.0–100.0)
Platelets: 171 10*3/uL (ref 150–400)
RBC: 4.7 MIL/uL (ref 4.22–5.81)
RDW: 13.1 % (ref 11.5–15.5)
WBC: 14.6 10*3/uL — ABNORMAL HIGH (ref 4.0–10.5)
nRBC: 0 % (ref 0.0–0.2)

## 2020-12-17 LAB — HIV ANTIBODY (ROUTINE TESTING W REFLEX): HIV Screen 4th Generation wRfx: NONREACTIVE

## 2020-12-17 LAB — CORTISOL-AM, BLOOD: Cortisol - AM: 17 ug/dL (ref 6.7–22.6)

## 2020-12-17 LAB — SEDIMENTATION RATE: Sed Rate: 48 mm/hr — ABNORMAL HIGH (ref 0–16)

## 2020-12-17 LAB — C-REACTIVE PROTEIN: CRP: 29.5 mg/dL — ABNORMAL HIGH (ref <1.0)

## 2020-12-17 LAB — LACTIC ACID, PLASMA: Lactic Acid, Venous: 1.8 mmol/L (ref 0.5–1.9)

## 2020-12-17 LAB — HEMOGLOBIN A1C
Hgb A1c MFr Bld: 6.7 % — ABNORMAL HIGH (ref 4.8–5.6)
Mean Plasma Glucose: 145.59 mg/dL

## 2020-12-17 LAB — PROCALCITONIN: Procalcitonin: 0.56 ng/mL

## 2020-12-17 LAB — PROTIME-INR
INR: 1.3 — ABNORMAL HIGH (ref 0.8–1.2)
Prothrombin Time: 15.4 seconds — ABNORMAL HIGH (ref 11.4–15.2)

## 2020-12-17 MED ORDER — DICYCLOMINE HCL 10 MG PO CAPS
10.0000 mg | ORAL_CAPSULE | Freq: Three times a day (TID) | ORAL | Status: DC
  Administered 2020-12-17 – 2020-12-20 (×13): 10 mg via ORAL
  Filled 2020-12-17 (×13): qty 1

## 2020-12-17 NOTE — Progress Notes (Signed)
Pt requested  RT to decrease CPAP settings. RT decreased to 8CPAP. Pt tolerating well

## 2020-12-17 NOTE — Progress Notes (Signed)
PROGRESS NOTE  Adam Ford BSW:[id number] DOB: 09/02/1961 DOA: 12/16/2020 PCP: Donald Prose, MD  HPI/Recap of past 24 hours: Adam Ford is a 60 y.o. male with medical history significant of diabetes, hypertension, hyperlipidemia, morbid obesity, history of skin cancer and anxiety disorder the presents to the ER with abdominal pain fever and chills.  Pain is usually in the flanks.  On the right side.  Patient came in and was seen.  He was evaluated in the ER and found to meet sepsis criteria.  He also has evidence of urinary tract infection.  He has some nausea without vomiting.  He also has one episode of diarrhea today.  Patient has had subjective fever at home.  He took some Tylenol.  At this point he appears to have sepsis due to UTI.  Patient's temperature on leukocytosis consistent with sepsis..  ED Course: Temperature 101 blood pressure 105/68 pulse 81 respirate of 18 oxygen sat 95% room air.  His white count is 16.9 otherwise rest of the CBC within normal.  Chemistries within normal except for creatinine 1.32.  His glucose is 163 and INR 1.3.  Lactic acid 1.6.  CT abdomen pelvis showed no acute findings.  Patient being admitted with sepsis due to UTI possibly acute pyelonephritis.  12/17/20: Patient was seen and examined at his bedside.  His wife was present.  He reports persistent right lower quadrant abdominal pain.  Also reports persistent diarrhea.  GI panel and C. difficile PCR ordered.  He had a CT abdomen pelvis which was done and revealed bilateral nonobstructive kidney stones.  His urine analysis was positive for pyuria.  Urine culture is pending.  Currently on Rocephin empirically.  Assessment/Plan: Principal Problem:   Sepsis secondary to UTI [facility]) Active Problems:   Benign essential hypertension   Hyperlipidemia   Type 2 diabetes mellitus with other specified complication (HCC)   Acute pyelonephritis  Sepsis secondary to presumed UTI, POA Presented with leukocytosis  and tachypnea, respiratory rate 28, evidence of pyuria on UA. Obtain urine culture, pending. Blood cultures in process x2. Currently on Rocephin empirically Follow cultures for ID and sensitivities No evidence of pyelonephritis on CT abdomen and pelvis with contrast done on 12/16/2020.  Intractable right lower quadrant abdominal pain/acute diarrhea, suspect infective. The patient and his wife reports right lower abdominal pain has been present for over 1 month No acute diarrhea, persistent Obtain GI panel and C. difficile PCR, follow results. Added Bentyl for spasm  Elevated sed rate and CRP, unclear etiology CRP 29.5, sed rate 48 on 12/17/2020. Repeat level in the morning  Diabetes polyneuropathy Continue home gabapentin  Essential hypertension BP is currently at goal. Continue home Toprol-XL 100 mg daily, benazepril 40 mg nightly, Norvasc 10 mg nightly. Continue to monitor vital signs.  Type 2 diabetes with hyperglycemia Obtain A1c Continue insulin sliding scale.  Hyperlipidemia Continue home Crestor 10 mg daily.  Obesity BMI 39 Recommend weight loss outpatient with regular physical activity and healthy dieting.   Code Status: Full code.  Family Communication: Updated his wife at bedside.  Disposition Plan: Likely discharge to home on 12/19/2020 1 symptomatology has improved.   Consultants:  None.  Procedures:  None.  Antimicrobials:  Rocephin.  DVT prophylaxis: Subcutaneous daily.  Status is: Inpatient    Dispo:  Patient From: Home  Planned Disposition: Home  \Anticipated discharge date 12/19/2020 or when diarrhea has improved.  Medically stable for discharge: No, ongoing management of sepsis secondary to presumed UTI and intractable diarrhea.  Objective: Vitals:   12/16/20 2333 12/17/20 0024 12/17/20 0421 12/17/20 0923  BP: (!) 147/70  128/67 121/69  Pulse: 81 78 72 70  Resp: 20 16 20 18   Temp: 98.7 F (37.1 C)  98.4 F (36.9 C)  98.9 F (37.2 C)  TempSrc:   Oral   SpO2: 99% 97% 98% 96%  Weight:      Height:        Intake/Output Summary (Last 24 hours) at 12/17/2020 1525 Last data filed at 12/17/2020 1230 Gross per 24 hour  Intake 4158.28 ml  Output 800 ml  Net 3358.28 ml   Filed Weights   12/16/20 2300  Weight: 124.7 kg    Exam:   General: 60 y.o. year-old male well developed well nourished in no acute distress.  Alert and oriented x3.  Cardiovascular: Regular rate and rhythm with no rubs or gallops.  No thyromegaly or JVD noted.    Respiratory: Clear to auscultation with no wheezes or rales. Good inspiratory effort.  Abdomen: Soft obese.  Notable tenderness with minimal palpation of the right lower quadrant.  Bowel sounds present.    Musculoskeletal: No lower extremity edema bilaterally.  Skin: No ulcerative lesions noted or rashes,  Psychiatry: Mood is appropriate for condition and setting   Data Reviewed: CBC: Recent Labs  Lab 12/16/20 1249 12/17/20 0401  WBC 16.9* 14.6*  HGB 14.1 13.8  HCT 42.6 39.6  MCV 85.2 84.3  PLT 178 366   Basic Metabolic Panel: Recent Labs  Lab 12/16/20 1249 12/17/20 0401  NA 135 137  K 3.6 3.6  CL 101 103  CO2 23 24  GLUCOSE 163* 162*  BUN 18 13  CREATININE 1.32* 1.03  CALCIUM 9.0 8.9   GFR: Estimated Creatinine Clearance: 104.9 mL/min (by C-G formula based on SCr of 1.03 mg/dL). Liver Function Tests: Recent Labs  Lab 12/16/20 1249 12/17/20 0401  AST 17 17  ALT 16 14  ALKPHOS 75 69  BILITOT 3.1* 1.9*  PROT 6.9 6.6  ALBUMIN 3.4* 3.1*   Recent Labs  Lab 12/16/20 1249  LIPASE 26   No results for input(s): AMMONIA in the last 168 hours. Coagulation Profile: Recent Labs  Lab 12/16/20 1716 12/17/20 0401  INR 1.3* 1.3*   Cardiac Enzymes: No results for input(s): CKTOTAL, CKMB, CKMBINDEX, TROPONINI in the last 168 hours. BNP (last 3 results) No results for input(s): PROBNP in the last 8760 hours. HbA1C: Recent Labs     12/17/20 0401  HGBA1C 6.7*   CBG: Recent Labs  Lab 12/16/20 2343 12/17/20 0817 12/17/20 1201  GLUCAP 139* 152* 140*   Lipid Profile: No results for input(s): CHOL, HDL, LDLCALC, TRIG, CHOLHDL, LDLDIRECT in the last 72 hours. Thyroid Function Tests: No results for input(s): TSH, T4TOTAL, FREET4, T3FREE, THYROIDAB in the last 72 hours. Anemia Panel: No results for input(s): VITAMINB12, FOLATE, FERRITIN, TIBC, IRON, RETICCTPCT in the last 72 hours. Urine analysis:    Component Value Date/Time   COLORURINE AMBER (A) 12/16/2020 1328   APPEARANCEUR CLOUDY (A) 12/16/2020 1328   LABSPEC 1.032 (H) 12/16/2020 1328   PHURINE 5.0 12/16/2020 1328   GLUCOSEU NEGATIVE 12/16/2020 1328   HGBUR LARGE (A) 12/16/2020 1328   BILIRUBINUR NEGATIVE 12/16/2020 1328   KETONESUR 20 (A) 12/16/2020 1328   PROTEINUR 100 (A) 12/16/2020 1328   NITRITE POSITIVE (A) 12/16/2020 1328   LEUKOCYTESUR LARGE (A) 12/16/2020 1328   Sepsis Labs: @LABRCNTIP (procalcitonin:4,lacticidven:4)  ) Recent Results (from the past 240 hour(s))  Blood Culture (routine x 2)  Status: None (Preliminary result)   Collection Time: 12/16/20  5:16 PM   Specimen: BLOOD  Result Value Ref Range Status   Specimen Description BLOOD RIGHT ANTECUBITAL  Final   Special Requests   Final    BOTTLES DRAWN AEROBIC AND ANAEROBIC Blood Culture adequate volume   Culture   Final    NO GROWTH < 24 HOURS Performed at [facility], [address], [place], [place] [address]    Report Status PENDING  Incomplete  Resp Panel by RT-PCR (Flu A&B, Covid) Nasopharyngeal Swab     Status: None   Collection Time: 12/16/20  5:54 PM   Specimen: Nasopharyngeal Swab; Nasopharyngeal(NP) swabs in vial transport medium  Result Value Ref Range Status   SARS Coronavirus 2 by RT PCR NEGATIVE NEGATIVE Final    Comment: (NOTE) SARS-CoV-2 target nucleic acids are NOT DETECTED.  The SARS-CoV-2 RNA is generally detectable in upper respiratory specimens  during the acute phase of infection. The lowest concentration of SARS-CoV-2 viral copies this assay can detect is 138 copies/mL. A negative result does not preclude SARS-Cov-2 infection and should not be used as the sole basis for treatment or other patient management decisions. A negative result may occur with  improper specimen collection/handling, submission of specimen other than nasopharyngeal swab, presence of viral mutation(s) within the areas targeted by this assay, and inadequate number of viral copies(<138 copies/mL). A negative result must be combined with clinical observations, patient history, and epidemiological information. The expected result is Negative.  Fact Sheet for Patients:  EntrepreneurPulse.com.au  Fact Sheet for Healthcare Providers:  IncredibleEmployment.be  This test is no t yet approved or cleared by the [place] FDA and  has been authorized for detection and/or diagnosis of SARS-CoV-2 by FDA under an Emergency Use Authorization (EUA). This EUA will remain  in effect (meaning this test can be used) for the duration of the COVID-19 declaration under Section 564(b)(1) of the Act, 21 U.S.C.section 360bbb-3(b)(1), unless the authorization is terminated  or revoked sooner.       Influenza A by PCR NEGATIVE NEGATIVE Final   Influenza B by PCR NEGATIVE NEGATIVE Final    Comment: (NOTE) The Xpert Xpress SARS-CoV-2/FLU/RSV plus assay is intended as an aid in the diagnosis of influenza from Nasopharyngeal swab specimens and should not be used as a sole basis for treatment. Nasal washings and aspirates are unacceptable for Xpert Xpress SARS-CoV-2/FLU/RSV testing.  Fact Sheet for Patients: EntrepreneurPulse.com.au  Fact Sheet for Healthcare Providers: IncredibleEmployment.be  This test is not yet approved or cleared by the [place] FDA and has been authorized for detection  and/or diagnosis of SARS-CoV-2 by FDA under an Emergency Use Authorization (EUA). This EUA will remain in effect (meaning this test can be used) for the duration of the COVID-19 declaration under Section 564(b)(1) of the Act, 21 U.S.C. section 360bbb-3(b)(1), unless the authorization is terminated or revoked.  Performed at [facility], [place] [address] [place], [place] [address]   Blood Culture (routine x 2)     Status: None (Preliminary result)   Collection Time: 12/16/20  9:20 PM   Specimen: BLOOD LEFT HAND  Result Value Ref Range Status   Specimen Description BLOOD LEFT HAND  Final   Special Requests   Final    BOTTLES DRAWN AEROBIC AND ANAEROBIC Blood Culture adequate volume   Culture   Final    NO GROWTH < 12 HOURS Performed at [facility], [place] [address] [place], [place]  [address]    Report Status PENDING  Incomplete      Studies: CT ABDOMEN PELVIS W CONTRAST  Result Date: 12/16/2020 CLINICAL DATA:  RIGHT lower quadrant pain with chills.  Fever EXAM: CT ABDOMEN AND PELVIS WITH CONTRAST TECHNIQUE: Multidetector CT imaging of the abdomen and pelvis was performed using the standard protocol following bolus administration of intravenous contrast. CONTRAST:  127mL OMNIPAQUE IOHEXOL 300 MG/ML  SOLN COMPARISON:  CT 07/11/2019 FINDINGS: Lower chest: Lung bases are clear. Hepatobiliary: No focal hepatic lesion. Postcholecystectomy. No biliary dilatation. Pancreas: Pancreas is normal. No ductal dilatation. No pancreatic inflammation. Spleen: Normal spleen Adrenals/urinary tract: Adrenal glands normal. Bilateral nonobstructing renal calculi. No ureterolithiasis or obstructive uropathy. No bladder calculi. Stomach/Bowel: Post gastric sleeve bariatric surgery. Duodenum and small-bowel normal. Ascending and transverse colon normal. No bowel obstruction. Rectosigmoid colon normal. Vascular/Lymphatic: Abdominal aorta is normal caliber with atherosclerotic calcification. There is no  retroperitoneal or periportal lymphadenopathy. No pelvic lymphadenopathy. Reproductive: Lesion prostate gland is enlarged measuring 6.8 by 5.7 by 5.1 cm (volume = 100 cm^3) Other: No free fluid. Musculoskeletal: No aggressive osseous lesion. IMPRESSION: 1. No acute findings in the abdomen pelvis. 2. Bilateral nonobstructing renal calculi. 3. No bowel obstruction or bowel inflammation. 4. Prostatomegaly. 5.  Aortic Atherosclerosis (ICD10-I70.0). Electronically Signed   By: Suzy Bouchard M.D.   On: 12/16/2020 19:55   DG Chest Port 1 View  Result Date: 12/16/2020 CLINICAL DATA:  Questionable sepsis; evaluate for abnormality EXAM: PORTABLE CHEST 1 VIEW COMPARISON:  None. FINDINGS: The heart size and mediastinal contours are within normal limits. Linear atelectasis versus scarring at the left base. No focal consolidation. No pulmonary edema. No pleural effusion. No pneumothorax. No acute osseous abnormality. IMPRESSION: No active disease. Electronically Signed   By: Iven Finn M.D.   On: 12/16/2020 16:57    Scheduled Meds:  amLODipine  10 mg Oral QHS   And   benazepril  40 mg Oral QHS   cholecalciferol  2,000 Units Oral QHS   dicyclomine  10 mg Oral TID AC & HS   enoxaparin (LOVENOX) injection  60 mg Subcutaneous Q24H   gabapentin  600 mg Oral TID   insulin aspart  0-15 Units Subcutaneous TID WC   insulin aspart  0-5 Units Subcutaneous QHS   metoprolol succinate  100 mg Oral QHS   multivitamin with minerals  1 tablet Oral QHS   rosuvastatin  10 mg Oral QHS    Continuous Infusions:  cefTRIAXone (ROCEPHIN)  IV     lactated ringers 125 mL/hr at 12/17/20 0908     LOS: 1 day     Kayleen Memos, MD [facility] Pager [phone]  If 7PM-7AM, please contact night-coverage [email or web address] Password [facility] 12/17/2020, 3:25 PM

## 2020-12-18 LAB — CBC WITH DIFFERENTIAL/PLATELET
Abs Immature Granulocytes: 0.05 10*3/uL (ref 0.00–0.07)
Basophils Absolute: 0 10*3/uL (ref 0.0–0.1)
Basophils Relative: 0 %
Eosinophils Absolute: 0.2 10*3/uL (ref 0.0–0.5)
Eosinophils Relative: 2 %
HCT: 38.8 % — ABNORMAL LOW (ref 39.0–52.0)
Hemoglobin: 13 g/dL (ref 13.0–17.0)
Immature Granulocytes: 1 %
Lymphocytes Relative: 6 %
Lymphs Abs: 0.5 10*3/uL — ABNORMAL LOW (ref 0.7–4.0)
MCH: 28.4 pg (ref 26.0–34.0)
MCHC: 33.5 g/dL (ref 30.0–36.0)
MCV: 84.9 fL (ref 80.0–100.0)
Monocytes Absolute: 1.1 10*3/uL — ABNORMAL HIGH (ref 0.1–1.0)
Monocytes Relative: 15 %
Neutro Abs: 5.8 10*3/uL (ref 1.7–7.7)
Neutrophils Relative %: 76 %
Platelets: 173 10*3/uL (ref 150–400)
RBC: 4.57 MIL/uL (ref 4.22–5.81)
RDW: 12.9 % (ref 11.5–15.5)
WBC: 7.7 10*3/uL (ref 4.0–10.5)
nRBC: 0 % (ref 0.0–0.2)

## 2020-12-18 LAB — COMPREHENSIVE METABOLIC PANEL
ALT: 15 U/L (ref 0–44)
AST: 17 U/L (ref 15–41)
Albumin: 2.7 g/dL — ABNORMAL LOW (ref 3.5–5.0)
Alkaline Phosphatase: 66 U/L (ref 38–126)
Anion gap: 11 (ref 5–15)
BUN: 9 mg/dL (ref 6–20)
CO2: 22 mmol/L (ref 22–32)
Calcium: 8.5 mg/dL — ABNORMAL LOW (ref 8.9–10.3)
Chloride: 103 mmol/L (ref 98–111)
Creatinine, Ser: 1.02 mg/dL (ref 0.61–1.24)
GFR, Estimated: >60 mL/min (ref >60)
Glucose, Bld: 140 mg/dL — ABNORMAL HIGH (ref 70–99)
Potassium: 3.5 mmol/L (ref 3.5–5.1)
Sodium: 136 mmol/L (ref 135–145)
Total Bilirubin: 1.1 mg/dL (ref 0.3–1.2)
Total Protein: 6.1 g/dL — ABNORMAL LOW (ref 6.5–8.1)

## 2020-12-18 LAB — C DIFFICILE QUICK SCREEN W PCR REFLEX
C Diff antigen: NEGATIVE
C Diff interpretation: NOT DETECTED
C Diff toxin: NEGATIVE

## 2020-12-18 LAB — MAGNESIUM: Magnesium: 1.9 mg/dL (ref 1.7–2.4)

## 2020-12-18 LAB — GLUCOSE, CAPILLARY
Glucose-Capillary: 134 mg/dL — ABNORMAL HIGH (ref 70–99)
Glucose-Capillary: 110 mg/dL — ABNORMAL HIGH (ref 70–99)
Glucose-Capillary: 130 mg/dL — ABNORMAL HIGH (ref 70–99)
Glucose-Capillary: 154 mg/dL — ABNORMAL HIGH (ref 70–99)

## 2020-12-18 MED ORDER — TAMSULOSIN HCL 0.4 MG PO CAPS
0.4000 mg | ORAL_CAPSULE | Freq: Every day | ORAL | Status: DC
  Administered 2020-12-18 – 2020-12-19 (×2): 0.4 mg via ORAL
  Filled 2020-12-18 (×2): qty 1

## 2020-12-18 MED ORDER — SACCHAROMYCES BOULARDII 250 MG PO CAPS
250.0000 mg | ORAL_CAPSULE | Freq: Two times a day (BID) | ORAL | Status: DC
  Administered 2020-12-18 – 2020-12-20 (×5): 250 mg via ORAL
  Filled 2020-12-18 (×5): qty 1

## 2020-12-18 MED ORDER — GUAIFENESIN-DM 100-10 MG/5ML PO SYRP
5.0000 mL | ORAL_SOLUTION | ORAL | Status: DC | PRN
  Administered 2020-12-18 – 2020-12-19 (×2): 5 mL via ORAL
  Filled 2020-12-18 (×2): qty 5

## 2020-12-18 MED ORDER — DM-GUAIFENESIN ER 30-600 MG PO TB12: 2.0000 | ORAL_TABLET | Freq: Two times a day (BID) | ORAL | Status: DC

## 2020-12-18 NOTE — Progress Notes (Signed)
Patient refused CPAP.

## 2020-12-18 NOTE — Evaluation (Signed)
Physical Therapy Evaluation Patient Details Name: Adam Ford MRN: [id number] DOB: [birth year] Today's Date: 12/18/2020   History of Present Illness  Adam Gullatt is a 60 y.o. male with medical history significant of diabetes, hypertension, hyperlipidemia, morbid obesity, history of skin cancer and anxiety disorder the presents to the ER with abdominal pain fever and chills. Sepsis secondary to presumed UTI, also with abdominal pain and diarrhea, C.diff pending  Clinical Impression   Patient evaluated by Physical Therapy with no further acute PT needs identified. All education has been completed and the patient has no further questions. Independent with all mobility;  See below for any follow-up Physical Therapy or equipment needs. PT is signing off. Thank you for this referral.     Follow Up Recommendations No PT follow up    Equipment Recommendations  None recommended by PT    Recommendations for Other Services       Precautions / Restrictions Precautions Precautions: Other (comment) Precaution Comments: Enteric Precautions; Educated pt in how to observe precautions when he walks the hallways      Mobility  Bed Mobility Overal bed mobility: Independent                  Transfers Overall transfer level: Independent Equipment used: None                Ambulation/Gait Ambulation/Gait assistance: Independent Gait Distance (Feet): 1000 Feet (Greater tahn) Assistive device: None Gait Pattern/deviations: WFL(Within Functional Limits)     General Gait Details: No difficulty  Stairs Stairs:  (Don't anticipate difficulty with stairs)          Wheelchair Mobility    Modified Rankin (Stroke Patients Only)       Balance Overall balance assessment: No apparent balance deficits (not formally assessed)                                           Pertinent Vitals/Pain Pain Assessment: Faces Faces Pain Scale: Hurts little more Pain  Location: Backside Pain Descriptors / Indicators: Grimacing Pain Intervention(s): Monitored during session;Other (comment) (Noted grimace with BM/wiping; offered barrier cream and provided with more washcloths)    Home Living Family/patient expects to be discharged to:: Private residence Living Arrangements: Spouse/significant other Available Help at Discharge: Family Type of Home: House Home Access: Stairs to enter     Home Layout: Two level        Prior Function Level of Independence: Independent               Hand Dominance        Extremity/Trunk Assessment   Upper Extremity Assessment Upper Extremity Assessment: Overall WFL for tasks assessed    Lower Extremity Assessment Lower Extremity Assessment: Overall WFL for tasks assessed       Communication   Communication: No difficulties  Cognition Arousal/Alertness: Awake/alert Behavior During Therapy: WFL for tasks assessed/performed Overall Cognitive Status: Within Functional Limits for tasks assessed                                        General Comments      Exercises     Assessment/Plan    PT Assessment Patent does not need any further PT services  PT Problem List         PT  Treatment Interventions      PT Goals (Current goals can be found in the Care Plan section)  Acute Rehab PT Goals Patient Stated Goal: Get better PT Goal Formulation: All assessment and education complete, DC therapy    Frequency     Barriers to discharge        Co-evaluation               AM-PAC PT "6 Clicks" Mobility  Outcome Measure Help needed turning from your back to your side while in a flat bed without using bedrails?: None Help needed moving from lying on your back to sitting on the side of a flat bed without using bedrails?: None Help needed moving to and from a bed to a chair (including a wheelchair)?: None Help needed standing up from a chair using your arms (e.g., wheelchair  or bedside chair)?: None Help needed to walk in hospital room?: None Help needed climbing 3-5 steps with a railing? : None 6 Click Score: 24    End of Session   Activity Tolerance: Patient tolerated treatment well Patient left: in bed;with call bell/phone within reach;with nursing/sitter in room Nurse Communication: Mobility status PT Visit Diagnosis: Other abnormalities of gait and mobility (R26.89)    Time: 1010-1046 PT Time Calculation (min) (ACUTE ONLY): 36 min   Charges:   PT Evaluation $PT Eval Low Complexity: 1 Low PT Treatments $Gait Training: 8-22 mins        Roney Marion, PT  Acute Rehabilitation Services Pager [phone] Office [place] 12/18/2020, 11:02 AM

## 2020-12-18 NOTE — Plan of Care (Signed)
  Problem: Activity: Goal: Risk for activity intolerance will decrease Outcome: Progressing   Problem: Elimination: Goal: Will not experience complications related to urinary retention Outcome: Progressing   

## 2020-12-18 NOTE — Progress Notes (Signed)
PROGRESS NOTE  Adam Hunt FOY:[id number] DOB: 12/26/1960 DOA: 12/16/2020 PCP: Donald Prose, MD  HPI/Recap of past 24 hours: Adam Ford is a 60 y.o. male with medical history significant of diabetes, hypertension, hyperlipidemia, morbid obesity, history of skin cancer and anxiety disorder who presents from home to the ER with abdominal pain fever and chills.  Pain is usually in the flanks.    Worse on the right side.  He was evaluated in the ER and found to meet sepsis criteria with evidence of urinary tract infection.    Associated with nausea without vomiting.  Also reported diarrhea of a few days duration.  Cultures were obtained, urine and blood cultures.  He was started on broad-spectrum IV antibiotics.  He reported abdominal pain which was thought secondary to abdominal spasms, improved with Bentyl.  Stool analysis was negative for C. difficile PCR.  GI panel is in process.  12/18/20: Patient was seen and examined at his bedside.  His wife was not present.  He reports his abdominal pain is improved on Bentyl.  Assessment/Plan: Principal Problem:   Sepsis secondary to UTI [facility]) Active Problems:   Benign essential hypertension   Hyperlipidemia   Type 2 diabetes mellitus with other specified complication (HCC)   Acute pyelonephritis  Sepsis, sepsis criteria is resolving, secondary to presumed UTI, POA Presented with leukocytosis and tachypnea, respiratory rate 28, evidence of pyuria on UA. Urine culture is in process. Blood cultures negative to date. Continue Rocephin empirically Follow cultures for ID and sensitivities No evidence of pyelonephritis on CT abdomen and pelvis with contrast done on 12/16/2020.  Improving intractable right lower quadrant abdominal pain/acute diarrhea, suspect infective. The patient and his wife reports right lower abdominal pain has been present for over 1 month Acute diarrhea. C. difficile PCR negative GI panel in process. Continue Bentyl for  spasm  Possible urinary retention States he has been having polyuria and dribbling with urination Obtain bladder scan post voiding Starting Flomax  Elevated sed rate and CRP, likely reactive in the setting of acute illness. CRP 29.5, sed rate 48 on 12/17/2020. Repeat levels in the morning.  Diabetes polyneuropathy Continue home gabapentin  Essential hypertension Blood pressure is currently at goal. Continue home Toprol-XL 100 mg daily, benazepril 40 mg nightly, Norvasc 10 mg nightly. Continue to monitor vital signs.  Type 2 diabetes with hyperglycemia Hemoglobin A1c 6.7 on 12/17/2020. Continue insulin sliding scale.  Hyperlipidemia Continue home Crestor 10 mg daily.  Obesity BMI 39 Recommend weight loss outpatient with regular physical activity and healthy dieting.   Code Status: Full code.  Family Communication: Updated his wife at bedside.  Disposition Plan: Likely discharge to home on 12/19/2020 once symptomatology has improved.   Consultants:  None.  Procedures:  None.  Antimicrobials:  Rocephin.  DVT prophylaxis: Subcutaneous daily.  Status is: Inpatient    Dispo:  Patient From: Home  Planned Disposition: Home  \Anticipated discharge date 12/19/2020 or when diarrhea has improved.  Medically stable for discharge: No, ongoing management of sepsis secondary to presumed UTI and intractable diarrhea.          Objective: Vitals:   12/17/20 1906 12/17/20 2106 12/18/20 0611 12/18/20 0922  BP: 122/66 135/77 130/70 (!) 113/51  Pulse: 71 72 72 62  Resp: 18 16 18 18   Temp: 98.6 F (37 C) 99.1 F (37.3 C) 98.7 F (37.1 C) 98.2 F (36.8 C)  TempSrc:  Oral Oral   SpO2: 92% 95% 93% 93%  Weight:  Height:        Intake/Output Summary (Last 24 hours) at 12/18/2020 1555 Last data filed at 12/18/2020 1330 Gross per 24 hour  Intake 660 ml  Output 700 ml  Net -40 ml   Filed Weights   12/16/20 2300  Weight: 124.7 kg    Exam:  . General: 61 y.o.  year-old male well-developed well-nourished no acute distress.  He is alert and oriented x3.   . Cardiovascular: Regular rate and rhythm no rubs or gallops.   Marland Kitchen Respiratory: Clear to auscultation no wheezes or rales.   . Abdomen: Soft obese.  Bowel sounds present.   . Musculoskeletal: No lower extremity edema bilaterally.   . Skin: No ulcerative lesions noted.   Marland Kitchen Psychiatry: Mood is appropriate for condition and setting.  Data Reviewed: CBC: Recent Labs  Lab 12/16/20 1249 12/17/20 0401 12/18/20 0741  WBC 16.9* 14.6* 7.7  NEUTROABS  --   --  5.8  HGB 14.1 13.8 13.0  HCT 42.6 39.6 38.8*  MCV 85.2 84.3 84.9  PLT 178 171 938   Basic Metabolic Panel: Recent Labs  Lab 12/16/20 1249 12/17/20 0401 12/18/20 0741  NA 135 137 136  K 3.6 3.6 3.5  CL 101 103 103  CO2 23 24 22   GLUCOSE 163* 162* 140*  BUN 18 13 9   CREATININE 1.32* 1.03 1.02  CALCIUM 9.0 8.9 8.5*  MG  --   --  1.9   GFR: Estimated Creatinine Clearance: 105.9 mL/min (by C-G formula based on SCr of 1.02 mg/dL). Liver Function Tests: Recent Labs  Lab 12/16/20 1249 12/17/20 0401 12/18/20 0741  AST 17 17 17   ALT 16 14 15   ALKPHOS 75 69 66  BILITOT 3.1* 1.9* 1.1  PROT 6.9 6.6 6.1*  ALBUMIN 3.4* 3.1* 2.7*   Recent Labs  Lab 12/16/20 1249  LIPASE 26   No results for input(s): AMMONIA in the last 168 hours. Coagulation Profile: Recent Labs  Lab 12/16/20 1716 12/17/20 0401  INR 1.3* 1.3*   Cardiac Enzymes: No results for input(s): CKTOTAL, CKMB, CKMBINDEX, TROPONINI in the last 168 hours. BNP (last 3 results) No results for input(s): PROBNP in the last 8760 hours. HbA1C: Recent Labs    12/17/20 0401  HGBA1C 6.7*   CBG: Recent Labs  Lab 12/17/20 1201 12/17/20 1608 12/17/20 2031 12/18/20 0739 12/18/20 1158  GLUCAP 140* 126* 136* 134* 110*   Lipid Profile: No results for input(s): CHOL, HDL, LDLCALC, TRIG, CHOLHDL, LDLDIRECT in the last 72 hours. Thyroid Function Tests: No results for  input(s): TSH, T4TOTAL, FREET4, T3FREE, THYROIDAB in the last 72 hours. Anemia Panel: No results for input(s): VITAMINB12, FOLATE, FERRITIN, TIBC, IRON, RETICCTPCT in the last 72 hours. Urine analysis:    Component Value Date/Time   COLORURINE AMBER (A) 12/16/2020 1328   APPEARANCEUR CLOUDY (A) 12/16/2020 1328   LABSPEC 1.032 (H) 12/16/2020 1328   PHURINE 5.0 12/16/2020 1328   GLUCOSEU NEGATIVE 12/16/2020 1328   HGBUR LARGE (A) 12/16/2020 1328   BILIRUBINUR NEGATIVE 12/16/2020 1328   KETONESUR 20 (A) 12/16/2020 1328   PROTEINUR 100 (A) 12/16/2020 1328   NITRITE POSITIVE (A) 12/16/2020 1328   LEUKOCYTESUR LARGE (A) 12/16/2020 1328   Sepsis Labs: @LABRCNTIP (procalcitonin:4,lacticidven:4)  ) Recent Results (from the past 240 hour(s))  Urine culture     Status: Abnormal (Preliminary result)   Collection Time: 12/16/20  4:33 PM   Specimen: In/Out Cath Urine  Result Value Ref Range Status   Specimen Description IN/OUT CATH URINE  Final  Special Requests NONE  Final   Culture (A)  Final    >=100,000 COLONIES/mL ESCHERICHIA COLI SUSCEPTIBILITIES TO FOLLOW Performed at [facility], [place] [address], [place], [place] [address]    Report Status PENDING  Incomplete  Blood Culture (routine x 2)     Status: None (Preliminary result)   Collection Time: 12/16/20  5:16 PM   Specimen: BLOOD  Result Value Ref Range Status   Specimen Description BLOOD RIGHT ANTECUBITAL  Final   Special Requests   Final    BOTTLES DRAWN AEROBIC AND ANAEROBIC Blood Culture adequate volume   Culture   Final    NO GROWTH 2 DAYS Performed at [facility], [place] [address] [place], [place] [address]    Report Status PENDING  Incomplete  Resp Panel by RT-PCR (Flu A&B, Covid) Nasopharyngeal Swab     Status: None   Collection Time: 12/16/20  5:54 PM   Specimen: Nasopharyngeal Swab; Nasopharyngeal(NP) swabs in vial transport medium  Result Value Ref Range Status   SARS Coronavirus 2 by RT PCR NEGATIVE  NEGATIVE Final    Comment: (NOTE) SARS-CoV-2 target nucleic acids are NOT DETECTED.  The SARS-CoV-2 RNA is generally detectable in upper respiratory specimens during the acute phase of infection. The lowest concentration of SARS-CoV-2 viral copies this assay can detect is 138 copies/mL. A negative result does not preclude SARS-Cov-2 infection and should not be used as the sole basis for treatment or other patient management decisions. A negative result may occur with  improper specimen collection/handling, submission of specimen other than nasopharyngeal swab, presence of viral mutation(s) within the areas targeted by this assay, and inadequate number of viral copies(<138 copies/mL). A negative result must be combined with clinical observations, patient history, and epidemiological information. The expected result is Negative.  Fact Sheet for Patients:  EntrepreneurPulse.com.au  Fact Sheet for Healthcare Providers:  IncredibleEmployment.be  This test is no t yet approved or cleared by the [place] FDA and  has been authorized for detection and/or diagnosis of SARS-CoV-2 by FDA under an Emergency Use Authorization (EUA). This EUA will remain  in effect (meaning this test can be used) for the duration of the COVID-19 declaration under Section 564(b)(1) of the Act, 21 U.S.C.section 360bbb-3(b)(1), unless the authorization is terminated  or revoked sooner.       Influenza A by PCR NEGATIVE NEGATIVE Final   Influenza B by PCR NEGATIVE NEGATIVE Final    Comment: (NOTE) The Xpert Xpress SARS-CoV-2/FLU/RSV plus assay is intended as an aid in the diagnosis of influenza from Nasopharyngeal swab specimens and should not be used as a sole basis for treatment. Nasal washings and aspirates are unacceptable for Xpert Xpress SARS-CoV-2/FLU/RSV testing.  Fact Sheet for Patients: EntrepreneurPulse.com.au  Fact Sheet for Healthcare  Providers: IncredibleEmployment.be  This test is not yet approved or cleared by the [place] FDA and has been authorized for detection and/or diagnosis of SARS-CoV-2 by FDA under an Emergency Use Authorization (EUA). This EUA will remain in effect (meaning this test can be used) for the duration of the COVID-19 declaration under Section 564(b)(1) of the Act, 21 U.S.C. section 360bbb-3(b)(1), unless the authorization is terminated or revoked.  Performed at [facility], [place] [address], [place], [place] [address]   Blood Culture (routine x 2)     Status: None (Preliminary result)   Collection Time: 12/16/20  9:20 PM   Specimen: BLOOD LEFT HAND  Result Value Ref Range Status   Specimen  Description BLOOD LEFT HAND  Final   Special Requests   Final    BOTTLES DRAWN AEROBIC AND ANAEROBIC Blood Culture adequate volume   Culture   Final    NO GROWTH 2 DAYS Performed at [facility], [address], [place], [place] [address]    Report Status PENDING  Incomplete  C Difficile Quick Screen w PCR reflex     Status: None   Collection Time: 12/18/20  6:25 AM   Specimen: STOOL  Result Value Ref Range Status   C Diff antigen NEGATIVE NEGATIVE Final   C Diff toxin NEGATIVE NEGATIVE Final   C Diff interpretation No C. difficile detected.  Final    Comment: Performed at [facility], [place] [address], [place], [place] [address]      Studies: No results found.  Scheduled Meds: . amLODipine  10 mg Oral QHS   And  . benazepril  40 mg Oral QHS  . cholecalciferol  2,000 Units Oral QHS  . dicyclomine  10 mg Oral TID AC & HS  . enoxaparin (LOVENOX) injection  60 mg Subcutaneous Q24H  . gabapentin  600 mg Oral TID  . insulin aspart  0-15 Units Subcutaneous TID WC  . insulin aspart  0-5 Units Subcutaneous QHS  . metoprolol succinate  100 mg Oral QHS  . multivitamin with minerals  1 tablet Oral QHS  . rosuvastatin  10 mg Oral QHS  . saccharomyces  boulardii  250 mg Oral BID  . tamsulosin  0.4 mg Oral Daily    Continuous Infusions: . cefTRIAXone (ROCEPHIN)  IV 1 g (12/17/20 1750)  . lactated ringers 125 mL/hr at 12/17/20 1749     LOS: 2 days     Kayleen Memos, MD [facility] Pager [phone]  If 7PM-7AM, please contact night-coverage [email or web address] Password [facility] 12/18/2020, 3:55 PM

## 2020-12-19 ENCOUNTER — Inpatient Hospital Stay (HOSPITAL_COMMUNITY): Payer: 59

## 2020-12-19 LAB — GASTROINTESTINAL PANEL BY PCR, STOOL (REPLACES STOOL CULTURE)

## 2020-12-19 LAB — URINE CULTURE
Culture: >=100000 — AB
Culture: NO GROWTH

## 2020-12-19 LAB — GLUCOSE, CAPILLARY
Glucose-Capillary: 130 mg/dL — ABNORMAL HIGH (ref 70–99)
Glucose-Capillary: 126 mg/dL — ABNORMAL HIGH (ref 70–99)
Glucose-Capillary: 116 mg/dL — ABNORMAL HIGH (ref 70–99)
Glucose-Capillary: 151 mg/dL — ABNORMAL HIGH (ref 70–99)

## 2020-12-19 LAB — SEDIMENTATION RATE: Sed Rate: 54 mm/hr — ABNORMAL HIGH (ref 0–16)

## 2020-12-19 LAB — PROCALCITONIN: Procalcitonin: 0.29 ng/mL

## 2020-12-19 LAB — C-REACTIVE PROTEIN: CRP: 12.2 mg/dL — ABNORMAL HIGH (ref <1.0)

## 2020-12-19 MED ORDER — ALBUTEROL SULFATE (2.5 MG/3ML) 0.083% IN NEBU
2.5000 mg | INHALATION_SOLUTION | Freq: Three times a day (TID) | RESPIRATORY_TRACT | Status: DC
  Administered 2020-12-19: 2.5 mg via RESPIRATORY_TRACT
  Filled 2020-12-19: qty 3

## 2020-12-19 MED ORDER — DOXYCYCLINE HYCLATE 100 MG PO TABS
100.0000 mg | ORAL_TABLET | Freq: Two times a day (BID) | ORAL | Status: DC
  Administered 2020-12-19 – 2020-12-20 (×3): 100 mg via ORAL
  Filled 2020-12-19 (×3): qty 1

## 2020-12-19 MED ORDER — PHENAZOPYRIDINE HCL 100 MG PO TABS
100.0000 mg | ORAL_TABLET | Freq: Three times a day (TID) | ORAL | Status: AC
  Administered 2020-12-19 – 2020-12-20 (×3): 100 mg via ORAL
  Filled 2020-12-19 (×3): qty 1

## 2020-12-19 MED ORDER — CEPHALEXIN 500 MG PO CAPS
500.0000 mg | ORAL_CAPSULE | Freq: Three times a day (TID) | ORAL | Status: DC
  Administered 2020-12-19 – 2020-12-20 (×2): 500 mg via ORAL
  Filled 2020-12-19 (×2): qty 1

## 2020-12-19 MED ORDER — CEPHALEXIN 500 MG PO CAPS: 500.0000 mg | ORAL_CAPSULE | Freq: Three times a day (TID) | ORAL | Status: DC

## 2020-12-19 MED ORDER — ALBUTEROL SULFATE (2.5 MG/3ML) 0.083% IN NEBU: 2.5000 mg | INHALATION_SOLUTION | Freq: Four times a day (QID) | RESPIRATORY_TRACT | Status: DC | PRN

## 2020-12-19 NOTE — Plan of Care (Signed)
  Problem: Health Behavior/Discharge Planning: Goal: Ability to manage health-related needs will improve Outcome: Completed/Met   Problem: Clinical Measurements: Goal: Diagnostic test results will improve Outcome: Completed/Met Goal: Respiratory complications will improve Outcome: Completed/Met Goal: Cardiovascular complication will be avoided Outcome: Completed/Met   Problem: Activity: Goal: Risk for activity intolerance will decrease Outcome: Completed/Met   Problem: Nutrition: Goal: Adequate nutrition will be maintained Outcome: Completed/Met   Problem: Coping: Goal: Level of anxiety will decrease Outcome: Completed/Met   Problem: Elimination: Goal: Will not experience complications related to bowel motility Outcome: Completed/Met   Problem: Pain Managment: Goal: General experience of comfort will improve Outcome: Completed/Met   Problem: Safety: Goal: Ability to remain free from injury will improve Outcome: Completed/Met   Problem: Skin Integrity: Goal: Risk for impaired skin integrity will decrease Outcome: Completed/Met

## 2020-12-19 NOTE — Plan of Care (Signed)
  Problem: Elimination: Goal: Will not experience complications related to urinary retention Outcome: Progressing   Problem: Urinary Elimination: Goal: Signs and symptoms of infection will decrease Outcome: Progressing

## 2020-12-19 NOTE — Plan of Care (Signed)
  Problem: Clinical Measurements: Goal: Ability to maintain clinical measurements within normal limits will improve Outcome: Progressing   

## 2020-12-19 NOTE — Progress Notes (Signed)
PROGRESS NOTE  Adam Ford TOI:[id number] DOB: May 13, 1961 DOA: 12/16/2020 PCP: Donald Prose, MD  HPI/Recap of past 24 hours: Adam Ford is a 60 y.o. male with medical history significant of diabetes, hypertension, hyperlipidemia, morbid obesity, history of skin cancer and anxiety disorder who presents from home to the ER with abdominal pain fever and chills.  Pain is usually in the flanks.    Worse on the right side.  He was evaluated in the ER and found to meet sepsis criteria with evidence of urinary tract infection.    Associated with nausea without vomiting.  Also reported diarrhea of a few days duration.  Cultures were obtained, urine and blood cultures.  He was started on broad-spectrum IV antibiotics for urinary tract infection.  He reported abdominal pain likely due to abdominal spasms, improved with Bentyl.  Stool analysis was negative for C. difficile PCR.  GI panel also negative.  Urine culture taken on 12/16/2020 positive for E. coli, pansensitive.  12/19/20: Patient was seen and examined at his bedside.  His wife was present.  He reports uretheral discomfort when he urinates.  Started Pyridium, will continue to monitor.  Also reports persistent nonproductive cough.  Chest x-ray done on 12/19/2020 unrevealing.  Will switch to Keflex 500 mg 3 times daily to complete treatment for UTI.  Assessment/Plan: Principal Problem:   Sepsis secondary to UTI [name]   Benign essential hypertension   Hyperlipidemia   Type 2 diabetes mellitus with other specified complication (HCC)   Acute pyelonephritis  Resolved sepsis secondary to E. coli UTI, POA Presented with leukocytosis and tachypnea, respiratory rate 28, evidence of pyuria on UA. Urine culture taken on 12/16/2020 positive for E. coli, pansensitive. Blood cultures negative to date. Completed 3 days of Rocephin  Switched to Keflex 500 mg 3 times daily to complete treatment for UTI. No evidence of pyelonephritis on CT  abdomen and pelvis with contrast done on 12/16/2020.  Improving intractable right lower quadrant abdominal pain/acute diarrhea, suspect infective. The patient and his wife reports right lower abdominal pain has been present for over 1 month Resolved on Bentyl. C. difficile PCR negative GI panel negative Continue Bentyl for spasm  Ruled out urinary retention Post void bladder scan was unremarkable.  Suspected ureteral spasms Trial of Pyridium Monitor  Elevated sed rate and CRP, likely reactive in the setting of acute illness. CRP 29.5, sed rate 48 on 12/17/2020. CRP downtrending 12.2.  Cough,  bronchitis Chest x-ray showed no active cardiopulmonary disease.  Started doxycycline 100 mg twice daily x5 days. Added albuterol neb Continue supportive care.  Diabetes polyneuropathy Continue home gabapentin  Essential hypertension Blood pressure is currently at goal. Continue home Toprol-XL 100 mg daily, benazepril 40 mg nightly, Norvasc 10 mg nightly. Continue to monitor vital signs.  Type 2 diabetes with hyperglycemia Hemoglobin A1c 6.7 on 12/17/2020. Continue insulin sliding scale.  Hyperlipidemia Continue home Crestor 10 mg daily.  Obesity BMI 39 Recommend weight loss outpatient with regular physical activity and healthy dieting.   Code Status: Full code.  Family Communication: Updated his wife at bedside.  Disposition Plan: Likely discharge to home on 12/20/2020 once symptomatology has improved.   Consultants:  None.  Procedures:  None.  Antimicrobials:  Rocephin started on admission.  Doxycycline started on 12/19/2020  Keflex started on 12/19/2020  DVT prophylaxis: Subcutaneous daily.  Status is: Inpatient    Dispo:  Patient From: Home  Planned Disposition: Home  \Anticipated discharge date 12/20/2020   Medically stable for discharge:  No, ongoing management of E. coli UTI and bronchitis.      Objective: Vitals:   12/19/20 0535 12/19/20 0922  12/19/20 1038 12/19/20 1648  BP: 130/75  138/71 (!) 149/73  Pulse: 67 68 71 65  Resp: 18 18 18 17   Temp: 98.3 F (36.8 C)  98.5 F (36.9 C) 98.9 F (37.2 C)  TempSrc:      SpO2: 93%  97% 94%  Weight:      Height:        Intake/Output Summary (Last 24 hours) at 12/19/2020 1730 Last data filed at 12/19/2020 1300 Gross per 24 hour  Intake 6532.94 ml  Output 0 ml  Net 6532.94 ml   Filed Weights   12/16/20 2300  Weight: 124.7 kg    Exam:  . General: 60 y.o. year-old male well-developed well-nourished no acute stress.  He is alert oriented x3.   . Cardiovascular: Regular rate and rhythm no rubs or gallops. Marland Kitchen Respiratory: Clear to auscultation no wheezes or rales.   . Abdomen: Soft nontender no bowel sounds present.   . Musculoskeletal: No lower extremity edema bilaterally. . Skin: No ulcerative lesions noted. Marland Kitchen Psychiatry: Mood is appropriate for condition setting.   Data Reviewed: CBC: Recent Labs  Lab 12/16/20 1249 12/17/20 0401 12/18/20 0741  WBC 16.9* 14.6* 7.7  NEUTROABS  --   --  5.8  HGB 14.1 13.8 13.0  HCT 42.6 39.6 38.8*  MCV 85.2 84.3 84.9  PLT 178 171 462   Basic Metabolic Panel: Recent Labs  Lab 12/16/20 1249 12/17/20 0401 12/18/20 0741  NA 135 137 136  K 3.6 3.6 3.5  CL 101 103 103  CO2 23 24 22   GLUCOSE 163* 162* 140*  BUN 18 13 9   CREATININE 1.32* 1.03 1.02  CALCIUM 9.0 8.9 8.5*  MG  --   --  1.9   GFR: Estimated Creatinine Clearance: 105.9 mL/min (by C-G formula based on SCr of 1.02 mg/dL). Liver Function Tests: Recent Labs  Lab 12/16/20 1249 12/17/20 0401 12/18/20 0741  AST 17 17 17   ALT 16 14 15   ALKPHOS 75 69 66  BILITOT 3.1* 1.9* 1.1  PROT 6.9 6.6 6.1*  ALBUMIN 3.4* 3.1* 2.7*   Recent Labs  Lab 12/16/20 1249  LIPASE 26   No results for input(s): AMMONIA in the last 168 hours. Coagulation Profile: Recent Labs  Lab 12/16/20 1716 12/17/20 0401  INR 1.3* 1.3*   Cardiac Enzymes: No results for input(s): CKTOTAL,  CKMB, CKMBINDEX, TROPONINI in the last 168 hours. BNP (last 3 results) No results for input(s): PROBNP in the last 8760 hours. HbA1C: Recent Labs    12/17/20 0401  HGBA1C 6.7*   CBG: Recent Labs  Lab 12/18/20 1705 12/18/20 2117 12/19/20 0648 12/19/20 1132 12/19/20 1646  GLUCAP 130* 154* 130* 126* 116*   Lipid Profile: No results for input(s): CHOL, HDL, LDLCALC, TRIG, CHOLHDL, LDLDIRECT in the last 72 hours. Thyroid Function Tests: No results for input(s): TSH, T4TOTAL, FREET4, T3FREE, THYROIDAB in the last 72 hours. Anemia Panel: No results for input(s): VITAMINB12, FOLATE, FERRITIN, TIBC, IRON, RETICCTPCT in the last 72 hours. Urine analysis:    Component Value Date/Time   COLORURINE AMBER (A) 12/16/2020 1328   APPEARANCEUR CLOUDY (A) 12/16/2020 1328   LABSPEC 1.032 (H) 12/16/2020 1328   PHURINE 5.0 12/16/2020 1328   GLUCOSEU NEGATIVE 12/16/2020 1328   HGBUR LARGE (A) 12/16/2020 1328   BILIRUBINUR NEGATIVE 12/16/2020 1328   KETONESUR 20 (A) 12/16/2020 1328   PROTEINUR  100 (A) 12/16/2020 1328   NITRITE POSITIVE (A) 12/16/2020 1328   LEUKOCYTESUR LARGE (A) 12/16/2020 1328   Sepsis Labs: @LABRCNTIP (procalcitonin:4,lacticidven:4)  ) Recent Results (from the past 240 hour(s))  Urine culture     Status: Abnormal   Collection Time: 12/16/20  4:33 PM   Specimen: In/Out Cath Urine  Result Value Ref Range Status   Specimen Description IN/OUT CATH URINE  Final   Special Requests   Final    NONE Performed at [facility], [place] [address], [place], [place] [address]    Culture >=100,000 COLONIES/mL ESCHERICHIA COLI (A)  Final   Report Status 12/19/2020 FINAL  Final   Organism ID, Bacteria ESCHERICHIA COLI (A)  Final      Susceptibility   Escherichia coli - MIC*    AMPICILLIN 4 SENSITIVE Sensitive     CEFAZOLIN <=4 SENSITIVE Sensitive     CEFEPIME <=0.12 SENSITIVE Sensitive     CEFTRIAXONE <=0.25 SENSITIVE Sensitive     CIPROFLOXACIN <=0.25 SENSITIVE Sensitive      GENTAMICIN <=1 SENSITIVE Sensitive     IMIPENEM <=0.25 SENSITIVE Sensitive     NITROFURANTOIN <=16 SENSITIVE Sensitive     TRIMETH/SULFA <=20 SENSITIVE Sensitive     AMPICILLIN/SULBACTAM <=2 SENSITIVE Sensitive     PIP/TAZO <=4 SENSITIVE Sensitive     * >=100,000 COLONIES/mL ESCHERICHIA COLI  Blood Culture (routine x 2)     Status: None (Preliminary result)   Collection Time: 12/16/20  5:16 PM   Specimen: BLOOD  Result Value Ref Range Status   Specimen Description BLOOD RIGHT ANTECUBITAL  Final   Special Requests   Final    BOTTLES DRAWN AEROBIC AND ANAEROBIC Blood Culture adequate volume   Culture   Final    NO GROWTH 3 DAYS Performed at [facility], [address], [place], [place] [address]    Report Status PENDING  Incomplete  Resp Panel by RT-PCR (Flu A&B, Covid) Nasopharyngeal Swab     Status: None   Collection Time: 12/16/20  5:54 PM   Specimen: Nasopharyngeal Swab; Nasopharyngeal(NP) swabs in vial transport medium  Result Value Ref Range Status   SARS Coronavirus 2 by RT PCR NEGATIVE NEGATIVE Final    Comment: (NOTE) SARS-CoV-2 target nucleic acids are NOT DETECTED.  The SARS-CoV-2 RNA is generally detectable in upper respiratory specimens during the acute phase of infection. The lowest concentration of SARS-CoV-2 viral copies this assay can detect is 138 copies/mL. A negative result does not preclude SARS-Cov-2 infection and should not be used as the sole basis for treatment or other patient management decisions. A negative result may occur with  improper specimen collection/handling, submission of specimen other than nasopharyngeal swab, presence of viral mutation(s) within the areas targeted by this assay, and inadequate number of viral copies(<138 copies/mL). A negative result must be combined with clinical observations, patient history, and epidemiological information. The expected result is Negative.  Fact Sheet for Patients:   EntrepreneurPulse.com.au  Fact Sheet for Healthcare Providers:  IncredibleEmployment.be  This test is no t yet approved or cleared by the [place] FDA and  has been authorized for detection and/or diagnosis of SARS-CoV-2 by FDA under an Emergency Use Authorization (EUA). This EUA will remain  in effect (meaning this test can be used) for the duration of the COVID-19 declaration under Section 564(b)(1) of the Act, 21 U.S.C.section 360bbb-3(b)(1), unless the authorization is terminated  or revoked sooner.       Influenza A by PCR NEGATIVE NEGATIVE Final  Influenza B by PCR NEGATIVE NEGATIVE Final    Comment: (NOTE) The Xpert Xpress SARS-CoV-2/FLU/RSV plus assay is intended as an aid in the diagnosis of influenza from Nasopharyngeal swab specimens and should not be used as a sole basis for treatment. Nasal washings and aspirates are unacceptable for Xpert Xpress SARS-CoV-2/FLU/RSV testing.  Fact Sheet for Patients: EntrepreneurPulse.com.au  Fact Sheet for Healthcare Providers: IncredibleEmployment.be  This test is not yet approved or cleared by the [place] FDA and has been authorized for detection and/or diagnosis of SARS-CoV-2 by FDA under an Emergency Use Authorization (EUA). This EUA will remain in effect (meaning this test can be used) for the duration of the COVID-19 declaration under Section 564(b)(1) of the Act, 21 U.S.C. section 360bbb-3(b)(1), unless the authorization is terminated or revoked.  Performed at [facility], [place] [address] [place], [place] [address]   Blood Culture (routine x 2)     Status: None (Preliminary result)   Collection Time: 12/16/20  9:20 PM   Specimen: BLOOD LEFT HAND  Result Value Ref Range Status   Specimen Description BLOOD LEFT HAND  Final   Special Requests   Final    BOTTLES DRAWN AEROBIC AND ANAEROBIC Blood Culture adequate volume   Culture    Final    NO GROWTH 3 DAYS Performed at [facility], [place] [address] [place], [place] [address]    Report Status PENDING  Incomplete  Culture, Urine     Status: None   Collection Time: 12/17/20  9:32 PM   Specimen: Urine, Random  Result Value Ref Range Status   Specimen Description URINE, RANDOM  Final   Special Requests NONE  Final   Culture   Final    NO GROWTH Performed at [facility], [place] [address], [place], [place] [address]    Report Status 12/19/2020 FINAL  Final  C Difficile Quick Screen w PCR reflex     Status: None   Collection Time: 12/18/20  6:25 AM   Specimen: STOOL  Result Value Ref Range Status   C Diff antigen NEGATIVE NEGATIVE Final   C Diff toxin NEGATIVE NEGATIVE Final   C Diff interpretation No C. difficile detected.  Final    Comment: Performed at [facility], [place] [address], [place], [place] [address]  Gastrointestinal Panel by PCR , Stool     Status: None   Collection Time: 12/18/20  6:25 AM   Specimen: Stool  Result Value Ref Range Status   Campylobacter species NOT DETECTED NOT DETECTED Final   Plesimonas shigelloides NOT DETECTED NOT DETECTED Final   Salmonella species NOT DETECTED NOT DETECTED Final   Yersinia enterocolitica NOT DETECTED NOT DETECTED Final   Vibrio species NOT DETECTED NOT DETECTED Final   Vibrio cholerae NOT DETECTED NOT DETECTED Final   Enteroaggregative E coli (EAEC) NOT DETECTED NOT DETECTED Final   Enteropathogenic E coli (EPEC) NOT DETECTED NOT DETECTED Final   Enterotoxigenic E coli (ETEC) NOT DETECTED NOT DETECTED Final   Shiga like toxin producing E coli (STEC) NOT DETECTED NOT DETECTED Final   Shigella/Enteroinvasive E coli (EIEC) NOT DETECTED NOT DETECTED Final   Cryptosporidium NOT DETECTED NOT DETECTED Final   Cyclospora cayetanensis NOT DETECTED NOT DETECTED Final   Entamoeba histolytica NOT DETECTED NOT DETECTED Final   Giardia lamblia NOT DETECTED NOT DETECTED Final   Adenovirus F40/41 NOT  DETECTED NOT DETECTED Final   Astrovirus NOT DETECTED NOT DETECTED Final   Norovirus GI/GII NOT  DETECTED NOT DETECTED Final   Rotavirus A NOT DETECTED NOT DETECTED Final   Sapovirus (I, II, IV, and V) NOT DETECTED NOT DETECTED Final    Comment: Performed at [facility], [place] [address] [place], [place] [address]      Studies: DG CHEST PORT 1 VIEW  Result Date: 12/19/2020 CLINICAL DATA:  Fever and cough EXAM: PORTABLE CHEST 1 VIEW COMPARISON:  December 16, 2020 FINDINGS: Lungs are clear. Heart is upper normal in size with pulmonary vascularity normal. No adenopathy. No bone lesions. IMPRESSION: Lungs clear.  Heart upper normal in size. Electronically Signed   By: Lowella Grip III M.D.   On: 12/19/2020 10:48    Scheduled Meds: . amLODipine  10 mg Oral QHS   And  . benazepril  40 mg Oral QHS  . cholecalciferol  2,000 Units Oral QHS  . dicyclomine  10 mg Oral TID AC & HS  . doxycycline  100 mg Oral Q12H  . enoxaparin (LOVENOX) injection  60 mg Subcutaneous Q24H  . gabapentin  600 mg Oral TID  . insulin aspart  0-15 Units Subcutaneous TID WC  . insulin aspart  0-5 Units Subcutaneous QHS  . metoprolol succinate  100 mg Oral QHS  . multivitamin with minerals  1 tablet Oral QHS  . rosuvastatin  10 mg Oral QHS  . saccharomyces boulardii  250 mg Oral BID  . tamsulosin  0.4 mg Oral Daily    Continuous Infusions: . cefTRIAXone (ROCEPHIN)  IV 1 g (12/18/20 1811)  . lactated ringers 125 mL/hr at 12/19/20 1219     LOS: 3 days     Kayleen Memos, MD [facility] Pager [phone]  If 7PM-7AM, please contact night-coverage [email or web address] Password [facility] 12/19/2020, 5:30 PM

## 2020-12-20 DIAGNOSIS — I10 Essential (primary) hypertension: Secondary | ICD-10-CM

## 2020-12-20 DIAGNOSIS — N10 Acute tubulo-interstitial nephritis: Secondary | ICD-10-CM

## 2020-12-20 DIAGNOSIS — E785 Hyperlipidemia, unspecified: Secondary | ICD-10-CM

## 2020-12-20 DIAGNOSIS — E1169 Type 2 diabetes mellitus with other specified complication: Secondary | ICD-10-CM

## 2020-12-20 LAB — GLUCOSE, CAPILLARY
Glucose-Capillary: 137 mg/dL — ABNORMAL HIGH (ref 70–99)
Glucose-Capillary: 142 mg/dL — ABNORMAL HIGH (ref 70–99)

## 2020-12-20 MED ORDER — ACETAMINOPHEN 325 MG PO TABS: 650.0000 mg | ORAL_TABLET | Freq: Four times a day (QID) | ORAL | 0 refills | Status: DC | PRN

## 2020-12-20 MED ORDER — PHENAZOPYRIDINE HCL 100 MG PO TABS: 100.0000 mg | ORAL_TABLET | Freq: Three times a day (TID) | ORAL | 0 refills | Status: DC

## 2020-12-20 MED ORDER — DICYCLOMINE HCL 10 MG PO CAPS: 10.0000 mg | ORAL_CAPSULE | Freq: Three times a day (TID) | ORAL | 0 refills | Status: DC

## 2020-12-20 MED ORDER — GUAIFENESIN-DM 100-10 MG/5ML PO SYRP: 5.0000 mL | ORAL_SOLUTION | ORAL | 0 refills | Status: DC | PRN

## 2020-12-20 MED ORDER — CEPHALEXIN 500 MG PO CAPS: 500.0000 mg | ORAL_CAPSULE | Freq: Three times a day (TID) | ORAL | 0 refills | Status: AC

## 2020-12-20 MED ORDER — ONDANSETRON HCL 4 MG PO TABS: 4.0000 mg | ORAL_TABLET | Freq: Four times a day (QID) | ORAL | 0 refills | Status: DC | PRN

## 2020-12-20 NOTE — Progress Notes (Signed)
DISCHARGE NOTE HOME Adam Ford to be discharged Home per MD order. Discussed prescriptions and follow up appointments with the patient. Prescriptions given to patient; medication list explained in detail. Patient verbalized understanding.  Skin clean, dry and intact without evidence of skin break down, no evidence of skin tears noted. IV catheter discontinued intact. Site without signs and symptoms of complications. Dressing and pressure applied. Pt denies pain at the site currently. No complaints noted.  Patient free of lines, drains, and wounds.   An After Visit Summary (AVS) was printed and given to the patient. Patient escorted via wheelchair, and discharged home via private auto.  [name], RN

## 2020-12-20 NOTE — Discharge Summary (Signed)
Physician Discharge Summary  Adam Ford SNK:[id number] DOB: 1961/06/26 DOA: 12/16/2020  PCP: Donald Prose, MD  Admit date: 12/16/2020 Discharge date: 12/20/2020  Admitted From: Home  Discharge disposition: Home  Recommendations for Outpatient Follow-Up:   . Follow up with your primary care provider in one week.  . Check CBC, BMP, magnesium in the next visit . Follow-up with neurology for possible mononeuropathy in the abdominal wall.  Discharge Diagnosis:   Principal Problem:   Sepsis secondary to UTI [facility]) Active Problems:   Benign essential hypertension   Hyperlipidemia   Type 2 diabetes mellitus with other specified complication (HCC)   Acute pyelonephritis   Discharge Condition: Improved.  Diet recommendation: Low sodium, heart healthy.  Carbohydrate-modified.    Wound care: None.  Code status: Full.   History of Present Illness:   Adam Muhlesteinis a 60 y.o.malewith medical history significant ofdiabetes mellitus, hypertension, hyperlipidemia, morbid obesity, history of skin cancer and anxiety disorder presented to hospital with complaints of abdominal pain, fever and chills.  Pain was on the flank.  In the ED, patient was noted to have UTI with sepsis.  Patient had nausea and diarrhea without vomiting.  Urine culture blood cultures were obtained and urine culture showed E. coli.  Patient persisted to have abdominal wall pain and spasms.  Stool test was negative for C. difficile and GI pathogen panel was negative as well   Hospital Course:   Following conditions were addressed during hospitalization as listed below,  Sepsis secondary to E. coli UTI, present on admission. Urine culture with pansensitive E. coli.  Received a 3 days course of Rocephin.  Patient will be discharged home on Keflex to complete total of 7-day course.  CT scan of the abdomen and pelvis did not show any evidence of pyelonephritis.  CRP and ESR elevated on presentation.  Abdominal wall  pain paresthesia/hypersensitivity.  Could be diabetic mononeuropathy.   Patient is on gabapentin 3 times a day.  Might need further adjustment of doses.  Was encouraged to follow-up with his neurologist as outpatient  Diarrhea, fever, abdominal pain.  GI pathogen panel was negative. C. difficile panel was negative.  Urine culture showed E. coli.  Continue Pyridium, Bentyl on discharge.  Cough,   acute bronchitis Chest x-ray showed no active cardiopulmonary disease. 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  Will resume on discharge.  Type 2 diabetes with hyperglycemia Hemoglobin A1c 6.7 on 12/17/2020.  On Metformin at home.  Will resume on discharge.  Hyperlipidemia Continue home Crestor 10 mg daily.  Obesity BMI 39. Recommend weight loss on a ongoing basis   Disposition.  At this time, patient is stable for disposition home with outpatient PCP/ neurology follow-up  Medical Consultants:    None.  Procedures:    None Subjective:   Today, patient was seen and examined at bedside.  Complains of right lower abdominal wall sensitivity paresthesia and pain.  No nausea vomiting diarrhea fever chills  Discharge Exam:   Vitals:   12/20/20 0556 12/20/20 0928  BP: 127/76 140/79  Pulse: 66 (!) 57  Resp: 18 18  Temp: 98.5 F (36.9 C) 97.8 F (36.6 C)  SpO2: 96% 96%   Vitals:   12/19/20 2120 12/19/20 2133 12/20/20 0556 12/20/20 0928  BP: 126/83  127/76 140/79  Pulse: 74 77 66 (!) 57  Resp: 19 (!) 21 18  18  Temp: 98.2 F (36.8 C)  98.5 F (36.9 C) 97.8 F (36.6 C)  TempSrc:    Oral  SpO2: 95% 93% 96% 96%  Weight: 124.7 kg     Height:        General: Alert awake, not in obvious distress, obese, mild anxious HENT: pupils equally reacting to light,  No scleral pallor or icterus noted. Oral mucosa is moist.   Chest:  Clear breath sounds.  Diminished breath sounds bilaterally. No crackles or wheezes.  CVS: S1 &S2 heard. No murmur.  Regular rate and rhythm. Abdomen: Soft, nontender, nondistended.  Bowel sounds are heard.  Tenderness with sensitivity on touching of the lower abdominal wall without pressure. Extremities: No cyanosis, clubbing or edema.  Peripheral pulses are palpable. Psych: Alert, awake and oriented, normal mood CNS:  No cranial nerve deficits.  Power equal in all extremities.   Skin: Warm and dry.  No rashes noted.  The results of significant diagnostics from this hospitalization (including imaging, microbiology, ancillary and laboratory) are listed below for reference.     Diagnostic Studies:   CT ABDOMEN PELVIS W CONTRAST  Result Date: 12/16/2020 CLINICAL DATA:  RIGHT lower quadrant pain with chills.  Fever EXAM: CT ABDOMEN AND PELVIS WITH CONTRAST TECHNIQUE: Multidetector CT imaging of the abdomen and pelvis was performed using the standard protocol following bolus administration of intravenous contrast. CONTRAST:  18m OMNIPAQUE IOHEXOL 300 MG/ML  SOLN COMPARISON:  CT 07/11/2019 FINDINGS: Lower chest: Lung bases are clear. Hepatobiliary: No focal hepatic lesion. Postcholecystectomy. No biliary dilatation. Pancreas: Pancreas is normal. No ductal dilatation. No pancreatic inflammation. Spleen: Normal spleen Adrenals/urinary tract: Adrenal glands normal. Bilateral nonobstructing renal calculi. No ureterolithiasis or obstructive uropathy. No bladder calculi. Stomach/Bowel: Post gastric sleeve bariatric surgery. Duodenum and small-bowel normal. Ascending and transverse colon normal. No bowel obstruction. Rectosigmoid colon normal. Vascular/Lymphatic: Abdominal aorta is normal caliber with atherosclerotic calcification. There is no retroperitoneal or periportal lymphadenopathy. No pelvic lymphadenopathy. Reproductive: Lesion prostate gland is enlarged measuring 6.8 by 5.7 by 5.1 cm (volume =  100 cm^3) Other: No free fluid. Musculoskeletal: No aggressive osseous lesion. IMPRESSION: 1. No acute findings in the abdomen pelvis. 2. Bilateral nonobstructing renal calculi. 3. No bowel obstruction or bowel inflammation. 4. Prostatomegaly. 5.  Aortic Atherosclerosis (ICD10-I70.0). Electronically Signed   By: SSuzy BouchardM.D.   On: 12/16/2020 19:55   DG Chest Port 1 View  Result Date: 12/16/2020 CLINICAL DATA:  Questionable sepsis; evaluate for abnormality EXAM: PORTABLE CHEST 1 VIEW COMPARISON:  None. FINDINGS: The heart size and mediastinal contours are within normal limits. Linear atelectasis versus scarring at the left base. No focal consolidation. No pulmonary edema. No pleural effusion. No pneumothorax. No acute osseous abnormality. IMPRESSION: No active disease. Electronically Signed   By: MIven FinnM.D.   On: 12/16/2020 16:57     Labs:   Basic Metabolic Panel: Recent Labs  Lab 12/16/20 1249 12/17/20 0401 12/18/20 0741  NA 135 137 136  K 3.6 3.6 3.5  CL 101 103 103  CO2 '23 24 22  ' GLUCOSE 163* 162* 140*  BUN '18 13 9  ' CREATININE 1.32* 1.03 1.02  CALCIUM 9.0 8.9 8.5*  MG  --   --  1.9   GFR Estimated Creatinine Clearance: 105.9 mL/min (by C-G formula based on SCr of 1.02 mg/dL). Liver Function Tests: Recent Labs  Lab 12/16/20 1249 12/17/20 0401 12/18/20 0741  AST '17 17 17  ' ALT '16 14 15  ' ALKPHOS 75 69 66  BILITOT  3.1* 1.9* 1.1  PROT 6.9 6.6 6.1*  ALBUMIN 3.4* 3.1* 2.7*   Recent Labs  Lab 12/16/20 1249  LIPASE 26   No results for input(s): AMMONIA in the last 168 hours. Coagulation profile Recent Labs  Lab 12/16/20 1716 12/17/20 0401  INR 1.3* 1.3*    CBC: Recent Labs  Lab 12/16/20 1249 12/17/20 0401 12/18/20 0741  WBC 16.9* 14.6* 7.7  NEUTROABS  --   --  5.8  HGB 14.1 13.8 13.0  HCT 42.6 39.6 38.8*  MCV 85.2 84.3 84.9  PLT 178 171 173   Cardiac Enzymes: No results for input(s): CKTOTAL, CKMB, CKMBINDEX, TROPONINI in the last 168  hours. BNP: Invalid input(s): POCBNP CBG: Recent Labs  Lab 12/19/20 0648 12/19/20 1132 12/19/20 1646 12/19/20 2119 12/20/20 0658  GLUCAP 130* 126* 116* 151* 137*   D-Dimer No results for input(s): DDIMER in the last 72 hours. Hgb A1c No results for input(s): HGBA1C in the last 72 hours. Lipid Profile No results for input(s): CHOL, HDL, LDLCALC, TRIG, CHOLHDL, LDLDIRECT in the last 72 hours. Thyroid function studies No results for input(s): TSH, T4TOTAL, T3FREE, THYROIDAB in the last 72 hours.  Invalid input(s): FREET3 Anemia work up No results for input(s): VITAMINB12, FOLATE, FERRITIN, TIBC, IRON, RETICCTPCT in the last 72 hours. Microbiology Recent Results (from the past 240 hour(s))  Urine culture     Status: Abnormal   Collection Time: 12/16/20  4:33 PM   Specimen: In/Out Cath Urine  Result Value Ref Range Status   Specimen Description IN/OUT CATH URINE  Final   Special Requests   Final    NONE Performed at [facility], [address], [place], [place] [address]    Culture >=100,000 COLONIES/mL ESCHERICHIA COLI (A)  Final   Report Status 12/19/2020 FINAL  Final   Organism ID, Bacteria ESCHERICHIA COLI (A)  Final      Susceptibility   Escherichia coli - MIC*    AMPICILLIN 4 SENSITIVE Sensitive     CEFAZOLIN <=4 SENSITIVE Sensitive     CEFEPIME <=0.12 SENSITIVE Sensitive     CEFTRIAXONE <=0.25 SENSITIVE Sensitive     CIPROFLOXACIN <=0.25 SENSITIVE Sensitive     GENTAMICIN <=1 SENSITIVE Sensitive     IMIPENEM <=0.25 SENSITIVE Sensitive     NITROFURANTOIN <=16 SENSITIVE Sensitive     TRIMETH/SULFA <=20 SENSITIVE Sensitive     AMPICILLIN/SULBACTAM <=2 SENSITIVE Sensitive     PIP/TAZO <=4 SENSITIVE Sensitive     * >=100,000 COLONIES/mL ESCHERICHIA COLI  Blood Culture (routine x 2)     Status: None (Preliminary result)   Collection Time: 12/16/20  5:16 PM   Specimen: BLOOD  Result Value Ref Range Status   Specimen Description BLOOD RIGHT ANTECUBITAL  Final    Special Requests   Final    BOTTLES DRAWN AEROBIC AND ANAEROBIC Blood Culture adequate volume   Culture   Final    NO GROWTH 3 DAYS Performed at [facility], [address] [place], [place] [address]    Report Status PENDING  Incomplete  Resp Panel by RT-PCR (Flu A&B, Covid) Nasopharyngeal Swab     Status: None   Collection Time: 12/16/20  5:54 PM   Specimen: Nasopharyngeal Swab; Nasopharyngeal(NP) swabs in vial transport medium  Result Value Ref Range Status   SARS Coronavirus 2 by RT PCR NEGATIVE NEGATIVE Final    Comment: (NOTE) SARS-CoV-2 target nucleic acids are NOT DETECTED.  The SARS-CoV-2 RNA is generally detectable in upper respiratory specimens during  the acute phase of infection. The lowest concentration of SARS-CoV-2 viral copies this assay can detect is 138 copies/mL. A negative result does not preclude SARS-Cov-2 infection and should not be used as the sole basis for treatment or other patient management decisions. A negative result may occur with  improper specimen collection/handling, submission of specimen other than nasopharyngeal swab, presence of viral mutation(s) within the areas targeted by this assay, and inadequate number of viral copies(<138 copies/mL). A negative result must be combined with clinical observations, patient history, and epidemiological information. The expected result is Negative.  Fact Sheet for Patients:  EntrepreneurPulse.com.au  Fact Sheet for Healthcare Providers:  IncredibleEmployment.be  This test is no t yet approved or cleared by the [place] FDA and  has been authorized for detection and/or diagnosis of SARS-CoV-2 by FDA under an Emergency Use Authorization (EUA). This EUA will remain  in effect (meaning this test can be used) for the duration of the COVID-19 declaration under Section 564(b)(1) of the Act, 21 U.S.C.section 360bbb-3(b)(1), unless the authorization is terminated   or revoked sooner.       Influenza A by PCR NEGATIVE NEGATIVE Final   Influenza B by PCR NEGATIVE NEGATIVE Final    Comment: (NOTE) The Xpert Xpress SARS-CoV-2/FLU/RSV plus assay is intended as an aid in the diagnosis of influenza from Nasopharyngeal swab specimens and should not be used as a sole basis for treatment. Nasal washings and aspirates are unacceptable for Xpert Xpress SARS-CoV-2/FLU/RSV testing.  Fact Sheet for Patients: EntrepreneurPulse.com.au  Fact Sheet for Healthcare Providers: IncredibleEmployment.be  This test is not yet approved or cleared by the [place] FDA and has been authorized for detection and/or diagnosis of SARS-CoV-2 by FDA under an Emergency Use Authorization (EUA). This EUA will remain in effect (meaning this test can be used) for the duration of the COVID-19 declaration under Section 564(b)(1) of the Act, 21 U.S.C. section 360bbb-3(b)(1), unless the authorization is terminated or revoked.  Performed at [facility], [place] [address] [place], [place] [address]   Blood Culture (routine x 2)     Status: None (Preliminary result)   Collection Time: 12/16/20  9:20 PM   Specimen: BLOOD LEFT HAND  Result Value Ref Range Status   Specimen Description BLOOD LEFT HAND  Final   Special Requests   Final    BOTTLES DRAWN AEROBIC AND ANAEROBIC Blood Culture adequate volume   Culture   Final    NO GROWTH 3 DAYS Performed at [facility], [place] [address] [place], [place] [address]    Report Status PENDING  Incomplete  Culture, Urine     Status: None   Collection Time: 12/17/20  9:32 PM   Specimen: Urine, Random  Result Value Ref Range Status   Specimen Description URINE, RANDOM  Final   Special Requests NONE  Final   Culture   Final    NO GROWTH Performed at [facility], [place] [address], [place], [place] [address]    Report Status 12/19/2020 FINAL  Final  C Difficile Quick Screen w PCR  reflex     Status: None   Collection Time: 12/18/20  6:25 AM   Specimen: STOOL  Result Value Ref Range Status   C Diff antigen NEGATIVE NEGATIVE Final   C Diff toxin NEGATIVE NEGATIVE Final   C Diff interpretation No C. difficile detected.  Final    Comment: Performed at [facility], [place] [address] [place], [place] [address]  Gastrointestinal Panel by PCR , Stool     Status: None   Collection Time: 12/18/20  6:25 AM   Specimen: Stool  Result Value Ref Range Status   Campylobacter species NOT DETECTED NOT DETECTED Final   Plesimonas shigelloides NOT DETECTED NOT DETECTED Final   Salmonella species NOT DETECTED NOT DETECTED Final   Yersinia enterocolitica NOT DETECTED NOT DETECTED Final   Vibrio species NOT DETECTED NOT DETECTED Final   Vibrio cholerae NOT DETECTED NOT DETECTED Final   Enteroaggregative E coli (EAEC) NOT DETECTED NOT DETECTED Final   Enteropathogenic E coli (EPEC) NOT DETECTED NOT DETECTED Final   Enterotoxigenic E coli (ETEC) NOT DETECTED NOT DETECTED Final   Shiga like toxin producing E coli (STEC) NOT DETECTED NOT DETECTED Final   Shigella/Enteroinvasive E coli (EIEC) NOT DETECTED NOT DETECTED Final   Cryptosporidium NOT DETECTED NOT DETECTED Final   Cyclospora cayetanensis NOT DETECTED NOT DETECTED Final   Entamoeba histolytica NOT DETECTED NOT DETECTED Final   Giardia lamblia NOT DETECTED NOT DETECTED Final   Adenovirus F40/41 NOT DETECTED NOT DETECTED Final   Astrovirus NOT DETECTED NOT DETECTED Final   Norovirus GI/GII NOT DETECTED NOT DETECTED Final   Rotavirus A NOT DETECTED NOT DETECTED Final   Sapovirus (I, II, IV, and V) NOT DETECTED NOT DETECTED Final    Comment: Performed at [facility], [address], [place], [place] [address]     Discharge Instructions:   Discharge Instructions    Call MD for:  persistant nausea and vomiting   Complete by: As directed    Call MD for:  severe uncontrolled pain   Complete by: As directed     Call MD for:  temperature >100.4   Complete by: As directed    Diet - low sodium heart healthy   Complete by: As directed    Diet Carb Modified   Complete by: As directed    Discharge instructions   Complete by: As directed    Follow-up with your primary care physician in 1 week.  Complete the course of antibiotic.  Increase fluid intake. Discuss with your neurologist if you persist to have abdominal wall pain.   Increase activity slowly   Complete by: As directed      Allergies as of 12/20/2020      Reactions   Bee Venom Anaphylaxis      Medication List    TAKE these medications   acetaminophen 325 MG tablet Commonly known as: TYLENOL Take 2 tablets (650 mg total) by mouth every 6 (six) hours as needed for mild pain (or Fever >/= 101).   amLODipine-benazepril 10-40 MG capsule Commonly known as: LOTREL Take 1 capsule by mouth at bedtime.   cephALEXin 500 MG capsule Commonly known as: KEFLEX Take 1 capsule (500 mg total) by mouth every 8 (eight) hours for 5 days.   dicyclomine 10 MG capsule Commonly known as: BENTYL Take 1 capsule (10 mg total) by mouth 4 (four) times daily -  before meals and at bedtime.   gabapentin 300 MG capsule Commonly known as: NEURONTIN Take 2 capsules (600 mg total) by mouth 3 (three) times daily.   guaiFENesin-dextromethorphan 100-10 MG/5ML syrup Commonly known as: ROBITUSSIN DM Take 5 mLs by mouth every 4 (four) hours as needed for cough.   metFORMIN 500 MG 24 hr tablet Commonly known as: GLUCOPHAGE-XR Take 500 mg by mouth at bedtime.   metoprolol succinate 50 MG 24 hr tablet Commonly known as: TOPROL-XL Take 100 mg by  mouth at bedtime.   multivitamin with minerals Tabs tablet Take 1 tablet by mouth at bedtime.   ondansetron 4 MG tablet Commonly known as: ZOFRAN Take 1 tablet (4 mg total) by mouth every 6 (six) hours as needed for nausea.   phenazopyridine 100 MG tablet Commonly known as: PYRIDIUM Take 1 tablet (100 mg total) by  mouth 3 (three) times daily with meals.   rosuvastatin 10 MG tablet Commonly known as: CRESTOR Take 10 mg by mouth at bedtime.   VITAMIN D3 PO Take 2,000 Units by mouth at bedtime.       Follow-up Information    Donald Prose, MD. Schedule an appointment as soon as possible for a visit in 1 week(s).   Specialty: Family Medicine Why: regular followup Contact information: [address] [place] [address] [phone]                Time coordinating discharge: 39 minutes  Signed:  Shanell Aden  [facility] 12/20/2020, 9:32 AM

## 2020-12-21 LAB — CULTURE, BLOOD (ROUTINE X 2)
Culture: NO GROWTH
Culture: NO GROWTH
Special Requests: ADEQUATE
Special Requests: ADEQUATE

## 2021-01-01 ENCOUNTER — Encounter: Payer: Self-pay | Admitting: Family Medicine

## 2021-01-09 NOTE — Progress Notes (Addendum)
 PATIENT: Adam Ford DOB: 11-Nov-1960  REASON FOR VISIT: follow up HISTORY FROM: patient  Chief Complaint  Patient presents with   Follow-up    Rm 1 alone Pt is well, neuralgia is controllable on gabapentin . 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 He was taking gabapentin  600mg  three times daily. He reports that head pain is well controlled. Gabapentin  was increased recently by GI for concerns of abdominal pain thought to be neuropathic in nature. He has follow up with them next month. He is tolerating 900mg  three times daily.    He reports concerns of memory loss and confusion at times. He is back to work full time. 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 He states that he wrote the report over three times as he did not remember witting it last week. He called a coworker yesterday to have the same conversation he had already had. He is able to drive without difficulty. He has no difficulty remembering where he has placed items or parked car. He is able to manage medications and finances independently. He performs ADLs independently. MRI was normal 12/2019. Mother has AD.    12/20/2019 ALL:  Adam Ford is a 60 y.o. male here today for follow up for supraoccipital neuralgia. He continues gabapentin . He is taking 600mg  2-3 times daily. He does feel that gabapentin  helps as if he misses a dose, pain is increased. He continues to have sharp stabbing pain over the right eye.   Today, in the office, he is having difficulty speaking, difficulty breathing, reports a feeling that his abdomen is tight. He denies chest pain. He feels slow in reaction time and weak. Symptoms started about an hour ago. She does have a headache today but not out of proportion to other headaches. He feels better when he is able to stretch out. He reports having intermittent symptoms starting in 06/2019  but over the past month these symptoms have occurred multiple times daily and are intensifying. He denies obvious weakness. He was able to work today and drove to his appointment. He was seen for a similar event in 06/2019 but was not having difficulty speaking, reported difficulty getting deep breath and abdominal tightness. CT abdomen showed kidney stones. He was seen by PCP a couple of weeks ago following my recommendation via Mychart message for similar complaints and started on duloxetine. He did not feel it helped and discontinued this about a week ago.  He has not had recent head imaging since symptoms started. Last MR brain, MR angio and MR orbits unremarkable in 04/2018.    HISTORY: (copied from my note on 06/01/2019)  Adam Ford is a 60 y.o. male here today for follow up of suborbital headaches. He was started on amitriptyline  25mg  at bedtime at last visit in 04/2019. Unfortunately, he was unable to tolerate side effects. He has continues gabapentin  300mg  up to five times daily during the week and 600mg  three times daily on the weekends. He does feel a little sluggish with medication but feels headaches are stable. He continues to have intermittent pain about 1-2 times daily. Pain is just over the right eye. It resolves in a few minutes to an hour. No other symptoms associated with pain.  It is much better on medication.    History (copied from my note on 04/22/2019)   Adam Ford is a 60  y.o. male here today for follow up of suborbital headaches. He has been taking gabapentin  300mg  in the am, 300mg  at lunch and 600mg  at bedtime. He reported grogginess with 600mg  TID dosing. He was doing well untl 3-4 days ago when headaches returned and are refractory to treatment. He has increased gabapentin  to 600mg  TID with an extra 300mg  dose around 5pm. He reports no changes in the headache other than this is more of a dull achy pain rather than sharp stabbing pain. He reports pain above his right eye as  in the past. He has not noticed significant light or sound sensitivity but does report "feeling better when I wear my sunglasses." He denies nausea or vomiting. No vision changes. He reports that headache will spontaneously resolve with time.   HISTORY (copied from my note on 12/17/2018)   Adam Ford is a 60 y.o. male here today for follow up for supraoccipital neuralgia. He continues Gabapentin  600mg  up to 3 times a day. He has tried carbamazepine and topiramate in the past that caused side effects. He feels that gabapentin  is working better than anything. He does have concerns of feeling groggy with gabapentin . He has taken Lyrica in the past with similar side effects. He is trying to adjust his dose where he doesn't feel as sleepy.    He was seen for consideration of ablation therapy and nerve stimulator but he is not interested at this time.    HISTORY: (copied from Dr Harding Li note on 04/23/2018) HPI:  Adam Ford is a 60 y.o. male here as a referral from Dr. Randle Butler for eye pain rule out cluster headaches.  Past medical history anxiety, hypertension, high cholesterol. Back in February after the new year he had a regular eye check and a month later he had pain over his right eye and headaches. Headaches worsened. He saw ophtho again Dr. Faylene Hoots and everything was fine. Dr. Tellis Feathers performed sinus procedures in February in the same timeframe the headaches started but unclear. Headaches worsening and a dull pain over the forehead. Mostly on the right, right at one point (points to the supratrochlear nerve). Every other week he gets a sharp strong pain he had to close his eye and squint and slow down, no lacrimation or other autonomic symptoms. Lasts briefly 10 minutes not stabbing. The pain above the right eyebrown is continuous all day long and the forehead is dull. He wake up with the headaches, alleve and tylenol . Ringing in the ears. Worsening.  No migrainous symptoms. Blurry.double vision right eye,  can wake with the headaches and position makes it worse. No other focal neurologic deficits, associated symptoms, inciting events or modifiable factors.   Reviewed notes, labs and imaging from outside physicians, which showed:   Reviewed [facility] notes.  Patient was seen for an emergency visit.  Chief complaint was feeling weak and a sore sensation in the right eye for 3 weeks.  Headache above the right brow that last from several minutes to an hour.  Resolves on its own not currently on treatment.  Examination showed no APD, extraocular movements intact, external exam normal, intraocular pressure borderline, slit-lamp exam unremarkable, no optic nerve edema normal-appearing optic nerves, diagnosed with tension headache versus cluster headaches, pain in her around the eye, borderline glaucoma angle with borderline findings.    Months ago started having pain of her right eye went to eye doctor and underwent testing to determine eyes not the issue always a dull ache above his right eye  once a week or every other week he will have worsened shooting pain, starting to have low-grade pain over both eyes he takes 2 Aleve's and 2 Tylenols twice daily also for bulging disc if he does not take those the pain is worse.   REVIEW OF SYSTEMS: Out of a complete 14 system review of symptoms, the patient complains only of the following symptoms, see hpi and all other reviewed systems are negative.  ALLERGIES: Allergies  Allergen Reactions   Bee Venom Anaphylaxis    HOME MEDICATIONS: Outpatient Medications Prior to Visit  Medication Sig Dispense Refill   acetaminophen  (TYLENOL ) 325 MG tablet Take 2 tablets (650 mg total) by mouth every 6 (six) hours as needed for mild pain (or Fever >/= 101). 60 tablet 0   amLODipine -benazepril  (LOTREL) 10-40 MG capsule Take 1 capsule by mouth at bedtime.      Cholecalciferol  (VITAMIN D3 PO) Take 2,000 Units by mouth at bedtime.     dicyclomine  (BENTYL ) 10 MG capsule Take  1 capsule (10 mg total) by mouth 4 (four) times daily -  before meals and at bedtime. 20 capsule 0   guaiFENesin -dextromethorphan  (ROBITUSSIN DM) 100-10 MG/5ML syrup Take 5 mLs by mouth every 4 (four) hours as needed for cough. 118 mL 0   metFORMIN (GLUCOPHAGE-XR) 500 MG 24 hr tablet Take 500 mg by mouth at bedtime.      metoprolol  succinate (TOPROL -XL) 50 MG 24 hr tablet Take 100 mg by mouth at bedtime.      Multiple Vitamin (MULTIVITAMIN WITH MINERALS) TABS tablet Take 1 tablet by mouth at bedtime.     ondansetron  (ZOFRAN ) 4 MG tablet Take 1 tablet (4 mg total) by mouth every 6 (six) hours as needed for nausea. 20 tablet 0   phenazopyridine  (PYRIDIUM ) 100 MG tablet Take 1 tablet (100 mg total) by mouth 3 (three) times daily with meals. 20 tablet 0   rosuvastatin  (CRESTOR ) 10 MG tablet Take 10 mg by mouth at bedtime.      gabapentin  (NEURONTIN ) 300 MG capsule Take 2 capsules (600 mg total) by mouth 3 (three) times daily. (Patient taking differently: Take 600 mg by mouth 3 (three) times daily. Taking 3 capsules 3x daily) 540 capsule 1   No facility-administered medications prior to visit.    PAST MEDICAL HISTORY: Past Medical History:  Diagnosis Date   Anxiety    Blepharitis    Bulging of thoracic intervertebral disc    T8   Hx of non-insulin  dependent diabetes mellitus    Hypercholesteremia    Hypertension    Skin cancer     PAST SURGICAL HISTORY: Past Surgical History:  Procedure Laterality Date   APPENDECTOMY  1969   as a child   GALLBLADDER SURGERY     GASTRIC SLEEVE     lasers yag iridotomy Bilateral    LITHOTRIPSY     MOHS SURGERY     Dr. Katheryn Pandy   NOSE SURGERY     Dr. Tellis Feathers; x2   TONSILLECTOMY     as a child    VASECTOMY      FAMILY HISTORY: Family History  Problem Relation Age of Onset   Hypertension Mother    Hypertension Father    Aneurysm Father    Colon cancer Maternal Grandmother    Stroke Paternal Grandfather    Stroke Other    High Cholesterol  Other        on both sides   Other Neg Hx     SOCIAL HISTORY: Social  History   Socioeconomic History   Marital status: Married    Spouse name: Not on file   Number of children: 4   Years of education: Not on file   Highest education level: Master's degree (e.g., MA, MS, MEng, MEd, MSW, MBA)  Occupational History   Not on file  Tobacco Use   Smoking status: Never Smoker   Smokeless tobacco: Never Used  Vaping Use   Vaping Use: Never used  Substance and Sexual Activity   Alcohol use: Never   Drug use: Never   Sexual activity: Not on file  Other Topics Concern   Not on file  Social History Narrative   Lives at home with his wife & children   Right handed   Caffeine: quit in 2015 but now drinks diet soda which helps his headaches. He drinks about 6-7 cans during the work days.    Social Determinants of Health   Financial Resource Strain: Not on file  Food Insecurity: Not on file  Transportation Needs: Not on file  Physical Activity: Not on file  Stress: Not on file  Social Connections: Not on file  Intimate Partner Violence: Not on file     PHYSICAL EXAM  Vitals:   01/10/21 0950  BP: (!) 144/86  Pulse: 61  Weight: 273 lb (123.8 kg)  Height: 5\' 10"  (1.778 m)   Body mass index is 39.17 kg/m.  Generalized: Well developed Cardiology: normal rate and rhythm, no murmur noted Respiratory: clear to auscultation bilaterally Neurological examination  Mentation: Alert, oriented to person and place.  Cranial nerve II-XII: Pupils were equal round reactive to light. Extraocular movements were full, visual field were full on confrontational test. Facial sensation and strength were normal. Uvula tongue midline. Head turning and shoulder shrug  were normal and symmetric. Motor: The motor testing reveals 5 over 5 strength of all 4 extremities. Good symmetric motor tone is noted throughout.  Sensory: Sensory testing is intact to soft touch on all 4 extremities. No evidence of  extinction is noted.  Coordination: Cerebellar testing reveals good finger-nose-finger and heel-to-shin bilaterally.  Gait and station: gait normal     DIAGNOSTIC DATA (LABS, IMAGING, TESTING) - I reviewed patient records, labs, notes, testing and imaging myself where available.  No flowsheet data found.   Lab Results  Component Value Date   WBC 7.7 12/18/2020   HGB 13.0 12/18/2020   HCT 38.8 (L) 12/18/2020   MCV 84.9 12/18/2020   PLT 173 12/18/2020      Component Value Date/Time   NA 136 12/18/2020 0741   NA 142 04/23/2018 0902   K 3.5 12/18/2020 0741   CL 103 12/18/2020 0741   CO2 22 12/18/2020 0741   GLUCOSE 140 (H) 12/18/2020 0741   BUN 9 12/18/2020 0741   BUN 18 04/23/2018 0902   CREATININE 1.02 12/18/2020 0741   CALCIUM  8.5 (L) 12/18/2020 0741   PROT 6.1 (L) 12/18/2020 0741   PROT 7.0 04/23/2018 0902   ALBUMIN 2.7 (L) 12/18/2020 0741   ALBUMIN 4.5 04/23/2018 0902   AST 17 12/18/2020 0741   ALT 15 12/18/2020 0741   ALKPHOS 66 12/18/2020 0741   BILITOT 1.1 12/18/2020 0741   BILITOT 0.8 04/23/2018 0902   GFRNONAA >60 12/18/2020 0741   GFRAA >60 12/20/2019 1851   No results found for: CHOL, HDL, LDLCALC, LDLDIRECT, TRIG, CHOLHDL Lab Results  Component Value Date   HGBA1C 6.7 (H) 12/17/2020   No results found for: [id number] Lab Results  Component Value  Date   TSH 0.956 04/23/2018       ASSESSMENT AND PLAN 60 y.o. year old male  has a past medical history of Anxiety, Blepharitis, Bulging of thoracic intervertebral disc, non-insulin  dependent diabetes mellitus, Hypercholesteremia, Hypertension, and Skin cancer. here with   No diagnosis found.   Adam presents today for follow-up for supraorbital neuralgia. He is doing well on gabapentin . Dose increased by GI to 900mg  TID for abdominal pain thought to be neuropathic. I will continue gabapentin  900mg  TID but have advised that any other changes to dose or treatment plan should be carried out by appropriate  provider for abdominal pain. He has concerns of memory loss. He describes events where he does not remember that he has already done something. No difficulty managing home, finances, working full time, driving. Neuro exam intact. He is completely oriented to time, place and able to provide detailed history. I do not suspect neurodegenerative dementia. His mother does have AD. He was advised to consider neurocognitive evaluation with neuropsychologist. He states that he does not need a referral. I have given him names to consider in out area. He should continue discussion of sleep apnea management with PCP. Reports using CPAP nightly but does not have anyone who is managing follow up. Aaron Aas He verbalizes understanding and agreement with plan.   No orders of the defined types were placed in this encounter.    Meds ordered this encounter  Medications   gabapentin  (NEURONTIN ) 300 MG capsule    Sig: Take 3 capsules (900 mg total) by mouth 3 (three) times daily.    Dispense:  270 capsule    Refill:  11    Order Specific Question:   Supervising Provider    Answer:   Glory Larsen [id number]      I spent 45 minutes with the patient. 50% of this time was spent counseling and educating patient on plan of care and medications.    Jayson Michael 01/10/2021, 12:38 PM [facility] [address] [place], [place] [address] [phone]  Made any corrections needed, and agree with history, physical, neuro exam,assessment and plan as stated.     Aldona Amel, MD [facility]

## 2021-01-10 ENCOUNTER — Encounter: Payer: Self-pay | Admitting: Family Medicine

## 2021-01-10 ENCOUNTER — Other Ambulatory Visit: Payer: Self-pay

## 2021-01-10 ENCOUNTER — Ambulatory Visit: Payer: 59 | Admitting: Family Medicine

## 2021-01-10 ENCOUNTER — Other Ambulatory Visit: Payer: Self-pay | Admitting: Family Medicine

## 2021-01-10 VITALS — BP 144/86 | HR 61 | Ht 70.0 in | Wt 273.0 lb

## 2021-01-10 DIAGNOSIS — G500 Trigeminal neuralgia: Secondary | ICD-10-CM

## 2021-01-10 DIAGNOSIS — R4189 Other symptoms and signs involving cognitive functions and awareness: Secondary | ICD-10-CM | POA: Diagnosis not present

## 2021-01-10 MED ORDER — GABAPENTIN 300 MG PO CAPS: 900.0000 mg | ORAL_CAPSULE | Freq: Three times a day (TID) | ORAL | 11 refills | Status: DC

## 2021-01-10 NOTE — Progress Notes (Signed)
Referral to neuropsychology

## 2021-01-10 NOTE — Patient Instructions (Signed)
Below is our plan:  We will continue gabapentin. I am ok with increasing dosage to 900mg  three times daily. If any additional adjustments are needed for stomach pain, please consult with GI or consider pain management. I recommend you look into having a formal neurocognitive evaluation with neuropsychology. Ilean Skill and [facility] Coca are two in out area you can consider. Discuss sleep apnea management with Dr Nancy Fetter.   Please make sure you are staying well hydrated. I recommend 50-60 ounces daily. Well balanced diet and regular exercise encouraged. Consistent sleep schedule with 6-8 hours recommended. Memory compensation strategies are also helpful. I have given you information below. Manage any anxiety or depression.   Please continue follow up with care team as directed.   Follow up with me in 1 year   You may receive a survey regarding today's visit. I encourage you to leave honest feed back as I do use this information to improve patient care. Thank you for seeing me today!       Memory Compensation Strategies  1. Use "WARM" strategy.  W= write it down  A= associate it  R= repeat it  M= make a mental note  2.   You can keep a Social worker.  Use a 3-ring notebook with sections for the following: calendar, important names and phone numbers,  medications, doctors' names/phone numbers, lists/reminders, and a section to journal what you did  each day.   3.    Use a calendar to write appointments down.  4.    Write yourself a schedule for the day.  This can be placed on the calendar or in a separate section of the Memory Notebook.  Keeping a  regular schedule can help memory.  5.    Use medication organizer with sections for each day or morning/evening pills.  You may need help loading it  6.    Keep a basket, or pegboard by the door.  Place items that you need to take out with you in the basket or on the pegboard.  You may also want to  include a message board for  reminders.  7.    Use sticky notes.  Place sticky notes with reminders in a place where the task is performed.  For example: " turn off the  stove" placed by the stove, "lock the door" placed on the door at eye level, " take your medications" on  the bathroom mirror or by the place where you normally take your medications.  8.    Use alarms/timers.  Use while cooking to remind yourself to check on food or as a reminder to take your medicine, or as a  reminder to make a call, or as a reminder to perform another task, etc.   Neuropathic Pain Neuropathic pain is pain caused by damage to the nerves that are responsible for certain sensations in your body (sensory nerves). The pain can be caused by:  Damage to the sensory nerves that send signals to your spinal cord and brain (peripheral nervous system).  Damage to the sensory nerves in your brain or spinal cord (central nervous system). Neuropathic pain can make you more sensitive to pain. Even a minor sensation can feel very painful. This is usually a long-term condition that can be difficult to treat. The type of pain differs from person to person. It may:  Start suddenly (acute), or it may develop slowly and last for a long time (chronic).  Come and go as damaged nerves heal,  or it may stay at the same level for years.  Cause emotional distress, loss of sleep, and a lower quality of life. What are the causes? The most common cause of this condition is diabetes. Many other diseases and conditions can also cause neuropathic pain. Causes of neuropathic pain can be classified as:  Toxic. This is caused by medicines and chemicals. The most common cause of toxic neuropathic pain is damage from cancer treatments (chemotherapy).  Metabolic. This can be caused by: ? Diabetes. This is the most common disease that damages the nerves. ? Lack of vitamin B from long-term alcohol abuse.  Traumatic. Any injury that cuts, crushes, or stretches a nerve  can cause damage and pain. A common example is feeling pain after losing an arm or leg (phantom limb pain).  Compression-related. If a sensory nerve gets trapped or compressed for a long period of time, the blood supply to the nerve can be cut off.  Vascular. Many blood vessel diseases can cause neuropathic pain by decreasing blood supply and oxygen to nerves.  Autoimmune. This type of pain results from diseases in which the body's defense system (immune system) mistakenly attacks sensory nerves. Examples of autoimmune diseases that can cause neuropathic pain include lupus and multiple sclerosis.  Infectious. Many types of viral infections can damage sensory nerves and cause pain. Shingles infection is a common cause of this type of pain.  Inherited. Neuropathic pain can be a symptom of many diseases that are passed down through families (genetic). What increases the risk? You are more likely to develop this condition if:  You have diabetes.  You smoke.  You drink too much alcohol.  You are taking certain medicines, including medicines that kill cancer cells (chemotherapy) or that treat immune system disorders. What are the signs or symptoms? The main symptom is pain. Neuropathic pain is often described as:  Burning.  Shock-like.  Stinging.  Hot or cold.  Itching. How is this diagnosed? No single test can diagnose neuropathic pain. It is diagnosed based on:  Physical exam and your symptoms. Your health care provider will ask you about your pain. You may be asked to use a pain scale to describe how bad your pain is.  Tests. These may be done to see if you have a high sensitivity to pain and to help find the cause and location of any sensory nerve damage. They include: ? Nerve conduction studies to test how well nerve signals travel through your sensory nerves (electrodiagnostic testing). ? Stimulating your sensory nerves through electrodes on your skin and measuring the  response in your spinal cord and brain (somatosensory evoked potential).  Imaging studies, such as: ? X-rays. ? CT scan. ? MRI. How is this treated? Treatment for neuropathic pain may change over time. You may need to try different treatment options or a combination of treatments. Some options include:  Treating the underlying cause of the neuropathy, such as diabetes, kidney disease, or vitamin deficiencies.  Stopping medicines that can cause neuropathy, such as chemotherapy.  Medicine to relieve pain. Medicines may include: ? Prescription or over-the-counter pain medicine. ? Anti-seizure medicine. ? Antidepressant medicines. ? Pain-relieving patches that are applied to painful areas of skin. ? A medicine to numb the area (local anesthetic), which can be injected as a nerve block.  Transcutaneous nerve stimulation. This uses electrical currents to block painful nerve signals. The treatment is painless.  Alternative treatments, such as: ? Acupuncture. ? Meditation. ? Massage. ? Physical therapy. ? Pain  management programs. ? Counseling. Follow these instructions at home: Medicines  Take over-the-counter and prescription medicines only as told by your health care provider.  Do not drive or use heavy machinery while taking prescription pain medicine.  If you are taking prescription pain medicine, take actions to prevent or treat constipation. Your health care provider may recommend that you: ? Drink enough fluid to keep your urine pale yellow. ? Eat foods that are high in fiber, such as fresh fruits and vegetables, whole grains, and beans. ? Limit foods that are high in fat and processed sugars, such as fried or sweet foods. ? Take an over-the-counter or prescription medicine for constipation.   Lifestyle  Have a good support system at home.  Consider joining a chronic pain support group.  Do not use any products that contain nicotine or tobacco, such as cigarettes and  e-cigarettes. If you need help quitting, ask your health care provider.  Do not drink alcohol.   General instructions  Learn as much as you can about your condition.  Work closely with all your health care providers to find the treatment plan that works best for you.  Ask your health care provider what activities are safe for you.  Keep all follow-up visits as told by your health care provider. This is important. Contact a health care provider if:  Your pain treatments are not working.  You are having side effects from your medicines.  You are struggling with tiredness (fatigue), mood changes, depression, or anxiety. Summary  Neuropathic pain is pain caused by damage to the nerves that are responsible for certain sensations in your body (sensory nerves).  Neuropathic pain may come and go as damaged nerves heal, or it may stay at the same level for years.  Neuropathic pain is usually a long-term condition that can be difficult to treat. Consider joining a chronic pain support group. This information is not intended to replace advice given to you by your health care provider. Make sure you discuss any questions you have with your health care provider. Document Revised: 01/21/2019 Document Reviewed: 10/17/2017 Elsevier Patient Education  2021 [employer].

## 2021-01-18 ENCOUNTER — Encounter: Payer: Self-pay | Admitting: Psychology

## 2021-02-08 ENCOUNTER — Other Ambulatory Visit: Payer: Self-pay | Admitting: *Deleted

## 2021-02-08 MED ORDER — GABAPENTIN 300 MG PO CAPS: 900.0000 mg | ORAL_CAPSULE | Freq: Three times a day (TID) | ORAL | 1 refills | Status: DC

## 2021-02-19 ENCOUNTER — Other Ambulatory Visit: Payer: Self-pay | Admitting: Physician Assistant

## 2021-02-19 ENCOUNTER — Ambulatory Visit
Admission: RE | Admit: 2021-02-19 | Discharge: 2021-02-19 | Disposition: A | Discharge status: Home / Self Care | Payer: 59 | Source: Ambulatory Visit | Attending: Physician Assistant | Admitting: Physician Assistant

## 2021-02-19 DIAGNOSIS — R1031 Right lower quadrant pain: Secondary | ICD-10-CM

## 2021-02-20 ENCOUNTER — Other Ambulatory Visit: Payer: Self-pay | Admitting: Urology

## 2021-02-28 ENCOUNTER — Encounter (HOSPITAL_BASED_OUTPATIENT_CLINIC_OR_DEPARTMENT_OTHER): Payer: Self-pay | Admitting: Urology

## 2021-03-01 ENCOUNTER — Other Ambulatory Visit: Payer: Self-pay

## 2021-03-01 ENCOUNTER — Encounter (HOSPITAL_BASED_OUTPATIENT_CLINIC_OR_DEPARTMENT_OTHER): Payer: Self-pay | Admitting: Urology

## 2021-03-01 ENCOUNTER — Other Ambulatory Visit: Payer: Self-pay | Admitting: Physician Assistant

## 2021-03-01 DIAGNOSIS — R1031 Right lower quadrant pain: Secondary | ICD-10-CM

## 2021-03-01 NOTE — Progress Notes (Signed)
Spoke w/ via phone for pre-op interview--- Pt Lab needs dos---- Istat              Lab results------ current ekg in epic/ chart COVID test -----patient states asymptomatic no test needed Arrive at ------- 1230 on 03-05-2021 NPO after MN NO Solid Food.  Clear liquids from MN until--- 1130 Med rec completed Medications to take morning of surgery ----- Gabapentin, Flomax Diabetic medication ----- take metformin night before surgery as usual Patient instructed to bring photo id and insurance card day of surgery Patient aware to have Driver (ride ) / caregiver    for 24 hours after surgery -- wife, Adam Ford Patient Special Instructions ----- n/a Pre-Op special Istructions ----- n/a Patient verbalized understanding of instructions that were given at this phone interview. Patient denies shortness of breath, chest pain, fever, cough at this phone interview.

## 2021-03-05 ENCOUNTER — Ambulatory Visit (HOSPITAL_BASED_OUTPATIENT_CLINIC_OR_DEPARTMENT_OTHER): Payer: 59 | Admitting: Anesthesiology

## 2021-03-05 ENCOUNTER — Encounter (HOSPITAL_BASED_OUTPATIENT_CLINIC_OR_DEPARTMENT_OTHER)
Admission: RE | Disposition: A | Discharge status: Home / Self Care | Payer: Self-pay | Source: Home / Self Care | Attending: Urology

## 2021-03-05 ENCOUNTER — Encounter (HOSPITAL_BASED_OUTPATIENT_CLINIC_OR_DEPARTMENT_OTHER): Payer: Self-pay | Admitting: Urology

## 2021-03-05 ENCOUNTER — Ambulatory Visit (HOSPITAL_BASED_OUTPATIENT_CLINIC_OR_DEPARTMENT_OTHER)
Admission: RE | Admit: 2021-03-05 | Discharge: 2021-03-05 | Disposition: A | Discharge status: Home / Self Care | Payer: 59 | Attending: Urology | Admitting: Urology

## 2021-03-05 DIAGNOSIS — I10 Essential (primary) hypertension: Secondary | ICD-10-CM | POA: Insufficient documentation

## 2021-03-05 DIAGNOSIS — N400 Enlarged prostate without lower urinary tract symptoms: Secondary | ICD-10-CM | POA: Insufficient documentation

## 2021-03-05 DIAGNOSIS — X58XXXA Exposure to other specified factors, initial encounter: Secondary | ICD-10-CM | POA: Insufficient documentation

## 2021-03-05 DIAGNOSIS — N201 Calculus of ureter: Secondary | ICD-10-CM | POA: Diagnosis not present

## 2021-03-05 DIAGNOSIS — N135 Crossing vessel and stricture of ureter without hydronephrosis: Secondary | ICD-10-CM | POA: Insufficient documentation

## 2021-03-05 DIAGNOSIS — Q625 Duplication of ureter: Secondary | ICD-10-CM | POA: Insufficient documentation

## 2021-03-05 DIAGNOSIS — E119 Type 2 diabetes mellitus without complications: Secondary | ICD-10-CM | POA: Diagnosis not present

## 2021-03-05 DIAGNOSIS — Z6838 Body mass index (BMI) 38.0-38.9, adult: Secondary | ICD-10-CM | POA: Diagnosis not present

## 2021-03-05 DIAGNOSIS — S3710XA Unspecified injury of ureter, initial encounter: Secondary | ICD-10-CM | POA: Insufficient documentation

## 2021-03-05 DIAGNOSIS — N35919 Unspecified urethral stricture, male, unspecified site: Secondary | ICD-10-CM | POA: Insufficient documentation

## 2021-03-05 DIAGNOSIS — Z87442 Personal history of urinary calculi: Secondary | ICD-10-CM | POA: Insufficient documentation

## 2021-03-05 DIAGNOSIS — E669 Obesity, unspecified: Secondary | ICD-10-CM | POA: Insufficient documentation

## 2021-03-05 DIAGNOSIS — N200 Calculus of kidney: Secondary | ICD-10-CM

## 2021-03-05 HISTORY — DX: Trigeminal neuralgia: G50.0

## 2021-03-05 HISTORY — DX: Calculus of kidney: N20.0

## 2021-03-05 HISTORY — DX: Presence of spectacles and contact lenses: Z97.3

## 2021-03-05 HISTORY — DX: Unspecified blepharitis right eye, unspecified eyelid: H01.006

## 2021-03-05 HISTORY — DX: Obstructive sleep apnea (adult) (pediatric): Z99.89

## 2021-03-05 HISTORY — DX: Headache, unspecified: R51.9

## 2021-03-05 HISTORY — PX: CYSTOSCOPY/URETEROSCOPY/HOLMIUM LASER/STENT PLACEMENT: SHX6546

## 2021-03-05 HISTORY — DX: Type 2 diabetes mellitus without complications: E11.9

## 2021-03-05 HISTORY — DX: Personal history of urinary calculi: Z87.442

## 2021-03-05 HISTORY — DX: Obstructive sleep apnea (adult) (pediatric): G47.33

## 2021-03-05 HISTORY — DX: Other specified postprocedural states: Z98.890

## 2021-03-05 HISTORY — DX: Unspecified blepharitis right eye, unspecified eyelid: H01.003

## 2021-03-05 HISTORY — DX: Unspecified glaucoma: H40.9

## 2021-03-05 HISTORY — DX: Personal history of other malignant neoplasm of skin: Z85.828

## 2021-03-05 LAB — POCT I-STAT, CHEM 8
BUN: 21 mg/dL — ABNORMAL HIGH (ref 6–20)
Calcium, Ion: 1.22 mmol/L (ref 1.15–1.40)
Chloride: 103 mmol/L (ref 98–111)
Creatinine, Ser: 1.1 mg/dL (ref 0.61–1.24)
Glucose, Bld: 114 mg/dL — ABNORMAL HIGH (ref 70–99)
HCT: 46 % (ref 39.0–52.0)
Hemoglobin: 15.6 g/dL (ref 13.0–17.0)
Potassium: 3.7 mmol/L (ref 3.5–5.1)
Sodium: 143 mmol/L (ref 135–145)
TCO2: 27 mmol/L (ref 22–32)

## 2021-03-05 LAB — GLUCOSE, CAPILLARY: Glucose-Capillary: 136 mg/dL — ABNORMAL HIGH (ref 70–99)

## 2021-03-05 SURGERY — CYSTOSCOPY/URETEROSCOPY/HOLMIUM LASER/STENT PLACEMENT
Anesthesia: General | Site: Urethra | Laterality: Right

## 2021-03-05 MED ORDER — GLYCOPYRROLATE 0.2 MG/ML IJ SOLN
INTRAMUSCULAR | Status: DC | PRN
  Administered 2021-03-05 (×2): .1 mg via INTRAVENOUS

## 2021-03-05 MED ORDER — GENTAMICIN SULFATE 40 MG/ML IJ SOLN
5.0000 mg/kg | Freq: Once | INTRAVENOUS | Status: AC
  Administered 2021-03-05: 460 mg via INTRAVENOUS
  Filled 2021-03-05: qty 11.5

## 2021-03-05 MED ORDER — IOHEXOL 300 MG/ML  SOLN
INTRAMUSCULAR | Status: DC | PRN
  Administered 2021-03-05 (×2): 10 mL via URETHRAL

## 2021-03-05 MED ORDER — ONDANSETRON HCL 4 MG/2ML IJ SOLN
INTRAMUSCULAR | Status: DC | PRN
  Administered 2021-03-05: 4 mg via INTRAVENOUS

## 2021-03-05 MED ORDER — OXYCODONE HCL 5 MG PO TABS
5.0000 mg | ORAL_TABLET | Freq: Once | ORAL | Status: AC | PRN
  Administered 2021-03-05: 5 mg via ORAL

## 2021-03-05 MED ORDER — ONDANSETRON HCL 4 MG/2ML IJ SOLN: 4.0000 mg | Freq: Once | INTRAMUSCULAR | Status: DC | PRN

## 2021-03-05 MED ORDER — MIDAZOLAM HCL 2 MG/2ML IJ SOLN
INTRAMUSCULAR | Status: DC | PRN
  Administered 2021-03-05: 1 mg via INTRAVENOUS

## 2021-03-05 MED ORDER — OXYCODONE-ACETAMINOPHEN 5-325 MG PO TABS: 1.0000 | ORAL_TABLET | ORAL | 0 refills | Status: AC | PRN

## 2021-03-05 MED ORDER — DEXAMETHASONE SODIUM PHOSPHATE 4 MG/ML IJ SOLN
INTRAMUSCULAR | Status: DC | PRN
  Administered 2021-03-05: 8 mg via INTRAVENOUS

## 2021-03-05 MED ORDER — OXYCODONE HCL 5 MG/5ML PO SOLN: 5.0000 mg | Freq: Once | ORAL | Status: AC | PRN

## 2021-03-05 MED ORDER — OXYBUTYNIN CHLORIDE ER 10 MG PO TB24: 10.0000 mg | ORAL_TABLET | Freq: Every day | ORAL | 0 refills | Status: DC

## 2021-03-05 MED ORDER — DOCUSATE SODIUM 100 MG PO CAPS: 100.0000 mg | ORAL_CAPSULE | Freq: Every day | ORAL | 0 refills | Status: DC | PRN

## 2021-03-05 MED ORDER — LACTATED RINGERS IV SOLN: INTRAVENOUS | Status: DC

## 2021-03-05 MED ORDER — OXYCODONE HCL 5 MG PO TABS
ORAL_TABLET | ORAL | Status: AC
  Filled 2021-03-05: qty 1

## 2021-03-05 MED ORDER — LIDOCAINE HCL (CARDIAC) PF 100 MG/5ML IV SOSY
PREFILLED_SYRINGE | INTRAVENOUS | Status: DC | PRN
  Administered 2021-03-05: 100 mg via INTRAVENOUS

## 2021-03-05 MED ORDER — TAMSULOSIN HCL 0.4 MG PO CAPS: 0.4000 mg | ORAL_CAPSULE | Freq: Every day | ORAL | 1 refills | Status: AC

## 2021-03-05 MED ORDER — FENTANYL CITRATE (PF) 100 MCG/2ML IJ SOLN
INTRAMUSCULAR | Status: DC | PRN
  Administered 2021-03-05: 75 ug via INTRAVENOUS
  Administered 2021-03-05: 25 ug via INTRAVENOUS

## 2021-03-05 MED ORDER — CEPHALEXIN 500 MG PO CAPS: 500.0000 mg | ORAL_CAPSULE | Freq: Two times a day (BID) | ORAL | 0 refills | Status: AC

## 2021-03-05 MED ORDER — HYDROMORPHONE HCL 1 MG/ML IJ SOLN
INTRAMUSCULAR | Status: AC
  Filled 2021-03-05: qty 1

## 2021-03-05 MED ORDER — PROPOFOL 10 MG/ML IV BOLUS
INTRAVENOUS | Status: DC | PRN
  Administered 2021-03-05: 200 mg via INTRAVENOUS

## 2021-03-05 MED ORDER — HYDROMORPHONE HCL 1 MG/ML IJ SOLN
0.2500 mg | INTRAMUSCULAR | Status: DC | PRN
  Administered 2021-03-05 (×2): 0.5 mg via INTRAVENOUS

## 2021-03-05 MED ORDER — ACETAMINOPHEN 500 MG PO TABS
1000.0000 mg | ORAL_TABLET | Freq: Once | ORAL | Status: AC
  Administered 2021-03-05: 1000 mg via ORAL

## 2021-03-05 MED ORDER — ACETAMINOPHEN 500 MG PO TABS
ORAL_TABLET | ORAL | Status: AC
  Filled 2021-03-05: qty 2

## 2021-03-05 SURGICAL SUPPLY — 23 items
BAG DRAIN URO-CYSTO SKYTR STRL (DRAIN) ×2 IMPLANT
BAG DRN UROCATH (DRAIN) ×1
CATH URET 5FR 28IN OPEN ENDED (CATHETERS) ×2 IMPLANT
CATH URET DUAL LUMEN 6-10FR 50 (CATHETERS) ×2 IMPLANT
CLOTH BEACON ORANGE TIMEOUT ST (SAFETY) ×2 IMPLANT
Flexor Ureteral Access sheath 45 cm ×2 IMPLANT
GLOVE SURG ENC MOIS LTX SZ7 (GLOVE) ×2 IMPLANT
GLOVE SURG UNDER POLY LF SZ7 (GLOVE) ×2 IMPLANT
GOWN STRL REUS W/TWL LRG LVL3 (GOWN DISPOSABLE) ×2 IMPLANT
GUIDEWIRE STR DUAL SENSOR (WIRE) ×4 IMPLANT
GUIDEWIRE ZIPWRE .038 STRAIGHT (WIRE) ×2 IMPLANT
IV NS 1000ML (IV SOLUTION) ×2
IV NS 1000ML BAXH (IV SOLUTION) ×1 IMPLANT
IV NS IRRIG 3000ML ARTHROMATIC (IV SOLUTION) ×2 IMPLANT
KIT TURNOVER CYSTO (KITS) ×2 IMPLANT
MANIFOLD NEPTUNE II (INSTRUMENTS) ×2 IMPLANT
NS IRRIG 500ML POUR BTL (IV SOLUTION) ×2 IMPLANT
PACK CYSTO (CUSTOM PROCEDURE TRAY) ×2 IMPLANT
SHEATH URETERAL 12FRX35CM (MISCELLANEOUS) ×2 IMPLANT
STENT URET 6FRX26 CONTOUR (STENTS) ×2 IMPLANT
SYR 10ML LL (SYRINGE) ×2 IMPLANT
TUBE CONNECTING 12X1/4 (SUCTIONS) ×2 IMPLANT
TUBE FEEDING 8FR 16IN STR KANG (MISCELLANEOUS) ×2 IMPLANT

## 2021-03-05 NOTE — Progress Notes (Deleted)
Spoke with pam at [facility] to request preop cardiac clearance for surgery today, pt is overdue for echo per dr  Kathyrn Drown 02-28-2021, per dr Elgie Congo anesthesia

## 2021-03-05 NOTE — Anesthesia Procedure Notes (Signed)
Procedure Name: LMA Insertion Date/Time: 03/05/2021 3:07 PM Performed by: Georgeanne Nim, CRNA Pre-anesthesia Checklist: Patient identified, Emergency Drugs available, Suction available, Patient being monitored and Timeout performed Patient Re-evaluated:Patient Re-evaluated prior to induction Oxygen Delivery Method: Circle system utilized Preoxygenation: Pre-oxygenation with 100% oxygen Induction Type: IV induction Ventilation: Mask ventilation without difficulty LMA: LMA inserted LMA Size: 5.0 Number of attempts: 1 Placement Confirmation: positive ETCO2,  CO2 detector and breath sounds checked- equal and bilateral Tube secured with: Tape Dental Injury: Teeth and Oropharynx as per pre-operative assessment

## 2021-03-05 NOTE — Discharge Instructions (Signed)

## 2021-03-05 NOTE — Anesthesia Preprocedure Evaluation (Addendum)
Anesthesia Evaluation  Patient identified by MRN, date of birth, ID band Patient awake    Reviewed: Allergy & Precautions, NPO status , Patient's Chart, lab work & pertinent test results, reviewed documented beta blocker date and time   Airway Mallampati: III  TM Distance: >3 FB Neck ROM: Full    Dental no notable dental hx. (+) Teeth Intact, Dental Advisory Given   Pulmonary sleep apnea and Continuous Positive Airway Pressure Ventilation ,    Pulmonary exam normal breath sounds clear to auscultation       Cardiovascular hypertension (156/78 in preop, states he normally runs in the SBPs 140s), Pt. on medications and Pt. on home beta blockers Normal cardiovascular exam Rhythm:Regular Rate:Normal     Neuro/Psych  Headaches, Supraorbital neuralgia- gabapentin negative psych ROS   GI/Hepatic negative GI ROS, Neg liver ROS,   Endo/Other  diabetes, Well Controlled, Type 2, Oral Hypoglycemic AgentsObesity BMI 39 a1c 6.7  Renal/GU Renal disease (right renal stones)  negative genitourinary   Musculoskeletal negative musculoskeletal ROS (+)   Abdominal (+) + obese,   Peds  Hematology negative hematology ROS (+)   Anesthesia Other Findings   Reproductive/Obstetrics negative OB ROS                           Anesthesia Physical Anesthesia Plan  ASA: III  Anesthesia Plan: General   Post-op Pain Management:    Induction: Intravenous  PONV Risk Score and Plan: 3 and Ondansetron, Dexamethasone, Midazolam and Treatment may vary due to age or medical condition  Airway Management Planned: LMA  Additional Equipment: None  Intra-op Plan:   Post-operative Plan: Extubation in OR  Informed Consent: I have reviewed the patients History and Physical, chart, labs and discussed the procedure including the risks, benefits and alternatives for the proposed anesthesia with the patient or authorized  representative who has indicated his/her understanding and acceptance.     Dental advisory given  Plan Discussed with: CRNA  Anesthesia Plan Comments:        Anesthesia Quick Evaluation

## 2021-03-05 NOTE — Anesthesia Postprocedure Evaluation (Signed)
Anesthesia Post Note  Patient: Adam Ford  Procedure(s) Performed: CYSTOSCOPY/RETROGRADE/URETEROSCOPY/STENT PLACEMENT (Right Urethra)     Patient location during evaluation: PACU Anesthesia Type: General Level of consciousness: awake and alert Pain management: pain level controlled Vital Signs Assessment: post-procedure vital signs reviewed and stable Respiratory status: spontaneous breathing, nonlabored ventilation, respiratory function stable and patient connected to nasal cannula oxygen Cardiovascular status: blood pressure returned to baseline and stable Postop Assessment: no apparent nausea or vomiting Anesthetic complications: no   No complications documented.  Last Vitals:  Vitals:   03/05/21 1704 03/05/21 1708  BP:    Pulse: 71 73  Resp: (!) 22 15  Temp: 36.6 C   SpO2: 100% 98%    Last Pain:  Vitals:   03/05/21 1258  TempSrc: Oral  PainSc: 1                  Taimi Towe P Keeanna Villafranca

## 2021-03-05 NOTE — H&P (Signed)
Adam Ford  MRN: [id number]  DOB: June 25, 1961, 60 year old Male  SSN:    PRIMARY CARE:  Donald Prose  REFERRING:    PROVIDER:  Rexene Alberts, M.D.  LOCATION:  [facility] [phone]     --------------------------------------------------------------------------------   CC/HPI: Adam Ford is a 60 year old male seen in consultation today for history of bilateral nephrolithiasis.   He has had a long history of urolithiasis. He has passed stones in the past, has had ESWL and ureteroscopy. He denies history of PCNL. Most recent CT A/P 12/16/2020 demonstrated bilateral nonobstructing renal calculi. My read: Left lower pole: 1 cm x 5 mm stone. Right lower pole: 8 mm x 8 mm, 1.2 cm x 1.4 cm and a right lower pole calyx that is slightly more superior. 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 Both of the stones on the right are in the lower pole moiety.   He does complain of intermittent right flank pain and wishes to have the stones treated.   Of note, he was recently admitted and discharged on 12/20/2020 with sepsis secondary to E. coli urinary tract infection. Urine culture resulted pansensitive E. coli. He received a 3-day course of Rocephin and discharged home on a course of Keflex for 7 days. CT did not show any evidence of pyelonephritis.   He does complain of some lower urinary tract symptoms. IPSS score is 6, quality of life 3. He does complain of frequency, intermittency, urgency and 2 time nocturia.   He does have past medical history of diabetes, hypertension, obesity.     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 His stone burden on the right is in the lower moiety. He has bilateral non-obstructing stones. The 2 largest stones are identified in the mid pole and lower pole area of the right kidney. The mid pole stone measures approximately 12 mm, lower pole calculi measures between 7 and 8 mm respectively. Largest stone on the left identified in the left lower pole measures between 4 and 5 mm. These are grossly stable when compared to KUB imaging performed last year. - 01/05/2021, Bilateral, He has bilateral renal calculi. We discussed proceeding with metabolic evaluation however he was inclined not to proceed in that fashion but would rather continue to monitor his stones since he has not had a stone episode in 7 years. I will therefore have him return in 6 months for a KUB to assure stability., - 07/14/2019 Microscopic hematuria - 02/03/2020 Abnormal radiologic findings on diagnositic imaging of other urinary organs, What is being seen on his CT scan appears to be his median lobe. I found no abnormality whatsoever on his rectal exam. I will obtain a screening PSA. I have reassured him I feel that this is of no clinical significance. - 07/14/2019 BPH w/o LUTS, He does have some prostate enlargement. It is noted on his CT scan and on examination but does not have any significant voiding symptoms. - 07/14/2019    NON-GU PMH: Anxiety Diabetes Type  2 Hypercholesterolemia Hypertension Sleep Apnea    FAMILY HISTORY: Colon Cancer - Grandmother   SOCIAL HISTORY: Marital Status: Married Preferred Language: English; Ethnicity: Not Hispanic Or Latino; Race: White Current Smoking Status: Patient has never smoked.   Tobacco Use Assessment Completed: Used Tobacco in last 30 days? Does not drink anymore.  Drinks 4+ caffeinated drinks per day.    REVIEW OF SYSTEMS:    GU Review Male:   Patient  denies frequent urination, hard to postpone urination, burning/ pain with urination, get up at night to urinate, leakage of urine, stream starts and stops, trouble starting your stream, have to strain to urinate , erection problems, and penile pain.  Gastrointestinal (Upper):   Patient denies indigestion/ heartburn, vomiting, and nausea.  Gastrointestinal (Lower):   Patient denies diarrhea and constipation.  Constitutional:   Patient denies fever, night sweats, weight loss, and fatigue.  Skin:   Patient denies skin rash/ lesion and itching.  Eyes:   Patient denies blurred vision and double vision.  Ears/ Nose/ Throat:   Patient denies sore throat and sinus problems.  Hematologic/Lymphatic:   Patient denies swollen glands and easy bruising.  Cardiovascular:   Patient denies leg swelling and chest pains.  Respiratory:   Patient denies cough and shortness of breath.  Endocrine:   Patient denies excessive thirst.  Musculoskeletal:   Patient denies back pain and joint pain.  Neurological:   Patient denies headaches and dizziness.  Psychologic:   Patient denies depression and anxiety.   VITAL SIGNS:      01/25/2021 01:33 PM  Weight 210 lb / 95.25 kg  Height 70 in / 177.8 cm  BP 145/77 mmHg  Pulse 76 /min  Temperature 97.7 F / 36.5 C  BMI 30.1 kg/m   MULTI-SYSTEM PHYSICAL EXAMINATION:    Constitutional: Well-nourished. No physical deformities. Normally developed. Good grooming.  Respiratory: No labored breathing, no use of accessory muscles.   Cardiovascular: Normal temperature, normal extremity pulses, no swelling, no varicosities.  Gastrointestinal: No mass, no tenderness, no rigidity, morbidly obese     Complexity of Data:  Source Of History:  Patient, Medical Record Summary  Records Review:   AUA Symptom Score  Urine Test Review:   Urinalysis  X-Ray Review: C.T. Abdomen/Pelvis: Reviewed Films. Reviewed Report. Discussed With Patient.     07/14/19  PSA  Total PSA 1.19 ng/mL    Notes:                     CLINICAL DATA: RIGHT lower quadrant pain with chills. Fever   EXAM:  CT ABDOMEN AND PELVIS WITH CONTRAST   TECHNIQUE:  Multidetector CT imaging of the abdomen and pelvis was performed  using the standard protocol following bolus administration of  intravenous contrast.   CONTRAST: 137mL OMNIPAQUE IOHEXOL 300 MG/ML SOLN   COMPARISON: CT 07/11/2019   FINDINGS:  Lower chest: Lung bases are clear.   Hepatobiliary: No focal hepatic lesion. Postcholecystectomy. No  biliary dilatation.   Pancreas: Pancreas is normal. No ductal dilatation. No pancreatic  inflammation.   Spleen: Normal spleen   Adrenals/urinary tract: Adrenal glands normal. Bilateral  nonobstructing renal calculi. No ureterolithiasis or obstructive  uropathy. No bladder calculi.   Stomach/Bowel: Post gastric sleeve bariatric surgery. Duodenum and  small-bowel normal. Ascending and transverse colon normal. No bowel  obstruction. Rectosigmoid colon normal.   Vascular/Lymphatic: Abdominal aorta is normal caliber with  atherosclerotic calcification. There is no retroperitoneal or  periportal lymphadenopathy. No pelvic lymphadenopathy.   Reproductive:  Lesion prostate gland is enlarged measuring 6.8 by 5.7  by 5.1 cm (volume = 100 cm^3)   Other: No free fluid.   Musculoskeletal: No aggressive osseous lesion.   IMPRESSION:  1. No acute findings in the abdomen pelvis.  2. Bilateral nonobstructing renal calculi.  3. No bowel obstruction or bowel inflammation.  4. Prostatomegaly.  5. Aortic Atherosclerosis (ICD10-I70.0).    Electronically Signed  By: Suzy Bouchard M.D.  On: 12/16/2020 19:55   PROCEDURES:          Urinalysis w/Scope Dipstick Dipstick Cont'd Micro  Color: Yellow Bilirubin: Neg mg/dL WBC/hpf: 0 - 5/hpf  Appearance: Clear Ketones: Neg mg/dL RBC/hpf: 0 - 2/hpf  Specific Gravity: 1.025 Blood: Neg ery/uL Bacteria: NS (Not Seen)  pH: <=5.0 Protein: Neg mg/dL  Cystals: NS (Not Seen)  Glucose: Neg mg/dL Urobilinogen: 0.2 mg/dL Casts: NS (Not Seen)    Nitrites: Neg Trichomonas: Not Present    Leukocyte Esterase: Trace leu/uL Mucous: Not Present      Epithelial Cells: NS (Not Seen)      Yeast: NS (Not Seen)      Sperm: Not Present    ASSESSMENT:      ICD-10 Details  1 GU:   Renal calculus - N20.0 Bilateral  2   BPH w/LUTS - N40.1   3   Urinary Frequency - R35.0   4   Weak Urinary Stream - R39.12    PLAN:           Orders Labs CULTURE, URINE          Schedule Return Visit/Planned Activity: 1 Month - Office Visit             Note: Preop          Document Letter(s):  Created for Patient: Clinical Summary         Notes:   1. Bilateral renal stones:  -CT A/P 12/16/2020 demonstrated bilateral nonobstructing renal calculi. My read: Left lower pole: 1 cm x 5 mm stone. Right lower pole: 8 mm x 8 mm, 1.2 cm x 1.4 cm and a right lower pole calyx that is slightly more superior. 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 Both of the stones on the right are in the lower pole moiety.  -We discussed options including surveillance, ESWL, ureteroscopy, PCNL. We discussed risk and benefits of each. We did discuss that he is obese and has a large skin the skin distance approximately 15 cm. He also has a complicating factor of a duplicated right collecting system. I thought that PCNL may pose more risk and that we may not have a long life instrument to reach his stone via PCNL approach.  -He elects to proceed with cystoscopy, right retrograde pyelogram, right ureteroscopy with laser lithotripsy and best extraction of stones, right ureteral stent placement. He understands that this may be a staged procedure.  -We will obtain urine for culture. This seems to be negative prior to scheduling surgery. He does have a history of recent urinary tract infection. We will plan for gentamicin preoperatively.  -He understands that we may have to treat  his left-sided stones at a later date.   2. BPH with LUTS: Mild symptoms. He does not wish to start medicine at this time.   CC: Donald Prose, MD    Signed by Rexene Alberts, M.D. on 01/25/21 at 5:25 PM (EDT)  Urology Preoperative H&P   Chief Complaint: Right renal stones  History of Present Illness:  Adam Ford is a 60 y.o. male with right renal stones here for cystoscopy, R RPG, R URS/LL, R stent placement. He denies fevers, chills, dysuria.    Past Medical History:  Diagnosis Date  . Blepharitis, both eyes   . Bulging of thoracic intervertebral disc    T8  . Glaucoma, both eyes    per pt no eye drops , resolved post laser 2019  . Headache    with neuralgia  . History of basal cell carcinoma (BCC) excision    s/p moh's surgery of nose in 2020  . History of kidney stones   . History of sepsis 12/16/2020   hospital admission in epic, secondary to UTI with ecoli and pyelonephritis  . Hypercholesteremia   . Hypertension    followed by pcp  . OSA on CPAP   . Renal calculi    bilateral non-obstructive per abd imaging 02-19-2021  . Supraorbital neuralgia    neurologist--- dr Jaynee Eagles----  mostly right side, takes gabapentin  . Type 2 diabetes mellitus ([place])    followed by pcp  ---  (03-01-2021 pt stated checks blood sugar once wkly one hour after eats,  average 130--140)  . Wears glasses     Past Surgical History:  Procedure Laterality Date  . APPENDECTOMY  1969  . CYSTOSCOPY/URETEROSCOPY/HOLMIUM LASER/STENT PLACEMENT  last one 2012 approx.  Marland Kitchen EXTRACORPOREAL SHOCK WAVE LITHOTRIPSY  last one 2012  approx.  Marland Kitchen GASTRIC SLEEVE  2017 approx  . LAPAROSCOPIC CHOLECYSTECTOMY  2008 approx  . lasers yag iridotomy Bilateral   . MOHS SURGERY  2020   nose  . NASAL TURBINATE REDUCTION  2018  . NASAL VALVE COLLAPSE REPAIR  2019  . TONSILLECTOMY  child  . TRABECULECTOMY Bilateral 2019   w/  yag laser    Allergies:  Allergies  Allergen Reactions  . Bee Venom Anaphylaxis     Family History  Problem Relation Age of Onset  . Hypertension Mother   . Hypertension Father   . Aneurysm Father   . Colon cancer Maternal Grandmother   . Stroke Paternal Grandfather   . Stroke Other   . High Cholesterol Other        on both sides  . Other Neg Hx     Social History:  reports that he has never smoked. He has never used smokeless tobacco. He reports that he does not drink alcohol and does not use drugs.  ROS: A complete review of systems was performed.  All systems are negative except for pertinent findings as noted.  Physical Exam:  Vital signs in last 24 hours: Temp:  [98.6 F (37 C)] 98.6 F (37 C) (05/23 1258) Pulse Rate:  [61] 61 (05/23 1258) Resp:  [17] 17 (05/23 1258) BP: (156)/(87) 156/87 (05/23 1258) SpO2:  [98 %] 98 % (05/23 1258) Weight:  [121.2 kg] 121.2 kg (05/23 1258) Constitutional:  Alert and oriented, No acute distress Cardiovascular: Regular rate and rhythm Respiratory: Normal respiratory effort, Lungs clear bilaterally GI: Abdomen is soft, nontender, nondistended, no abdominal masses GU: No CVA tenderness Lymphatic: No lymphadenopathy Neurologic: Grossly intact, no focal deficits Psychiatric: Normal mood and affect  Laboratory Data:  Recent Labs    03/05/21 1313  HGB 15.6  HCT 46.0    Recent Labs    03/05/21 1313  NA 143  K 3.7  CL 103  GLUCOSE 114*  BUN 21*  CREATININE 1.10     Results for orders placed or performed during the hospital encounter of 03/05/21 (  from the past 24 hour(s))  I-STAT, chem 8     Status: Abnormal   Collection Time: 03/05/21  1:13 PM  Result Value Ref Range   Sodium 143 135 - 145 mmol/L   Potassium 3.7 3.5 - 5.1 mmol/L   Chloride 103 98 - 111 mmol/L   BUN 21 (H) 6 - 20 mg/dL   Creatinine, Ser 1.10 0.61 - 1.24 mg/dL   Glucose, Bld 114 (H) 70 - 99 mg/dL   Calcium, Ion 1.22 1.15 - 1.40 mmol/L   TCO2 27 22 - 32 mmol/L   Hemoglobin 15.6 13.0 - 17.0 g/dL   HCT 46.0 39.0 - 52.0 %   No  results found for this or any previous visit (from the past 240 hour(s)).  Renal Function: Recent Labs    03/05/21 1313  CREATININE 1.10   Estimated Creatinine Clearance: 96.7 mL/min (by C-G formula based on SCr of 1.1 mg/dL).  Radiologic Imaging: No results found.  I independently reviewed the above imaging studies.  Assessment and Plan Adam Ford is a 60 y.o. male with right renal stones here for cystoscopy, R RPG, R URS/LL, R stent placement. He denies fevers, chills, dysuria.  -The risks, benefits and alternatives of cystoscopy with right URS/LL, R RPG, R JJ stent placement was discussed with the patient.  Risks include, but are not limited to: bleeding, urinary tract infection, ureteral injury, ureteral stricture disease, chronic pain, urinary symptoms, bladder injury, stent migration, the need for nephrostomy tube placement, MI, CVA, DVT, PE and the inherent risks with general anesthesia.  The patient voices understanding and wishes to proceed.    Matt R. Broden Holt MD 03/05/2021, 2:54 PM  [facility] Pager: [phone]

## 2021-03-05 NOTE — Op Note (Signed)
Operative Note  Preoperative diagnosis:  1.  Right stone  Postoperative diagnosis: 1.  Right stone  Procedure(s): 1.  Cystoscopy 2. Right ureteroscopy diagnostic 3. Right retrograde pyelogram 4. Right ureteral stent placement 5. Fluoroscopy with intraoperative interpretation  Surgeon: Rexene Alberts, MD  Assistants:  None  Anesthesia:  General  Complications:  None  EBL:  Minimal  Specimens: 1. None  Drains/Catheters: 1.  6Fr x 26cm ureteral stent without tether string in the right lower pole moiety  Intraoperative findings:   1. Cystoscopy demonstrated mild fossa navicularis stricture, moderate benign prostatic hypertrophy.  There were no suspicious bladder lesions.  His bilateral ureteral orifices were in their NORMAL orthotopic position. 2. Initial right retrograde pyelogram demonstrated, and sheath ureter into the bladder with very distal separation at the upper and lower pole moieties.  There is slightly narrowed caliber proximal ureter at the right lower pole moiety.  There is no hydronephrosis in either the upper pole or lower pole moiety. 3. Right diagnostic ureteroscopy of the upper pole moiety demonstrated no suspicious stones within the right ureter.  There were no calcifications seen on the preoperative scan in the right upper pole moiety collecting system. 4. Right diagnostic ureteroscopy in the right lower pole moiety demonstrated slightly narrowed caliber ureter with annular rings both distally and proximally towards the level of the UPJ.  Towards low of the UPJ, I was able to pass a wire however I was unable to pass a ureteroscope alongside this wire both prior to and after ureteral cath access sheath placement.  There is a slight grade 2 ureteral injury in the right proximal ureter of the right lower pole moiety approximately 3 cm with no evidence of fat and no extravasation of contrast. 5. Unable to gain access into the right kidney via stenotic right lower pole  moiety proximal ureter  6. Successful placement of a 6 French by 26 cm ureteral stent into the right lower pole moiety.  Indication:  Adam Ford is a 60 y.o. male with a long history of bilateral urolithiasis.  He has had multiple episodes of medical expulsive therapy, ESWL and ureteroscopy.  He has had no prior PCNL. recent CT A/P 12/16/2020 demonstrated bilateral nonobstructing renal calculi. My read: Left lower pole: 1 cm x 5 mm stone. Right lower pole: 8 mm x 8 mm, 1.2 cm x 1.4 cm and a right lower pole calyx that is slightly more superior. 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 Both of the stones on the right are in the lower pole moiety.  He was brought to the operating today for anticipated right ureteroscopy with laser lithotripsy and basket extraction of right lower pole stones.  After thorough discussion including all relevant risk benefits alternatives, he presents the operating today for this procedure.  Description of procedure: After informed consent was obtained from the patient, the patient was identified and taken to the operating room and placed in the supine position.  General anesthesia was administered as well as perioperative IV antibiotics.  At the beginning of the case, a time-out was performed to properly identify the patient, the surgery to be performed, and the surgical site.  Sequential compression devices were applied to the lower extremities at the beginning of the case for DVT prophylaxis.  The patient was then placed in the dorsal lithotomy supine position, prepped and draped in sterile fashion.  Preliminary scout fluoroscopy revealed that there was approximately 2 separate 1 cm calcifications within the right  lower pole kidney, which corresponds to the stones found on the preoperative CT scan. We then passed the 21-French rigid cystoscope through the urethra and into the bladder under vision without any difficulty, noting a slightly narrowed  fossa navicularis and mildly obstructing prostate.  A systematic evaluation of the bladder revealed no evidence of any suspicious bladder lesions.  Ureteral orifices were in normal position.    Under cystoscopic and flouroscopic guidance, we cannulated the right ureteral orifice with a 5-French open-ended ureteral catheter and a gentle retrograde pyelogram was performed, demonstrating a common ureteral sheath with early bifurcation into an upper and lower pole moieties.  The lower pole moiety had slightly small caliber both distally and proximally.  There is no hydroureteronephrosis of both the right upper pole or right lower pole moieties. A 0.038 sensor wire was then passed up to the level of the renal pelvis of the upper pole moiety and secured to the drape as a safety wire. The ureteral catheter and cystoscope were removed, leaving the safety wire in place.   A semi-rigid ureteroscope was passed alongside the wire up the distal ureter.  I then advanced a separate 0.038 sensor wire into the right lower pole moiety.  Of note, the right lower pole moiety distal ureter did appear to be slightly stenotic.  I was able to pass a separate wire into this lower pole moiety up to the level of the collecting system.  I passed a semirigid ureteroscope up both the lower pole and upper pole moiety ureters as proximally as the scope would allow me into the mid ureter.  There was some slight narrowing with wide annular caliber rings in the lower pole moiety.   I then passed a flexible ureteroscope over a wire into the lower pole moiety and advanced this into the right lower pole moiety proximal ureter.  I then remove the wire.  I try to navigate the scope alongside the safety wire however it would not go beyond the right proximal ureter given prior scarring and slight stricturing at this area.  I then passed a dual-lumen catheter over the wire, performed a repeat retrograde pyelogram demonstrating no hydroureteronephrosis  with slight narrowing at the right proximal ureter.  I then placed a separate 0.038 sensor wire into this lower pole collecting system.  I then passed a 12/14Fr ureteral access sheath was carefully advanced up the ureter.  I felt some slight resistance at the right proximal ureter over this lower pole moiety so I stopped advancing.  I then inserted my digital flexible ureteroscope and again, could not bypass this proximal area of stenosis.  I did withdraw the sheath slightly and noticed a 3 cm grade 2 injury anteriorly that only appeared to be less than 15% of the lumen.  Given this, I elected not to proceed with further dilatation of the ureter.  I elected to leave the stents.  I then withdrew the ureteroscope and noted no further traumatic areas of the ureter.  There was no bleeding.  I performed a repeat retrograde pyelogram demonstrating no extravasation of contrast with no hydroureteronephrosis.    Once the ureteroscope was removed, the Glidewire was backloaded through the rigid cystoscope, which was then advanced down the urethra and into the bladder. We then used the Glidewire under direct vision through the rigid cystoscope and under fluoroscopic guidance and passed up a 6-French, 26 cm double-pigtail ureteral stent up the right lower pole moiety ureter, making sure that the proximal and distal ends coiled  within the kidney and bladder respectively.  I did not leave a tether string. The bladder was then emptied with irrigation solution.  The cystoscope was then removed.    The patient tolerated the procedure well and there was no complication. Patient was awoken from anesthesia and taken to the recovery room in stable condition. I was present and scrubbed for the entirety of the case.  Plan:  Patient will be discharged home.  Follow up with me in 7 to 10 days to discuss neck steps.  We will plan on leaving the right ureteral stent in place for approximately 1 month given the slight injury grade 2 to  the right proximal ureter of the right lower pole moiety.  I will then discuss other options including repeat ureteroscopy with potential balloon dilation, ESWL or PCNL.  These findings were discussed in depth with his wife.  Matt R. [facility]  Pager: [phone]

## 2021-03-05 NOTE — Transfer of Care (Signed)
Immediate Anesthesia Transfer of Care Note  Patient: Mali Penner  Procedure(s) Performed: CYSTOSCOPY/RETROGRADE/URETEROSCOPY/STENT PLACEMENT (Right Urethra)  Patient Location: PACU  Anesthesia Type:General  Level of Consciousness: awake, alert , oriented and patient cooperative  Airway & Oxygen Therapy: Patient Spontanous Breathing and Patient connected to nasal cannula oxygen  Post-op Assessment: Report given to RN and Post -op Vital signs reviewed and stable  Post vital signs: Reviewed and stable  Last Vitals:  Vitals Value Taken Time  BP    Temp    Pulse 71 03/05/21 1607  Resp    SpO2 94 % 03/05/21 1607  Vitals shown include unvalidated device data.  Last Pain:  Vitals:   03/05/21 1258  TempSrc: Oral  PainSc: 1       Patients Stated Pain Goal: 5 (59/93/57 0177)  Complications: No complications documented.

## 2021-03-06 ENCOUNTER — Encounter (HOSPITAL_BASED_OUTPATIENT_CLINIC_OR_DEPARTMENT_OTHER): Payer: Self-pay | Admitting: Urology

## 2021-03-08 IMAGING — CT CT ABD-PELV W/ CM
2 of 5 series · 15 of 46 positions shown, 17 images · IV contrast (APPLIED)
Comparison: Pelvis ultrasound 06/22/2018.

CLINICAL DATA: 58-year-old male with acute abdominal pain and
nausea onset today.

EXAM:
CT ABDOMEN AND PELVIS WITH CONTRAST
TECHNIQUE: Multidetector CT imaging of the abdomen and pelvis was performed
using the standard protocol following bolus administration of
intravenous contrast.
CONTRAST:  125mL OMNIPAQUE IOHEXOL 300 MG/ML  SOLN

[Series 3: abd/ pelvis 5.0 i30f 2 · axial · 0.98mm/px · z∈[-698,-208]mm · 12 of 112 slices shown, 14 images]
[im 7/112  soft-tissue]
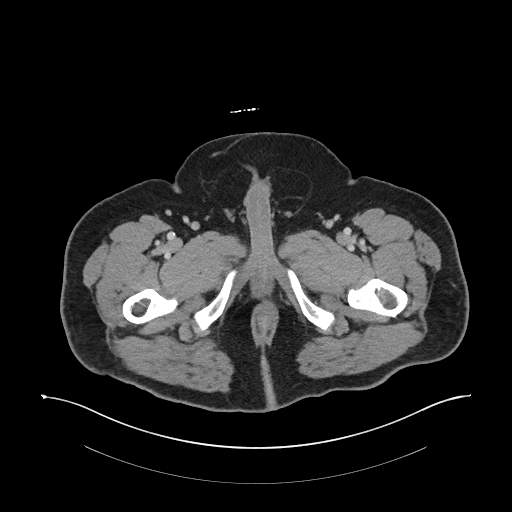
[im 7/112  bone]
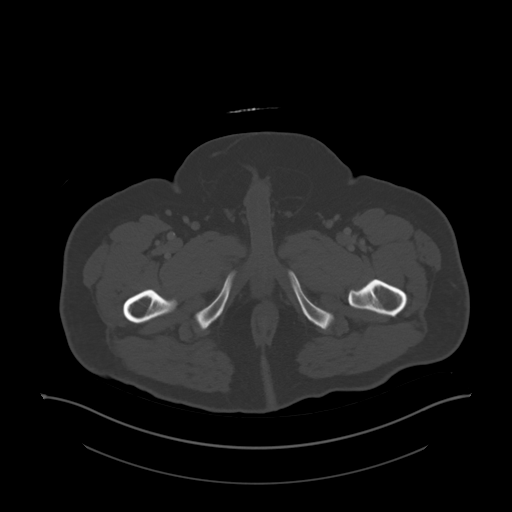
[im 20/112  soft-tissue]
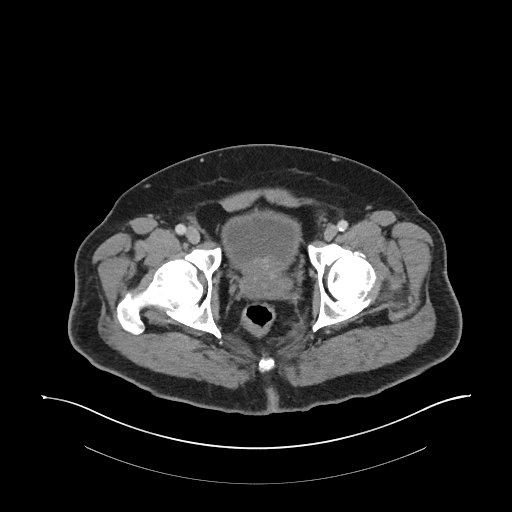
[im 27/112  soft-tissue]
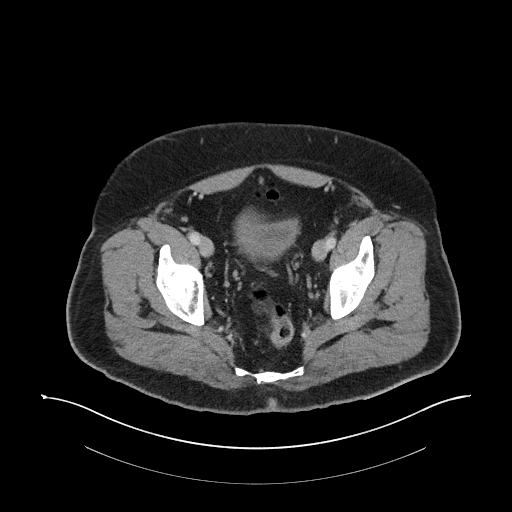
[im 33/112  soft-tissue]
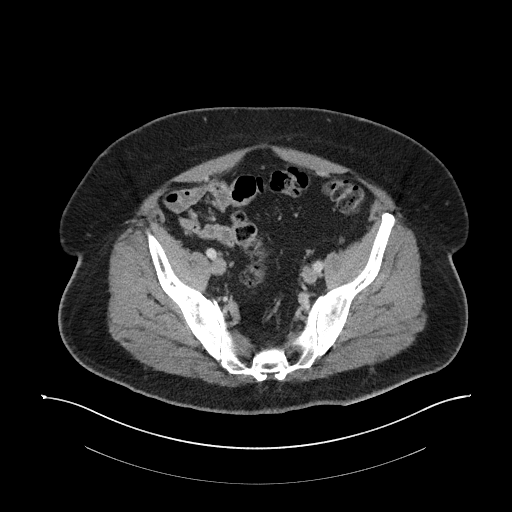
[im 46/112  soft-tissue]
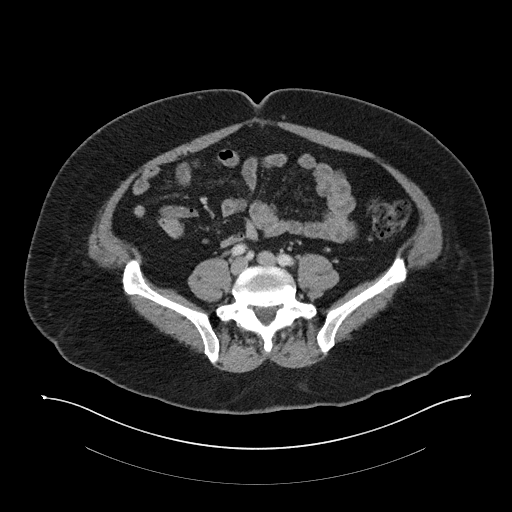
[im 53/112  soft-tissue]
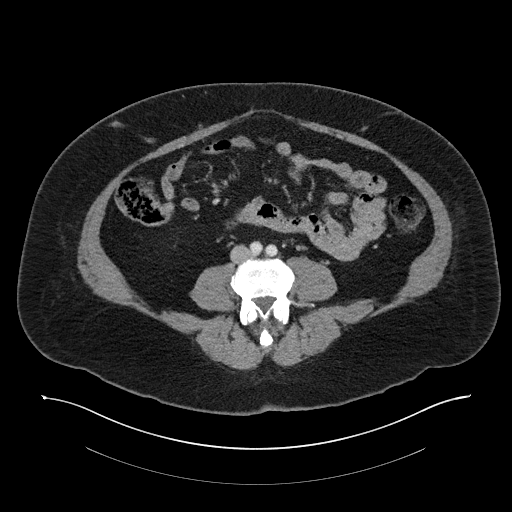
[im 59/112  soft-tissue]
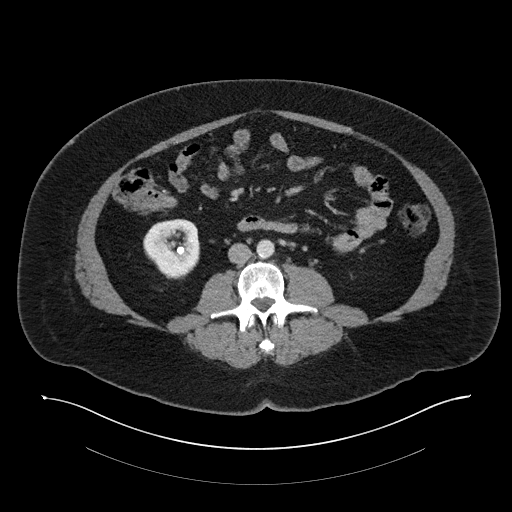
[im 72/112  soft-tissue]
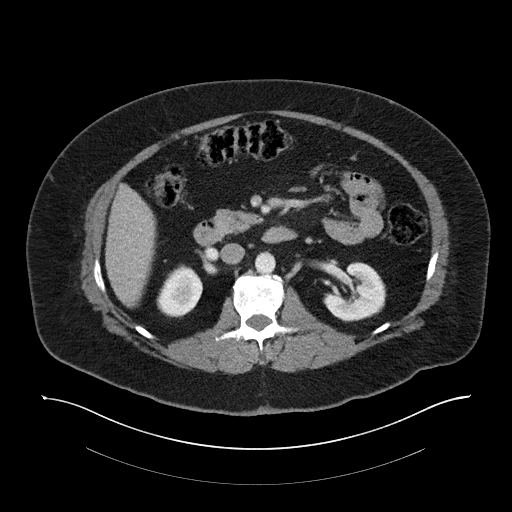
[im 79/112  soft-tissue]
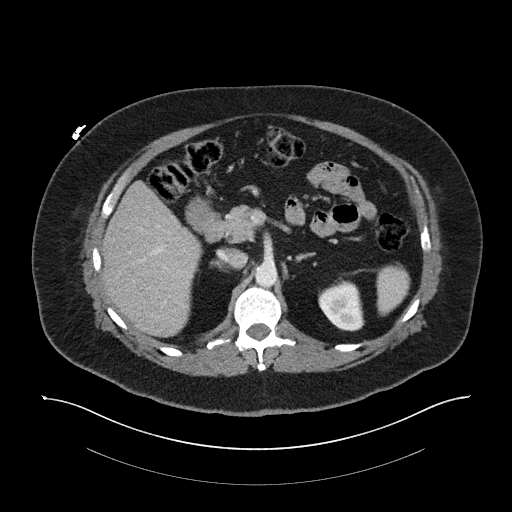
[im 79/112  bone]
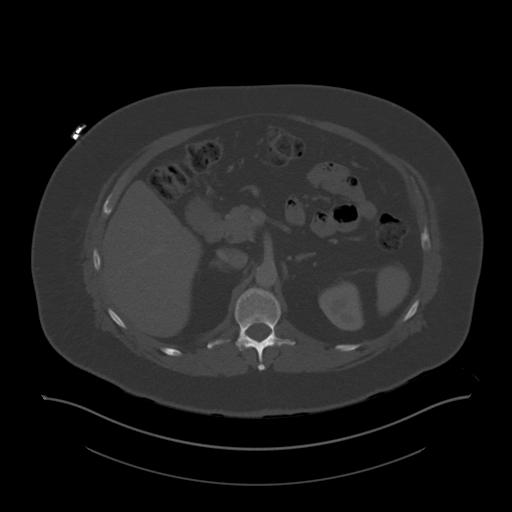
[im 85/112  soft-tissue]
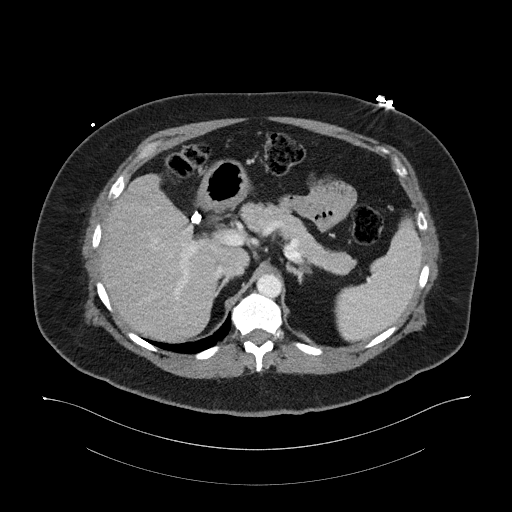
[im 98/112  soft-tissue]
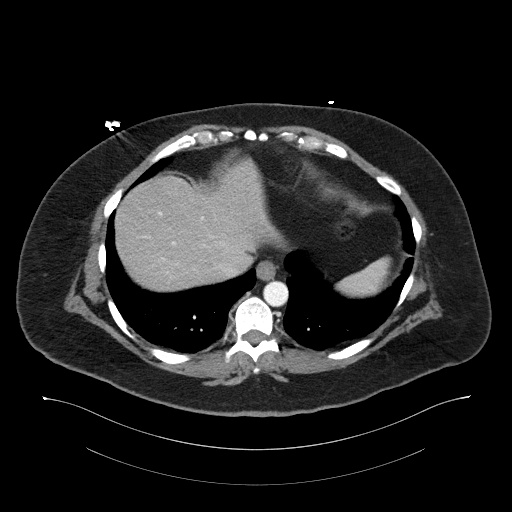
[im 105/112  soft-tissue]
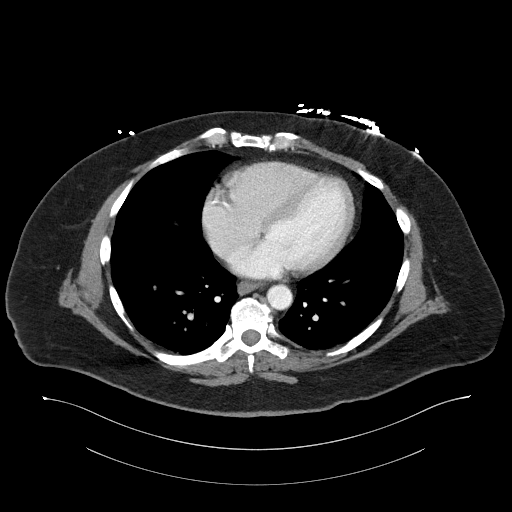

[Series 6: coronal soft tissue · coronal · 0.80mm/px · 3 of 102 slices shown]
[im 34/102  soft-tissue]
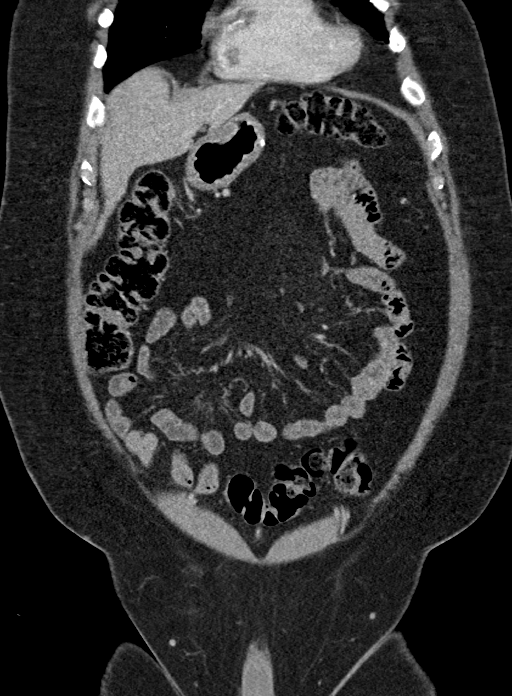
[im 45/102  soft-tissue]
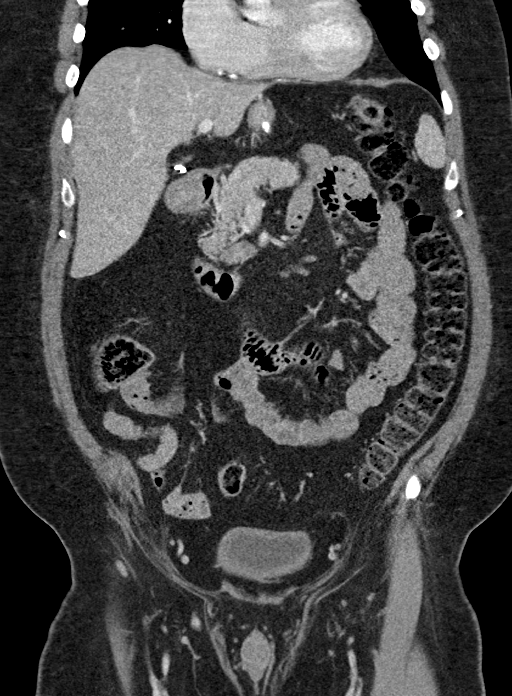
[im 57/102  soft-tissue]
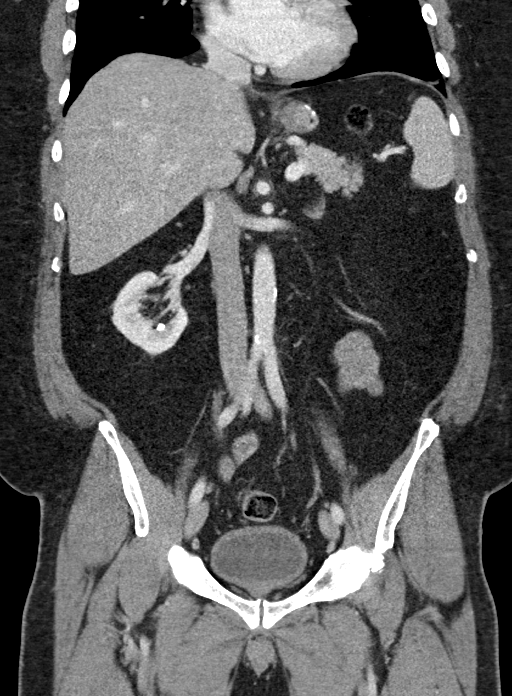

[15 of 46 positions shown; findings below may reference images not displayed]

FINDINGS: Lower chest: Cardiac size at the upper limits of normal. No
pericardial or pleural effusion. Mild dependent atelectasis.

Hepatobiliary: Surgically absent gallbladder. Negative liver. No
bile duct enlargement.

Pancreas: Negative.

Spleen: Negative.

Adrenals/Urinary Tract: Normal adrenal glands.

Left lower pole nephrolithiasis measuring up to 9 millimeters. No
left hydronephrosis or perinephric stranding. Decompressed and
negative left ureter.

Multifocal right nephrolithiasis, bulky 12 millimeter midpole
calculus. 7 millimeter lower pole calculus. There is also a
duplicated right renal collecting system and proximal ureter.
However, no right hydronephrosis, perinephric or periureteral
stranding. The ureters may converge at some point, the ureters
appear to converge at some point. The distal right ureter and UVJ
appears normal. Unremarkable urinary bladder.

Stomach/Bowel: Negative rectosigmoid colon. Retained stool in the
descending colon and at the splenic flexure which otherwise are
negative. Similar retained stool in the transverse colon. Negative
right colon. Diminutive or absent appendix with no large bowel
inflammation identified.

Decompressed and negative terminal ileum. No dilated small bowel. No
small bowel mesenteric stranding. Postoperative changes to the
stomach with a staple line all along the greater curve. Duodenum
appears negative. No free air, free fluid.

Vascular/Lymphatic: Mild Aortoiliac calcified atherosclerosis. Major
arterial structures remain patent. Portal venous system appears to
be patent. No lymphadenopathy.

Reproductive: Questionable small fat containing left inguinal
hernia.

Lobulated appearance of the prostate involving the floor of the
bladder. And the prostate is heterogeneous with dystrophic
calcifications (sagittal image 71).

Other: No pelvic free fluid.

Musculoskeletal: No acute osseous abnormality identified.
IMPRESSION: 1. Bulky right greater than left nephrolithiasis, with superimposed
duplicated right renal collecting system and proximal ureter.
However, no obstructive uropathy.
2. Lobulated appearance of the prostate at the floor of the bladder.
Recommend outpatient follow-up with urology.
3. No bowel inflammation or other acute process identified in the
abdomen or pelvis.
4. Postoperative changes to the stomach.

## 2021-03-16 ENCOUNTER — Other Ambulatory Visit: Payer: 59

## 2021-03-28 ENCOUNTER — Other Ambulatory Visit: Payer: Self-pay | Admitting: Urology

## 2021-04-12 NOTE — Patient Instructions (Addendum)
DUE TO COVID-19 ONLY ONE VISITOR IS ALLOWED TO COME WITH YOU AND STAY IN THE WAITING ROOM ONLY DURING PRE OP AND PROCEDURE DAY OF SURGERY.   TWO VISITOR  MAY VISIT WITH YOU AFTER SURGERY IN YOUR PRIVATE ROOM DURING VISITING HOURS ONLY!  YOU NEED TO HAVE A COVID 19 TEST ON___7-13-22____ @__3 ;00_____, THIS TEST MUST BE DONE BEFORE SURGERY,    COVID TESTING SITE [address] [place] [address],   IT IS ON THE RIGHT GOING OUT [address] APPROXIMATELY  2 MINUTES PAST [employer] ON THE RIGHT. ONCE YOUR COVID TEST IS COMPLETED,  PLEASE Wear a mask when in public           Your procedure is scheduled on: 04-27-21   Report to [facility] Main  Entrance   Report to admitting at     1215  PM                                                      bring cpap mask and tubing only     Call this number if you have problems the morning of surgery [phone]    Remember: Do not eat food :After Midnight. You may have clear liquids until  1115 am then nothing by mouth    CLEAR LIQUID DIET   Foods Allowed                                                                     Foods Excluded water Black Coffee and tea, regular and decaf                             liquids that you cannot  Plain Jell-O any favor except red or purple                                           see through such as: Fruit ices (not with fruit pulp)                                                  milk, soups, orange juice  Iced Popsicles                                                        All solid food Carbonated beverages, regular and diet                                    Cranberry, grape and apple juices Sports drinks like Gatorade Lightly seasoned clear broth or consume(fat  free) Sugar, honey syrup  _____________________________________________________________________    BRUSH YOUR TEETH MORNING OF SURGERY AND RINSE YOUR MOUTH OUT, NO CHEWING GUM CANDY OR MINTS.     Take these  medicines the morning of surgery with A SIP OF WATER: flomax, oxycodone if needed, gabapentin, flonase  DO NOT TAKE ANY DIABETIC MEDICATIONS DAY OF YOUR SURGERY                               You may not have any metal on your body including hair pins and              piercings  Do not wear jewelry, lotions, powders or perfumes, deodorant                      Men may shave face and neck.   Do not bring valuables to the hospital. [place].  Contacts, dentures or bridgework may not be worn into surgery.       Patients discharged the day of surgery will not be allowed to drive home. IF YOU ARE HAVING SURGERY AND GOING HOME THE SAME DAY, YOU MUST HAVE AN ADULT TO DRIVE YOU HOME AND BE WITH YOU FOR 24 HOURS. YOU MAY GO HOME BY TAXI OR UBER OR ORTHERWISE, BUT AN ADULT MUST ACCOMPANY YOU HOME AND STAY WITH YOU FOR 24 HOURS.  Name and phone number of your driver:  Special Instructions: N/A              Please read over the following fact sheets you were given: _____________________________________________________________________             [facility] - Preparing for Surgery Before surgery, you can play an important role.  Because skin is not sterile, your skin needs to be as free of germs as possible.  You can reduce the number of germs on your skin by washing with CHG (chlorahexidine gluconate) soap before surgery.  CHG is an antiseptic cleaner which kills germs and bonds with the skin to continue killing germs even after washing. Please DO NOT use if you have an allergy to CHG or antibacterial soaps.  If your skin becomes reddened/irritated stop using the CHG and inform your nurse when you arrive at Short Stay. Do not shave (including legs and underarms) for at least 48 hours prior to the first CHG shower.  You may shave your face/neck. Please follow these instructions carefully:  1.  Shower with CHG Soap the night before surgery and the   morning of Surgery.  2.  If you choose to wash your hair, wash your hair first as usual with your  normal  shampoo.  3.  After you shampoo, rinse your hair and body thoroughly to remove the  shampoo.                           4.  Use CHG as you would any other liquid soap.  You can apply chg directly  to the skin and wash                       Gently with a scrungie or clean washcloth.  5.  Apply the CHG Soap to your body ONLY FROM THE NECK DOWN.  Do not use on face/ open                           Wound or open sores. Avoid contact with eyes, ears mouth and genitals (private parts).                       Wash face,  Genitals (private parts) with your normal soap.             6.  Wash thoroughly, paying special attention to the area where your surgery  will be performed.  7.  Thoroughly rinse your body with warm water from the neck down.  8.  DO NOT shower/wash with your normal soap after using and rinsing off  the CHG Soap.                9.  Pat yourself dry with a clean towel.            10.  Wear clean pajamas.            11.  Place clean sheets on your bed the night of your first shower and do not  sleep with pets. Day of Surgery : Do not apply any lotions/deodorants the morning of surgery.  Please wear clean clothes to the hospital/surgery center.  FAILURE TO FOLLOW THESE INSTRUCTIONS MAY RESULT IN THE CANCELLATION OF YOUR SURGERY PATIENT SIGNATURE_________________________________  NURSE SIGNATURE__________________________________  ________________________________________________________________________

## 2021-04-13 ENCOUNTER — Encounter (HOSPITAL_COMMUNITY): Payer: Self-pay

## 2021-04-13 ENCOUNTER — Encounter (HOSPITAL_COMMUNITY)
Admission: RE | Admit: 2021-04-13 | Discharge: 2021-04-13 | Disposition: A | Discharge status: Home / Self Care | Payer: 59 | Source: Ambulatory Visit | Attending: Urology | Admitting: Urology

## 2021-04-13 ENCOUNTER — Other Ambulatory Visit: Payer: Self-pay

## 2021-04-13 DIAGNOSIS — Z01812 Encounter for preprocedural laboratory examination: Secondary | ICD-10-CM | POA: Diagnosis not present

## 2021-04-13 HISTORY — DX: Other specified postprocedural states: R11.2

## 2021-04-13 HISTORY — DX: Other specified postprocedural states: Z98.890

## 2021-04-13 HISTORY — DX: Malignant (primary) neoplasm, unspecified: C80.1

## 2021-04-13 LAB — BASIC METABOLIC PANEL
Anion gap: 9 (ref 5–15)
BUN: 19 mg/dL (ref 6–20)
CO2: 28 mmol/L (ref 22–32)
Calcium: 9.6 mg/dL (ref 8.9–10.3)
Chloride: 102 mmol/L (ref 98–111)
Creatinine, Ser: 1.13 mg/dL (ref 0.61–1.24)
GFR, Estimated: >60 mL/min (ref >60)
Glucose, Bld: 162 mg/dL — ABNORMAL HIGH (ref 70–99)
Potassium: 3.9 mmol/L (ref 3.5–5.1)
Sodium: 139 mmol/L (ref 135–145)

## 2021-04-13 LAB — CBC
HCT: 43.2 % (ref 39.0–52.0)
Hemoglobin: 14.8 g/dL (ref 13.0–17.0)
MCH: 28.9 pg (ref 26.0–34.0)
MCHC: 34.3 g/dL (ref 30.0–36.0)
MCV: 84.4 fL (ref 80.0–100.0)
Platelets: 199 10*3/uL (ref 150–400)
RBC: 5.12 MIL/uL (ref 4.22–5.81)
RDW: 13 % (ref 11.5–15.5)
WBC: 5.5 10*3/uL (ref 4.0–10.5)
nRBC: 0 % (ref 0.0–0.2)

## 2021-04-13 LAB — GLUCOSE, CAPILLARY: Glucose-Capillary: 188 mg/dL — ABNORMAL HIGH (ref 70–99)

## 2021-04-13 NOTE — Progress Notes (Addendum)
PCP - Dr. Clayborn Heron  Cardiologist - no  PPM/ICD -  Device Orders -  Rep Notified -   Chest x-ray - 1 view 12-19-20 EKG - 12-16-20 Stress Test -  ECHO -  Cardiac Cath -   Sleep Study -  CPAP - yes  Fasting Blood Sugar -  Checks Blood Sugar _____ times a day  Blood Thinner Instructions: Aspirin Instructions:  ERAS Protcol - PRE-SURGERY Ensure or G2-   COVID TEST- 7-13   3p  Activity--Can walk a flight of stairs without SOB  Anesthesia review: OSA DM HTN  Patient denies shortness of breath, fever, cough and chest pain at PAT appointment   All instructions explained to the patient, with a verbal understanding of the material. Patient agrees to go over the instructions while at home for a better understanding. Patient also instructed to self quarantine after being tested for COVID-19. The opportunity to ask questions was provided.

## 2021-04-14 LAB — HEMOGLOBIN A1C
Hgb A1c MFr Bld: 6.5 % — ABNORMAL HIGH (ref 4.8–5.6)
Mean Plasma Glucose: 140 mg/dL

## 2021-04-25 ENCOUNTER — Other Ambulatory Visit (HOSPITAL_COMMUNITY)
Admission: RE | Admit: 2021-04-25 | Discharge: 2021-04-25 | Disposition: A | Discharge status: Home / Self Care | Payer: 59 | Source: Ambulatory Visit | Attending: Urology | Admitting: Urology

## 2021-04-25 DIAGNOSIS — Z01812 Encounter for preprocedural laboratory examination: Secondary | ICD-10-CM | POA: Diagnosis not present

## 2021-04-25 DIAGNOSIS — Z20822 Contact with and (suspected) exposure to covid-19: Secondary | ICD-10-CM | POA: Insufficient documentation

## 2021-04-25 LAB — SARS CORONAVIRUS 2 (TAT 6-24 HRS): SARS Coronavirus 2: NEGATIVE

## 2021-04-27 ENCOUNTER — Encounter (HOSPITAL_COMMUNITY)
Admission: RE | Disposition: A | Discharge status: Home / Self Care | Payer: Self-pay | Source: Ambulatory Visit | Attending: Urology

## 2021-04-27 ENCOUNTER — Ambulatory Visit (HOSPITAL_COMMUNITY): Payer: 59 | Admitting: Anesthesiology

## 2021-04-27 ENCOUNTER — Ambulatory Visit (HOSPITAL_COMMUNITY)
Admission: RE | Admit: 2021-04-27 | Discharge: 2021-04-27 | Disposition: A | Discharge status: Home / Self Care | Payer: 59 | Source: Ambulatory Visit | Attending: Urology | Admitting: Urology

## 2021-04-27 ENCOUNTER — Encounter (HOSPITAL_COMMUNITY): Payer: Self-pay | Admitting: Urology

## 2021-04-27 ENCOUNTER — Ambulatory Visit (HOSPITAL_COMMUNITY): Payer: 59

## 2021-04-27 DIAGNOSIS — Z87440 Personal history of urinary (tract) infections: Secondary | ICD-10-CM | POA: Diagnosis not present

## 2021-04-27 DIAGNOSIS — N200 Calculus of kidney: Secondary | ICD-10-CM | POA: Diagnosis not present

## 2021-04-27 DIAGNOSIS — I10 Essential (primary) hypertension: Secondary | ICD-10-CM | POA: Insufficient documentation

## 2021-04-27 DIAGNOSIS — Z87442 Personal history of urinary calculi: Secondary | ICD-10-CM | POA: Diagnosis not present

## 2021-04-27 DIAGNOSIS — E119 Type 2 diabetes mellitus without complications: Secondary | ICD-10-CM | POA: Diagnosis not present

## 2021-04-27 DIAGNOSIS — E669 Obesity, unspecified: Secondary | ICD-10-CM | POA: Insufficient documentation

## 2021-04-27 DIAGNOSIS — R350 Frequency of micturition: Secondary | ICD-10-CM | POA: Diagnosis not present

## 2021-04-27 DIAGNOSIS — Z85828 Personal history of other malignant neoplasm of skin: Secondary | ICD-10-CM | POA: Diagnosis not present

## 2021-04-27 DIAGNOSIS — Z6837 Body mass index (BMI) 37.0-37.9, adult: Secondary | ICD-10-CM | POA: Insufficient documentation

## 2021-04-27 DIAGNOSIS — Z87892 Personal history of anaphylaxis: Secondary | ICD-10-CM | POA: Insufficient documentation

## 2021-04-27 DIAGNOSIS — N401 Enlarged prostate with lower urinary tract symptoms: Secondary | ICD-10-CM | POA: Diagnosis not present

## 2021-04-27 DIAGNOSIS — Z7984 Long term (current) use of oral hypoglycemic drugs: Secondary | ICD-10-CM | POA: Diagnosis not present

## 2021-04-27 DIAGNOSIS — Z91030 Bee allergy status: Secondary | ICD-10-CM | POA: Diagnosis not present

## 2021-04-27 DIAGNOSIS — Z79899 Other long term (current) drug therapy: Secondary | ICD-10-CM | POA: Insufficient documentation

## 2021-04-27 HISTORY — PX: CYSTOSCOPY/URETEROSCOPY/HOLMIUM LASER/STENT PLACEMENT: SHX6546

## 2021-04-27 LAB — GLUCOSE, CAPILLARY: Glucose-Capillary: 123 mg/dL — ABNORMAL HIGH (ref 70–99)

## 2021-04-27 SURGERY — CYSTOSCOPY/URETEROSCOPY/HOLMIUM LASER/STENT PLACEMENT
Anesthesia: General | Laterality: Right

## 2021-04-27 MED ORDER — FENTANYL CITRATE (PF) 250 MCG/5ML IJ SOLN
INTRAMUSCULAR | Status: DC | PRN
  Administered 2021-04-27: 50 ug via INTRAVENOUS
  Administered 2021-04-27 (×2): 100 ug via INTRAVENOUS

## 2021-04-27 MED ORDER — EPHEDRINE 5 MG/ML INJ
INTRAVENOUS | Status: AC
  Filled 2021-04-27: qty 10

## 2021-04-27 MED ORDER — EPHEDRINE SULFATE-NACL 50-0.9 MG/10ML-% IV SOSY
PREFILLED_SYRINGE | INTRAVENOUS | Status: DC | PRN
  Administered 2021-04-27: 10 mg via INTRAVENOUS

## 2021-04-27 MED ORDER — ORAL CARE MOUTH RINSE: 15.0000 mL | Freq: Once | OROMUCOSAL | Status: AC

## 2021-04-27 MED ORDER — APREPITANT 40 MG PO CAPS
ORAL_CAPSULE | ORAL | Status: AC
  Filled 2021-04-27: qty 1

## 2021-04-27 MED ORDER — HYDROMORPHONE HCL 1 MG/ML IJ SOLN: 0.2500 mg | INTRAMUSCULAR | Status: DC | PRN

## 2021-04-27 MED ORDER — SODIUM CHLORIDE 0.9 % IV SOLN
2.0000 g | Freq: Once | INTRAVENOUS | Status: AC
  Administered 2021-04-27: 2 g via INTRAVENOUS
  Filled 2021-04-27: qty 2

## 2021-04-27 MED ORDER — CHLORHEXIDINE GLUCONATE 0.12 % MT SOLN
15.0000 mL | Freq: Once | OROMUCOSAL | Status: AC
  Administered 2021-04-27: 15 mL via OROMUCOSAL

## 2021-04-27 MED ORDER — PROPOFOL 10 MG/ML IV BOLUS
INTRAVENOUS | Status: AC
  Filled 2021-04-27: qty 20

## 2021-04-27 MED ORDER — LACTATED RINGERS IV SOLN: INTRAVENOUS | Status: DC

## 2021-04-27 MED ORDER — APREPITANT 40 MG PO CAPS
40.0000 mg | ORAL_CAPSULE | Freq: Once | ORAL | Status: AC
  Administered 2021-04-27: 40 mg via ORAL

## 2021-04-27 MED ORDER — DEXAMETHASONE SODIUM PHOSPHATE 10 MG/ML IJ SOLN
INTRAMUSCULAR | Status: DC | PRN
  Administered 2021-04-27: 10 mg via INTRAVENOUS

## 2021-04-27 MED ORDER — ONDANSETRON HCL 4 MG/2ML IJ SOLN
INTRAMUSCULAR | Status: DC | PRN
  Administered 2021-04-27: 4 mg via INTRAVENOUS

## 2021-04-27 MED ORDER — MIDAZOLAM HCL 2 MG/2ML IJ SOLN
INTRAMUSCULAR | Status: DC | PRN
  Administered 2021-04-27 (×2): 1 mg via INTRAVENOUS

## 2021-04-27 MED ORDER — OXYCODONE HCL 5 MG/5ML PO SOLN: 5.0000 mg | Freq: Once | ORAL | Status: DC | PRN

## 2021-04-27 MED ORDER — FENTANYL CITRATE (PF) 250 MCG/5ML IJ SOLN
INTRAMUSCULAR | Status: AC
  Filled 2021-04-27: qty 5

## 2021-04-27 MED ORDER — SODIUM CHLORIDE 0.9 % IR SOLN
Status: DC | PRN
  Administered 2021-04-27 (×2): 3000 mL via INTRAVESICAL

## 2021-04-27 MED ORDER — PHENYLEPHRINE HCL-NACL 10-0.9 MG/250ML-% IV SOLN
INTRAVENOUS | Status: AC
  Filled 2021-04-27: qty 250

## 2021-04-27 MED ORDER — CEPHALEXIN 500 MG PO CAPS: 500.0000 mg | ORAL_CAPSULE | Freq: Two times a day (BID) | ORAL | 0 refills | Status: AC

## 2021-04-27 MED ORDER — LIDOCAINE 2% (20 MG/ML) 5 ML SYRINGE
INTRAMUSCULAR | Status: DC | PRN
  Administered 2021-04-27: 80 mg via INTRAVENOUS

## 2021-04-27 MED ORDER — ONDANSETRON HCL 4 MG/2ML IJ SOLN: 4.0000 mg | Freq: Once | INTRAMUSCULAR | Status: DC | PRN

## 2021-04-27 MED ORDER — DOCUSATE SODIUM 100 MG PO CAPS: 100.0000 mg | ORAL_CAPSULE | Freq: Every day | ORAL | 0 refills | Status: DC | PRN

## 2021-04-27 MED ORDER — OXYCODONE HCL 5 MG PO TABS: 5.0000 mg | ORAL_TABLET | Freq: Once | ORAL | Status: DC | PRN

## 2021-04-27 MED ORDER — MIDAZOLAM HCL 2 MG/2ML IJ SOLN
INTRAMUSCULAR | Status: AC
  Filled 2021-04-27: qty 2

## 2021-04-27 MED ORDER — PROPOFOL 10 MG/ML IV BOLUS
INTRAVENOUS | Status: DC | PRN
  Administered 2021-04-27: 160 mg via INTRAVENOUS

## 2021-04-27 MED ORDER — IOHEXOL 300 MG/ML  SOLN
INTRAMUSCULAR | Status: DC | PRN
  Administered 2021-04-27: 4 mL via URETHRAL

## 2021-04-27 MED ORDER — OXYCODONE-ACETAMINOPHEN 5-325 MG PO TABS: 1.0000 | ORAL_TABLET | ORAL | 0 refills | Status: DC | PRN

## 2021-04-27 SURGICAL SUPPLY — 23 items
BAG URO CATCHER STRL LF (MISCELLANEOUS) ×2 IMPLANT
BASKET ZERO TIP NITINOL 2.4FR (BASKET) IMPLANT
CATH URET 5FR 28IN OPEN ENDED (CATHETERS) ×2 IMPLANT
CLOTH BEACON ORANGE TIMEOUT ST (SAFETY) IMPLANT
FIBER LASER MOSES 200 DFL (Laser) IMPLANT
GLOVE SURG ENC TEXT LTX SZ7 (GLOVE) ×2 IMPLANT
GOWN STRL REUS W/TWL LRG LVL3 (GOWN DISPOSABLE) ×2 IMPLANT
GUIDEWIRE STR DUAL SENSOR (WIRE) ×4 IMPLANT
GUIDEWIRE ZIPWRE .038 STRAIGHT (WIRE) IMPLANT
IV NS 1000ML (IV SOLUTION) ×1
IV NS 1000ML BAXH (IV SOLUTION) ×1 IMPLANT
KIT TURNOVER KIT A (KITS) ×2 IMPLANT
LASER FIB FLEXIVA PULSE ID 365 (Laser) IMPLANT
MANIFOLD NEPTUNE II (INSTRUMENTS) ×2 IMPLANT
PACK CYSTO (CUSTOM PROCEDURE TRAY) ×2 IMPLANT
SHEATH URETERAL 12FR 45CM (SHEATH) ×2 IMPLANT
SHEATH URETERAL 12FRX35CM (MISCELLANEOUS) IMPLANT
STENT URET 6FRX24 CONTOUR (STENTS) ×2 IMPLANT
STENT URET 6FRX26 CONTOUR (STENTS) IMPLANT
TRACTIP FLEXIVA PULS ID 200XHI (Laser) IMPLANT
TRACTIP FLEXIVA PULSE ID 200 (Laser)
TUBING CONNECTING 10 (TUBING) ×2 IMPLANT
TUBING UROLOGY SET (TUBING) ×2 IMPLANT

## 2021-04-27 NOTE — H&P (Signed)
Office Visit Report     04/05/2021   --------------------------------------------------------------------------------   Adam B. Yau  MRN: [id number]  DOB: August 26, 1961, 60 year old Male  SSN:    PRIMARY CARE:  Donald Prose  REFERRING:    PROVIDER:  Rexene Alberts, M.D.  LOCATION:  [facility] [phone]     --------------------------------------------------------------------------------   CC/HPI: Adam Ford is a 60 year old male seen in follow-up today for history of bilateral nephrolithiasis.   He is seen as a work in today for concern for UTI. He said that he had a home urinalysis to demonstrate leukocyte esterase. He does report dysuria and pain in his glans of his penis with voiding. He also has intermittent stent discomfort. He has taken Ditropan, tamsulosin once some relief. Urine culture 03/22/2021 with no growth.   He has had a long history of urolithiasis. He has passed stones in the past, has had ESWL and ureteroscopy. He denies history of PCNL. Most recent CT A/P 12/16/2020 demonstrated bilateral nonobstructing renal calculi. My read: Left lower pole: 1 cm x 5 mm stone. Right lower pole: 8 mm x 8 mm, 1.2 cm x 1.4 cm and a right lower pole calyx that is slightly more superior. 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 Both of the stones on the right are in the lower pole moiety.  I taken to the OR on 03/05/2021 for right ureteroscopy. He was found to have right proximal ureteral stricture and I was unable to gain access into his right lower pole moiety. I thus left a stent for passive dilation.   Of note, he was recently admitted and discharged on 12/20/2020 with sepsis secondary to E. coli urinary tract infection. Urine culture resulted pansensitive E. coli. He received a 3-day course of Rocephin and discharged home on a course of Keflex for 7 days. CT did not show any evidence of pyelonephritis.   He does complain of some lower urinary tract  symptoms. IPSS score is 6, quality of life 3. He does complain of frequency, intermittency, urgency and 2 time nocturia.   He does have past medical history of diabetes, hypertension, obesity.     ALLERGIES: None   MEDICATIONS: Ditropan Xl 10 mg tablet, extended release 24 hr 1 tablet PO Daily  Metformin Hcl  Metoprolol Succinate  Percocet 5 mg-325 mg tablet 1 tablet PO Q 4 H PRN  Tamsulosin Hcl 0.4 mg capsule 1 capsule PO Daily  Amlodipine Besylate  Dicyclomine Hcl 10 mg capsule  Gabapentin  Pyridium 100 mg tablet  Rosuvastatin Calcium  Zofran 4 mg tablet     GU PSH: Cystoscopy - 02/09/2020 Cystoscopy Insert Stent - 03/05/2021 Cystoscopy Ureteroscopy - 03/05/2021     NON-GU PSH: Appendectomy Cholecystectomy (laparoscopic) Gastric sleeve Tonsillectomy     GU PMH: BPH w/LUTS - 03/22/2021, - 01/25/2021 Renal calculus, Bilateral - 03/22/2021, Bilateral, - 9/56/3875, Bilateral, 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 His stone burden on the right is in the lower moiety. He has bilateral non-obstructing stones. The 2 largest stones are identified in the mid pole and lower pole area of the right kidney. The mid pole stone measures approximately 12 mm, lower pole calculi measures between 7 and 8 mm respectively. Largest stone on the left identified in the left lower pole measures between 4 and 5 mm. These are grossly stable when compared to KUB imaging performed last year., - 01/05/2021, Bilateral, He has bilateral renal calculi. We discussed proceeding with metabolic evaluation however he  was inclined not to proceed in that fashion but would rather continue to monitor his stones since he has not had a stone episode in 7 years. I will therefore have him return in 6 months for a KUB to assure stability., - 07/14/2019 Urinary Frequency - 03/22/2021, - 01/25/2021 Weak Urinary Stream - 01/25/2021 Acute Cystitis/UTI - 01/05/2021 Microscopic hematuria - 02/03/2020 Abnormal radiologic findings on diagnositic  imaging of other urinary organs, What is being seen on his CT scan appears to be his median lobe. I found no abnormality whatsoever on his rectal exam. I will obtain a screening PSA. I have reassured him I feel that this is of no clinical significance. - 07/14/2019 BPH w/o LUTS, He does have some prostate enlargement. It is noted on his CT scan and on examination but does not have any significant voiding symptoms. - 07/14/2019    NON-GU PMH: Anxiety Diabetes Type 2 Hypercholesterolemia Hypertension Sleep Apnea    FAMILY HISTORY: Colon Cancer - Grandmother   SOCIAL HISTORY: Marital Status: Married Preferred Language: English; Ethnicity: Not Hispanic Or Latino; Race: White Current Smoking Status: Patient has never smoked.   Tobacco Use Assessment Completed: Used Tobacco in last 30 days? Does not drink anymore.  Drinks 4+ caffeinated drinks per day.    REVIEW OF SYSTEMS:    GU Review Male:   Patient denies frequent urination, hard to postpone urination, burning/ pain with urination, get up at night to urinate, leakage of urine, stream starts and stops, trouble starting your stream, have to strain to urinate , erection problems, and penile pain.  Gastrointestinal (Upper):   Patient denies nausea, vomiting, and indigestion/ heartburn.  Gastrointestinal (Lower):   Patient denies diarrhea and constipation.  Constitutional:   Patient denies weight loss, night sweats, fatigue, and fever.  Skin:   Patient denies skin rash/ lesion and itching.  Eyes:   Patient denies blurred vision and double vision.  Ears/ Nose/ Throat:   Patient denies sore throat and sinus problems.  Hematologic/Lymphatic:   Patient denies swollen glands and easy bruising.  Cardiovascular:   Patient denies leg swelling and chest pains.  Respiratory:   Patient denies cough and shortness of breath.  Endocrine:   Patient denies excessive thirst.  Musculoskeletal:   Patient denies back pain and joint pain.  Neurological:    Patient denies headaches and dizziness.  Psychologic:   Patient denies depression and anxiety.   VITAL SIGNS: None   MULTI-SYSTEM PHYSICAL EXAMINATION:    Constitutional: Well-nourished. No physical deformities. Normally developed. Good grooming.  Respiratory: No labored breathing, no use of accessory muscles.   Cardiovascular: Normal temperature, normal extremity pulses, no swelling, no varicosities.  Gastrointestinal: No mass, no tenderness, no rigidity, obese  Musculoskeletal: Normal gait and station of head and neck.     Complexity of Data:  Source Of History:  Patient, Medical Record Summary  Records Review:   AUA Symptom Score  Urine Test Review:   Urinalysis  Urodynamics Review:   Review Bladder Scan  X-Ray Review: C.T. Abdomen/Pelvis: Reviewed Films. Reviewed Report. Discussed With Patient.     07/14/19  PSA  Total PSA 1.19 ng/mL    PROCEDURES:          Urinalysis w/Scope Dipstick Dipstick Cont'd Micro  Color: Yellow Bilirubin: Neg mg/dL WBC/hpf: 0 - 5/hpf  Appearance: Slightly Cloudy Ketones: Neg mg/dL RBC/hpf: 20 - 40/hpf  Specific Gravity: 1.025 Blood: 3+ ery/uL Bacteria: Rare (0-9/hpf)  pH: <=5.0 Protein: Trace mg/dL Cystals: NS (Not Seen)  Glucose: Neg  mg/dL Urobilinogen: 0.2 mg/dL Casts: NS (Not Seen)    Nitrites: Neg Trichomonas: Not Present    Leukocyte Esterase: Neg leu/uL Mucous: Not Present      Epithelial Cells: 0 - 5/hpf      Yeast: NS (Not Seen)      Sperm: Not Present    ASSESSMENT:      ICD-10 Details  1 GU:   Renal calculus - N20.0 Bilateral  2   BPH w/LUTS - N40.1   3   Urinary Frequency - R35.0    PLAN:            Medications New Meds: Uribel 118 mg-10 mg-40.8 mg-36 mg-0.12 mg capsule 1 capsule PO QID PRN   #30  0 Refill(s)            Orders Labs CULTURE, URINE          Document Letter(s):  Created for Patient: Clinical Summary         Notes:   2. Bilateral renal stones:  -CT A/P 12/16/2020 demonstrated bilateral nonobstructing  renal calculi. My read: Left lower pole: 1 cm x 5 mm stone. Right lower pole: 8 mm x 8 mm, 1.2 cm x 1.4 cm and a right lower pole calyx that is slightly more superior. 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 Both of the stones on the right are in the lower pole moiety.  -S/p R URS that demonstrated proximal right ureteral stricture of the right lower pole moiety. As unable to gain access into his right lower pole moiety to treat his stones. I thus left a stent for passive dilation. We discussed options today including surveillance, second look ureteroscopy or PCNL. He elects to proceed with second look ureteroscopy with possible balloon dilation of his right lower pole ureter. We discussed risk and benefits.  -We also discussed option if this were to fail. We could proceed with PCNL. He also requested options including pyeloplasty or right partial nephrectomy. We will consider Lasix renogram following treatment of his right lower pole stones.  -Send urine for culture today  -He is scheduled for surgery on 04/27/2021  -Continue Ditropan, tamsulosin. Also prescribed Uribel for his stent discomfort.   We discussed the options for management of kidney stones, including observation, ESWL, ureteroscopy with laser lithotripsy, and PCNL. The risks and benefits of each option were discussed.  For observation I described the risks which include but are not limited to silent renal damage, life-threatening infection, need for emergent surgery, failure to pass stone, and pain.   ESWL: risks and benefits of ESWL were outlined including infection, bleeding, pain, steinstrasse, kidney injury, need for ancillary treatments, and global anesthesia risks including but not limited to CVA, MI, DVT, PE, pneumonia, and death.   Ureteroscopy: risks and benefits of ureteroscopy were outlined, including infection, bleeding, pain, temporary ureteral stent and associated stent bother, ureteral injury,  ureteral stricture, need for ancillary treatments, and global anesthesia risks including but not limited to CVA, MI, DVT, PE, pneumonia, and death.   PCNL: risks and benefits of PCNL were outlined including infection, bleeding, blood transfusion, pain, pneumothorax, bowel injury, persistent urine leak, positioning injury, inability to clear stone burden, renal laceration, arterial venous fistula or malformation, need for ancillary treatments, and global anesthesia risks including but not limited to CVA, MI, DVT, PE, pneumonia, and death.    2. BPH with LUTS: Mild symptoms. He does not wish to start medicine at this time.   CC:  Donald Prose, MD        Next Appointment:      Next Appointment: 04/27/2021 02:30 PM    Appointment Type: Surgery     Location: [facility] [phone]    Provider: Rexene Alberts, M.D.    Reason for Visit: WL/OP CYSTO RT RPG, RT URS, LL, ST STENT POSSIBLE RIGHT UR BALLOON DILATION       Signed by Rexene Alberts, M.D. on 04/05/21 at 2:56 PM (EDT)  Urology Preoperative H&P   Chief Complaint: Right renal stones  History of Present Illness: Adam Ford is a 60 y.o. male with cysto, R RPG, R URS/LL, R stent. He denies fevers or chills.    Past Medical History:  Diagnosis Date   Anxiety    Blepharitis, both eyes    Bulging of thoracic intervertebral disc    T8   Cancer (HCC)    basal cell nose   Glaucoma, both eyes    per pt no eye drops ,   Headache    with neuralgia   History of basal cell carcinoma (BCC) excision    s/p moh's surgery of nose in 2020   History of kidney stones    History of sepsis 12/16/2020   hospital admission in epic, secondary to UTI with ecoli and pyelonephritis   Hypercholesteremia    Hypertension    followed by pcp   OSA on CPAP    PONV (postoperative nausea and vomiting)    Renal calculi    bilateral non-obstructive per abd imaging 02-19-2021   Supraorbital neuralgia    neurologist--- dr Jaynee Eagles----  mostly  right side, takes gabapentin   Type 2 diabetes mellitus ([place])    followed by pcp  ---  (03-01-2021 pt stated checks blood sugar once wkly one hour after eats,  average 130--140)   Wears glasses     Past Surgical History:  Procedure Laterality Date   APPENDECTOMY  1969   CYSTOSCOPY/URETEROSCOPY/HOLMIUM LASER/STENT PLACEMENT  last one 2012 approx.   CYSTOSCOPY/URETEROSCOPY/HOLMIUM LASER/STENT PLACEMENT Right 03/05/2021   Procedure: CYSTOSCOPY/RETROGRADE/URETEROSCOPY/STENT PLACEMENT;  Surgeon: Janith Lima, MD;  Location: [facility]  Service: Urology;  Laterality: Right;   EXTRACORPOREAL SHOCK WAVE LITHOTRIPSY  last one 2012  approx.   GASTRIC SLEEVE  2017 approx   LAPAROSCOPIC CHOLECYSTECTOMY  2008 approx   lasers yag iridotomy Bilateral    LIPOMA EXCISION Left 2020   Ear Dr. Redmond Baseman   MOHS SURGERY  2020   nose   NASAL TURBINATE REDUCTION  2018   NASAL VALVE COLLAPSE REPAIR  2019   TONSILLECTOMY  child   TRABECULECTOMY Bilateral 2019   w/  yag laser   VASECTOMY      Allergies:  Allergies  Allergen Reactions   Bee Venom Anaphylaxis    Family History  Problem Relation Age of Onset   Hypertension Mother    Hypertension Father    Aneurysm Father    Colon cancer Maternal Grandmother    Stroke Paternal Grandfather    Stroke Other    High Cholesterol Other        on both sides   Other Neg Hx     Social History:  reports that he has never smoked. He has never used smokeless tobacco. He reports that he does not drink alcohol and does not use drugs.  ROS: A complete review of systems was performed.  All systems are negative except for pertinent findings as noted.  Physical Exam:  Vital signs in  last 24 hours: Temp:  [97.6 F (36.4 C)] 97.6 F (36.4 C) (07/15 1230) Pulse Rate:  [74] 74 (07/15 1230) Resp:  [18] 18 (07/15 1230) BP: (155)/(90) 155/90 (07/15 1230) SpO2:  [98 %] 98 % (07/15 1230) Weight:  [119.7 kg] 119.7 kg (07/15 1230) Constitutional:   Alert and oriented, No acute distress Cardiovascular: Regular rate and rhythm Respiratory: Normal respiratory effort, Lungs clear bilaterally GI: Abdomen is soft, nontender, nondistended, no abdominal masses GU: No CVA tenderness Lymphatic: No lymphadenopathy Neurologic: Grossly intact, no focal deficits Psychiatric: Normal mood and affect  Laboratory Data:  No results for input(s): WBC, HGB, HCT, PLT in the last 72 hours.  No results for input(s): NA, K, CL, GLUCOSE, BUN, CALCIUM, CREATININE in the last 72 hours.  Invalid input(s): CO3   Results for orders placed or performed during the hospital encounter of 04/27/21 (from the past 24 hour(s))  Glucose, capillary     Status: Abnormal   Collection Time: 04/27/21 12:32 PM  Result Value Ref Range   Glucose-Capillary 123 (H) 70 - 99 mg/dL   Comment 1 Notify RN    Recent Results (from the past 240 hour(s))  SARS CORONAVIRUS 2 (TAT 6-24 HRS) Nasopharyngeal Nasopharyngeal Swab     Status: None   Collection Time: 04/25/21  3:15 PM   Specimen: Nasopharyngeal Swab  Result Value Ref Range Status   SARS Coronavirus 2 NEGATIVE NEGATIVE Final    Comment: (NOTE) SARS-CoV-2 target nucleic acids are NOT DETECTED.  The SARS-CoV-2 RNA is generally detectable in upper and lower respiratory specimens during the acute phase of infection. Negative results do not preclude SARS-CoV-2 infection, do not rule out co-infections with other pathogens, and should not be used as the sole basis for treatment or other patient management decisions. Negative results must be combined with clinical observations, patient history, and epidemiological information. The expected result is Negative.  Fact Sheet for Patients: SugarRoll.be  Fact Sheet for Healthcare Providers: https://www.woods-mathews.com/  This test is not yet approved or cleared by the [place] FDA and  has been authorized for detection and/or  diagnosis of SARS-CoV-2 by FDA under an Emergency Use Authorization (EUA). This EUA will remain  in effect (meaning this test can be used) for the duration of the COVID-19 declaration under Se ction 564(b)(1) of the Act, 21 U.S.C. section 360bbb-3(b)(1), unless the authorization is terminated or revoked sooner.  Performed at [facility], [place] [address], [place], [place] [address]     Renal Function: No results for input(s): CREATININE in the last 168 hours. Estimated Creatinine Clearance: 93.5 mL/min (by C-G formula based on SCr of 1.13 mg/dL).  Radiologic Imaging: No results found.  I independently reviewed the above imaging studies.  Assessment and Plan Adam Montanye is a 60 y.o. male with cysto, R RPG, R URS/LL, R stent. He denies fevers or chills. Ucx 6/23 NG.  -The risks, benefits and alternatives of sto, R RPG, R URS/LL, R stent were discussed with the patient.  Risks include, but are not limited to: bleeding, urinary tract infection, ureteral injury, ureteral stricture disease, chronic pain, urinary symptoms, bladder injury, stent migration, the need for nephrostomy tube placement, MI, CVA, DVT, PE and the inherent risks with general anesthesia.  The patient voices understanding and wishes to proceed.   Matt R. Harutyun Monteverde MD 04/27/2021, 2:25 PM  [facility] Pager: [phone]): [phone]

## 2021-04-27 NOTE — Anesthesia Preprocedure Evaluation (Addendum)
Anesthesia Evaluation  Patient identified by MRN, date of birth, ID band Patient awake    Reviewed: Allergy & Precautions, NPO status , Patient's Chart, lab work & pertinent test results, reviewed documented beta blocker date and time   History of Anesthesia Complications (+) PONV and history of anesthetic complications  Airway Mallampati: II  TM Distance: >3 FB Neck ROM: Full    Dental no notable dental hx. (+) Teeth Intact, Dental Advisory Given, Caps,    Pulmonary sleep apnea and Continuous Positive Airway Pressure Ventilation ,    Pulmonary exam normal breath sounds clear to auscultation       Cardiovascular hypertension, Pt. on medications Normal cardiovascular exam Rhythm:Regular Rate:Normal     Neuro/Psych  Headaches, PSYCHIATRIC DISORDERS Anxiety Supraorbital neuralgia- gabapentin    GI/Hepatic negative GI ROS, Neg liver ROS,   Endo/Other  diabetes, Well Controlled, Type 2, Oral Hypoglycemic AgentsMorbid obesityHyperlipidemia  Renal/GU Renal diseaseNephrolithiasis Acute pyelonephritis  negative genitourinary   Musculoskeletal negative musculoskeletal ROS (+)   Abdominal (+) + obese,   Peds  Hematology negative hematology ROS (+)   Anesthesia Other Findings   Reproductive/Obstetrics negative OB ROS                            Anesthesia Physical  Anesthesia Plan  ASA: 3  Anesthesia Plan: General   Post-op Pain Management:    Induction: Intravenous  PONV Risk Score and Plan: 3 and Ondansetron, Dexamethasone, Midazolam, Treatment may vary due to age or medical condition and Aprepitant  Airway Management Planned: LMA  Additional Equipment: None  Intra-op Plan:   Post-operative Plan: Extubation in OR  Informed Consent: I have reviewed the patients History and Physical, chart, labs and discussed the procedure including the risks, benefits and alternatives for the proposed  anesthesia with the patient or authorized representative who has indicated his/her understanding and acceptance.     Dental advisory given  Plan Discussed with: CRNA and Anesthesiologist  Anesthesia Plan Comments:        Anesthesia Quick Evaluation

## 2021-04-27 NOTE — Anesthesia Postprocedure Evaluation (Signed)
Anesthesia Post Note  Patient: Mali Waldo  Procedure(s) Performed: CYSTOSCOPY/RETROGRADE/URETEROSCOPY/HOLMIUM LASER/STENT PLACEMENT (Right)     Patient location during evaluation: PACU Anesthesia Type: General Level of consciousness: awake and alert and oriented Pain management: pain level controlled Vital Signs Assessment: post-procedure vital signs reviewed and stable Respiratory status: spontaneous breathing, nonlabored ventilation and respiratory function stable Cardiovascular status: blood pressure returned to baseline and stable Postop Assessment: no apparent nausea or vomiting Anesthetic complications: no   No notable events documented.  Last Vitals:  Vitals:   04/27/21 1230 04/27/21 1700  BP: (!) 155/90 (!) 147/79  Pulse: 74 85  Resp: 18 17  Temp: 36.4 C 37.3 C  SpO2: 98% 95%    Last Pain:  Vitals:   04/27/21 1304  TempSrc:   PainSc: 0-No pain                 Sloka Volante A.

## 2021-04-27 NOTE — Discharge Instructions (Signed)
[facility] [phone] Post Ureteroscopy With or Without Stent Instructions  Definitions:  Ureter: The duct that transports urine from the kidney to the bladder. Stent:   A plastic hollow tube that is placed into the ureter, from the kidney to the bladder to prevent the ureter from swelling shut.  GENERAL INSTRUCTIONS:  Despite the fact that no skin incisions were used, the area around the ureter and bladder is raw and irritated. The stent is a foreign body which will further irritate the bladder wall. This irritation is manifested by increased frequency of urination, both day and night, and by an increase in the urge to urinate. In some, the urge to urinate is present almost always. Sometimes the urge is strong enough that you may not be able to stop yourself from urinating. The only real cure is to remove the stent and then give time for the bladder wall to heal which can't be done until the danger of the ureter swelling shut has passed, which varies.  You may see some blood in your urine while the stent is in place and a few days afterwards. Do not be alarmed, even if the urine was clear for a while. Get off your feet and drink lots of fluids until clearing occurs. If you start to pass clots or don't improve, call us.  DIET: You may return to your normal diet immediately. Because of the raw surface of your bladder, alcohol, spicy foods, acid type foods and drinks with caffeine may cause irritation or frequency and should be used in moderation. To keep your urine flowing freely and to avoid constipation, drink plenty of fluids during the day ( 8-10 glasses ). Tip: Avoid cranberry juice because it is very acidic.  ACTIVITY: Your physical activity doesn't need to be restricted. However, if you are very active, you may see some blood in your urine. We suggest that you reduce your activity under these circumstances until the bleeding has stopped.  BOWELS: It is important to  keep your bowels regular during the postoperative period. Straining with bowel movements can cause bleeding. A bowel movement every other day is reasonable. Use a mild laxative if needed, such as Milk of Magnesia 2-3 tablespoons, or 2 Dulcolax tablets. Call if you continue to have problems. If you have been taking narcotics for pain, before, during or after your surgery, you may be constipated. Take a laxative if necessary.   MEDICATION: You should resume your pre-surgery medications unless told not to. In addition you will often be given an antibiotic to prevent infection. These should be taken as prescribed until the bottles are finished unless you are having an unusual reaction to one of the drugs.  PROBLEMS YOU SHOULD REPORT TO [place]: Fevers over 100.5 Fahrenheit. Heavy bleeding, or clots ( See above notes about blood in urine ). Inability to urinate. Drug reactions ( hives, rash, nausea, vomiting, diarrhea ). Severe burning or pain with urination that is not improving.  FOLLOW-UP: You will need a follow-up appointment to monitor your progress. Call for this appointment at the number listed above. Usually the first appointment will be about three to fourteen days after your surgery.  F/u on 10/25 for stent removal in the office.

## 2021-04-27 NOTE — Op Note (Signed)
Operative Note  Preoperative diagnosis:  1.  Right renal stones  Postoperative diagnosis: 1.  Right renal stones  Procedure(s): 1.  Cystoscopy 2. Right ureteroscopy with laser lithotripsy and basket extraction of stones 3. Right retrograde pyelogram 4. Right ureteral stent placement 5. Fluoroscopy with intraoperative interpretation  Surgeon: Rexene Alberts, MD  Assistants:  None  Anesthesia:  General  Complications:  None  EBL:  Minimal  Specimens: 1. Stones for stone analysis (to be done at [facility])  Drains/Catheters: 1.  Right 6Fr x 24cm ureteral stent without a tether string  Intraoperative findings:   Cystoscopy demonstrated mild fossa navicularis stricture, moderate benign prostatic hypertrophy.  There were no suspicious bladder lesions.  His bilateral ureteral orifices were in their NORMAL orthotopic position. Right ureteroscopy demonstrated right proximal ureteral stricture that was larger in caliber after passive stent dilation and was dilated with the access sheath with a small stretch injury. Final retrograde pyelogram demonstrated no extravasation of contrast. Successful right ureteral stent placement.  Indication:  Adam Ford is a 60 y.o. male with a long history of bilateral urolithiasis.  He has had multiple episodes of medical expulsive therapy, ESWL and ureteroscopy.  He has had no prior PCNL. recent CT A/P 12/16/2020 demonstrated bilateral nonobstructing renal calculi. My read: Left lower pole: 1 cm x 5 mm stone. Right lower pole: 8 mm x 8 mm, 1.2 cm x 1.4 cm and a right lower pole calyx that is slightly more superior. 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 Both of the stones on the right are in the lower pole moiety.  He was brought to the operating room on 03/05/21 with inability to access his right proximal ureter given a proximal ureteral stricture. A stent was placed and he is being brought back today for repeat  ureteroscopy. After thorough discussion including all relevant risk benefits alternatives, he presents the operating today for this procedure.  Description of procedure: After informed consent was obtained from the patient, the patient was identified and taken to the operating room and placed in the supine position.  General anesthesia was administered as well as perioperative IV antibiotics.  At the beginning of the case, a time-out was performed to properly identify the patient, the surgery to be performed, and the surgical site.  Sequential compression devices were applied to the lower extremities at the beginning of the case for DVT prophylaxis.  The patient was then placed in the dorsal lithotomy supine position, prepped and draped in sterile fashion.  Preliminary scout fluoroscopy revealed that there was a 1 cm calcification area at the right upper pole of the lower pole moiety and a 87mm of the right lower pole, which corresponds to the stones found on the preoperative CT scan. We then passed the 21-French rigid cystoscope through the urethra and into the bladder under vision without any difficulty, noting a normal urethra without strictures and a mildly obstructing prostate.  A systematic evaluation of the bladder revealed no evidence of any suspicious bladder lesions.  Ureteral orifices were in normal position.    The distal aspect of the ureteral stent was seen protruding from the right ureteral orifice.  We then used the alligator-tooth forceps and grasped the distal end of the ureteral stent and brought it out the urethral meatus while watching the proximal coil straighten out nicely on fluoroscopy. Through the ureteral stent, we then passed a 0.038 sensor wire up to the level of the renal pelvis.  The ureteral  stent was then removed, leaving the sensor wire up the right ureter.    Under cystoscopic and flouroscopic guidance, we cannulated the right ureteral orifice with a 5-French open-ended  ureteral catheter and a gentle retrograde pyelogram was performed, revealing a normal caliber ureter without any filling defects. There was no hydronephrosis of the collecting system. A 0.038 sensor wire was then passed up to the level of the renal pelvis and secured to the drape as a safety wire. The ureteral catheter and cystoscope were removed, leaving the safety wire in place.   A semi-rigid ureteroscope was passed alongside the wire up the distal ureter which appeared normal. A second 0.038 sensor wire was passed under direct vision and the semirigid scope was removed. A 12/14Fr ureteral access sheath was carefully advanced up the ureter to the level of the UPJ over this wire under fluoroscopic guidance. The flexible ureteroscope was advanced into the collecting system via the access sheath. The collecting system was inspected. The calculus was identified at the right upper pole and lowe pole. Using the 242 micron holmium laser fiber, the stone was fragmented completely. A 2.2 Fr zero tip basket was used to remove the fragments under visual guidance. These were sent for chemical analysis. With the ureteroscope in the kidney, a gentle pyelogram was performed to delineate the calyceal system and we evaluated the calyces systematically. We encountered a no further large stone burden. The rest of the stone fragments were very tiny and these were  irrigated away gently. The calyces were re-inspected and there were no significant stone fragment residual.   We then withdrew the ureteroscope back down the ureter along with the access sheath, noting no evidence of any stones along the course of the ureter. There was a small stretch injury at the right proximal ureter after placing the access sheath. There was no extravasation of contrast at this area. This corresponded to the site of the prior proximal right ureteral stricture. Prior to removing the ureteroscope, we did pass the Glidewire back up to the ureter to  the renal pelvis.  Once the ureteroscope was removed, the Glidewire was backloaded through the rigid cystoscope, which was then advanced down the urethra and into the bladder. We then used the Glidewire under direct vision through the rigid cystoscope and under fluoroscopic guidance and passed up a 6-French, 24 cm double-pigtail ureteral stent up ureter, making sure that the proximal and distal ends coiled within the kidney and bladder respectively.  Note that we did not leave a tether string. The cystoscope was then advanced back into the bladder under vision.  We were able to see the distal stent coiling nicely within the bladder.  The bladder was then emptied with irrigation solution.  The cystoscope was then removed.    The patient tolerated the procedure well and there was no complication. Patient was awoken from anesthesia and taken to the recovery room in stable condition. I was present and scrubbed for the entirety of the case.  Plan:  Patient will be discharged home.  Follow up with me in 7 to 10 days for stent removal in the office.  [facility]  Pager: [phone]

## 2021-04-27 NOTE — Anesthesia Procedure Notes (Signed)
Procedure Name: LMA Insertion Date/Time: 04/27/2021 2:56 PM Performed by: Cynda Familia, CRNA Pre-anesthesia Checklist: Patient identified, Emergency Drugs available, Suction available and Patient being monitored Patient Re-evaluated:Patient Re-evaluated prior to induction Oxygen Delivery Method: Circle System Utilized Preoxygenation: Pre-oxygenation with 100% oxygen Induction Type: IV induction Ventilation: Mask ventilation without difficulty LMA: LMA inserted and LMA with gastric port inserted LMA Size: 4.0 Number of attempts: 1 Placement Confirmation: positive ETCO2 Tube secured with: Tape Dental Injury: Teeth and Oropharynx as per pre-operative assessment  Comments: Smooth IV induction Foster- intubation AM CRNA atraumatic- teeth and mouth as preop - bilat BS Royce Macadamia

## 2021-04-27 NOTE — Transfer of Care (Signed)
Immediate Anesthesia Transfer of Care Note  Patient: Adam Ford  Procedure(s) Performed: CYSTOSCOPY/RETROGRADE/URETEROSCOPY/HOLMIUM LASER/STENT PLACEMENT (Right)  Patient Location: PACU  Anesthesia Type:General  Level of Consciousness: awake, alert  and oriented  Airway & Oxygen Therapy: Patient Spontanous Breathing and Patient connected to face mask  Post-op Assessment: Report given to RN and Post -op Vital signs reviewed and stable  Post vital signs: Reviewed and stable  Last Vitals:  Vitals Value Taken Time  BP    Temp    Pulse 95 04/27/21 1655  Resp 15 04/27/21 1655  SpO2 92 % 04/27/21 1655  Vitals shown include unvalidated device data.  Last Pain:  Vitals:   04/27/21 1304  TempSrc:   PainSc: 0-No pain         Complications: No notable events documented.

## 2021-04-28 ENCOUNTER — Encounter (HOSPITAL_COMMUNITY): Payer: Self-pay | Admitting: Urology

## 2021-05-15 ENCOUNTER — Telehealth: Payer: Self-pay | Admitting: *Deleted

## 2021-05-15 NOTE — Telephone Encounter (Signed)
Mr Pacey called about his appt 06/06/21 with Dr [name].  He is taking gabapentin and is asking if he needs to stop this medication before his appointment.  Please advise.  His # is [phone].

## 2021-05-16 ENCOUNTER — Encounter: Payer: Self-pay | Admitting: Psychology

## 2021-05-16 NOTE — Telephone Encounter (Signed)
Per Dr [name] he does not need to stop medication. He has been notified.

## 2021-05-21 ENCOUNTER — Other Ambulatory Visit: Payer: Self-pay | Admitting: Urology

## 2021-05-22 NOTE — Patient Instructions (Addendum)
DUE TO COVID-19 ONLY ONE VISITOR IS ALLOWED TO COME WITH YOU AND STAY IN THE WAITING ROOM ONLY DURING PRE OP AND PROCEDURE.   **NO VISITORS ARE ALLOWED IN THE SHORT STAY AREA OR RECOVERY ROOM!!**       Your procedure is scheduled on: 06/04/21   Report to [facility] Main  Entrance    Report to admitting at 2:00 PM   Call this number if you have problems the morning of surgery [phone]   Do not eat food :After Midnight.   May have liquids until  1:00 PM day of surgery  CLEAR LIQUID DIET  Foods Allowed                                                                     Foods Excluded  Water, Black Coffee and tea, regular and decaf               liquids that you cannot  Plain Jell-O in any flavor  (No red)                                    see through such as: Fruit ices (not with fruit pulp)                                            milk, soups, orange juice              Iced Popsicles (No red)                                                All solid food                                   Apple juices Sports drinks like Gatorade (No red) Lightly seasoned clear broth or consume(fat free) Sugar, honey syrup   Oral Hygiene is also important to reduce your risk of infection.                                    Remember - BRUSH YOUR TEETH THE MORNING OF SURGERY WITH YOUR REGULAR TOOTHPASTE   Take these medicines the morning of surgery with A SIP OF WATER: Gabapentin, Metoprolol Succinate, Tamsulosin.  DO NOT TAKE ANY ORAL DIABETIC MEDICATIONS DAY OF YOUR SURGERY  How to Manage Your Diabetes Before and After Surgery  Why is it important to control my blood sugar before and after surgery? Improving blood sugar levels before and after surgery helps healing and can limit problems. A way of improving blood sugar control is eating a healthy diet by:  Eating less sugar and carbohydrates  Increasing activity/exercise  Talking with your doctor about reaching your blood sugar  goals High blood sugars (greater than 180 mg/dL) can raise your  risk of infections and slow your recovery, so you will need to focus on controlling your diabetes during the weeks before surgery. Make sure that the doctor who takes care of your diabetes knows about your planned surgery including the date and location.  How do I manage my blood sugar before surgery? Check your blood sugar at least 4 times a day, starting 2 days before surgery, to make sure that the level is not too high or low. Check your blood sugar the morning of your surgery when you wake up and every 2 hours until you get to the Short Stay unit. If your blood sugar is less than 70 mg/dL, you will need to treat for low blood sugar: Do not take insulin. Treat a low blood sugar (less than 70 mg/dL) with  cup of clear juice (cranberry or apple), 4 glucose tablets, OR glucose gel. Recheck blood sugar in 15 minutes after treatment (to make sure it is greater than 70 mg/dL). If your blood sugar is not greater than 70 mg/dL on recheck, call [phone] for further instructions. Report your blood sugar to the short stay nurse when you get to Short Stay.  If you are admitted to the hospital after surgery: Your blood sugar will be checked by the staff and you will probably be given insulin after surgery (instead of oral diabetes medicines) to make sure you have good blood sugar levels. The goal for blood sugar control after surgery is 80-180 mg/dL.   WHAT DO I DO ABOUT MY DIABETES MEDICATION?  Do not take oral diabetes medicines (pills) the morning of surgery.  THE NIGHT BEFORE SURGERY, take Metformin as prescribed      THE MORNING OF SURGERY, do not take Metformin  Reviewed and Endorsed by [facility] Patient Education Committee, August 2015                               You may not have any metal on your body including jewelry, and body piercing             Do not wear lotions, powders, cologne, or deodorant               Men may shave face and neck.   Do not bring valuables to the hospital. [place].   Contacts, dentures or bridgework may not be worn into surgery.    Patients discharged the day of surgery will not be allowed to drive home.  Special Instructions: Bring a copy of your healthcare power of attorney and living will documents         the day of surgery if you haven't scanned them in before.   Please read over the following fact sheets you were given: IF YOU HAVE QUESTIONS ABOUT YOUR PRE OP INSTRUCTIONS PLEASE CALL [phone]- [facility] - Preparing for Surgery Before surgery, you can play an important role.  Because skin is not sterile, your skin needs to be as free of germs as possible.  You can reduce the number of germs on your skin by washing with CHG (chlorahexidine gluconate) soap before surgery.  CHG is an antiseptic cleaner which kills germs and bonds with the skin to continue killing germs even after washing. Please DO NOT use if you have an allergy to CHG or antibacterial soaps.  If your skin becomes reddened/irritated stop using the CHG and inform your nurse when you arrive at Short Stay. Do not shave (including legs and underarms) for at least 48 hours prior to the first CHG shower.  You may shave your face/neck.  Please follow these instructions carefully:  1.  Shower with CHG Soap the night before surgery and the  morning of surgery.  2.  If you choose to wash your hair, wash your hair first as usual with your normal  shampoo.  3.  After you shampoo, rinse your hair and body thoroughly to remove the shampoo.                             4.  Use CHG as you would any other liquid soap.  You can apply chg directly to the skin and wash.  Gently with a scrungie or clean washcloth.  5.  Apply the CHG Soap to your body ONLY FROM THE NECK DOWN.   Do   not use on face/ open                           Wound or open sores. Avoid contact  with eyes, ears mouth and   genitals (private parts).                       Wash face,  Genitals (private parts) with your normal soap.             6.  Wash thoroughly, paying special attention to the area where your    surgery  will be performed.  7.  Thoroughly rinse your body with warm water from the neck down.  8.  DO NOT shower/wash with your normal soap after using and rinsing off the CHG Soap.                9.  Pat yourself dry with a clean towel.            10.  Wear clean pajamas.            11.  Place clean sheets on your bed the night of your first shower and do not  sleep with pets. Day of Surgery : Do not apply any lotions/deodorants the morning of surgery.  Please wear clean clothes to the hospital/surgery center.  FAILURE TO FOLLOW THESE INSTRUCTIONS MAY RESULT IN THE CANCELLATION OF YOUR SURGERY  PATIENT SIGNATURE_________________________________  NURSE SIGNATURE__________________________________  ________________________________________________________________________

## 2021-05-22 NOTE — Progress Notes (Addendum)
COVID Vaccine Completed: yes x4 Date COVID Vaccine completed: 12/21/19, 01/11/20 Has received booster:07/29/20, 05/19/21 COVID vaccine manufacturer: [place]   Date of COVID positive in last 90 days: No  PCP - Donald Prose, MD Cardiologist - N/a  Chest x-ray - 12/19/20 Epic EKG - 12/16/20 Epic Stress Test - yes 6 + years ago per pt ECHO - N/a Cardiac Cath - N/a Pacemaker/ICD device last checked: N/a Spinal Cord Stimulator: N/a  Sleep Study - yes 2019 CPAP - yes every night  Fasting Blood Sugar - greater than 120, pt cannot remember aexact numbers Checks Blood Sugar _rarely_ times a day  Blood Thinner Instructions: N/a Aspirin Instructions: Last Dose:  Activity level: Can go up a flight of stairs and perform activities of daily living without stopping and without symptoms of chest pain or shortness of breath.       Anesthesia review: HTN, DM, spesis  Patient denies shortness of breath, fever, cough and chest pain at PAT appointment   Patient verbalized understanding of instructions that were given to them at the PAT appointment. Patient was also instructed that they will need to review over the PAT instructions again at home before surgery.

## 2021-05-22 NOTE — Progress Notes (Signed)
Please place orders for PAT appointment scheduled 05/23/21.

## 2021-05-23 ENCOUNTER — Other Ambulatory Visit: Payer: Self-pay

## 2021-05-23 ENCOUNTER — Encounter (HOSPITAL_COMMUNITY)
Admission: RE | Admit: 2021-05-23 | Discharge: 2021-05-23 | Disposition: A | Discharge status: Home / Self Care | Payer: 59 | Source: Ambulatory Visit | Attending: Urology | Admitting: Urology

## 2021-05-23 ENCOUNTER — Encounter (HOSPITAL_COMMUNITY): Payer: Self-pay

## 2021-05-23 DIAGNOSIS — Z01812 Encounter for preprocedural laboratory examination: Secondary | ICD-10-CM | POA: Diagnosis not present

## 2021-05-23 HISTORY — DX: Pneumonia, unspecified organism: J18.9

## 2021-05-23 LAB — BASIC METABOLIC PANEL
Anion gap: 9 (ref 5–15)
BUN: 20 mg/dL (ref 6–20)
CO2: 29 mmol/L (ref 22–32)
Calcium: 9.5 mg/dL (ref 8.9–10.3)
Chloride: 102 mmol/L (ref 98–111)
Creatinine, Ser: 1.18 mg/dL (ref 0.61–1.24)
GFR, Estimated: >60 mL/min (ref >60)
Glucose, Bld: 133 mg/dL — ABNORMAL HIGH (ref 70–99)
Potassium: 3.6 mmol/L (ref 3.5–5.1)
Sodium: 140 mmol/L (ref 135–145)

## 2021-05-23 LAB — CBC
HCT: 46.3 % (ref 39.0–52.0)
Hemoglobin: 15.6 g/dL (ref 13.0–17.0)
MCH: 28.3 pg (ref 26.0–34.0)
MCHC: 33.7 g/dL (ref 30.0–36.0)
MCV: 83.9 fL (ref 80.0–100.0)
Platelets: 215 10*3/uL (ref 150–400)
RBC: 5.52 MIL/uL (ref 4.22–5.81)
RDW: 13.1 % (ref 11.5–15.5)
WBC: 5.6 10*3/uL (ref 4.0–10.5)
nRBC: 0 % (ref 0.0–0.2)

## 2021-05-23 LAB — GLUCOSE, CAPILLARY: Glucose-Capillary: 138 mg/dL — ABNORMAL HIGH (ref 70–99)

## 2021-05-29 ENCOUNTER — Other Ambulatory Visit: Payer: Self-pay

## 2021-05-29 ENCOUNTER — Encounter: Payer: 59 | Attending: Psychology | Admitting: Psychology

## 2021-05-29 DIAGNOSIS — R4189 Other symptoms and signs involving cognitive functions and awareness: Secondary | ICD-10-CM

## 2021-05-29 DIAGNOSIS — G500 Trigeminal neuralgia: Secondary | ICD-10-CM | POA: Insufficient documentation

## 2021-06-03 MED ORDER — GENTAMICIN SULFATE 40 MG/ML IJ SOLN
460.0000 mg | Freq: Once | INTRAVENOUS | Status: AC
  Administered 2021-06-04: 460 mg via INTRAVENOUS
  Filled 2021-06-03: qty 11.5

## 2021-06-04 ENCOUNTER — Encounter (HOSPITAL_COMMUNITY): Payer: Self-pay | Admitting: Urology

## 2021-06-04 ENCOUNTER — Ambulatory Visit (HOSPITAL_COMMUNITY)
Admission: RE | Admit: 2021-06-04 | Discharge: 2021-06-04 | Disposition: A | Discharge status: Home / Self Care | Payer: 59 | Source: Ambulatory Visit | Attending: Urology | Admitting: Urology

## 2021-06-04 ENCOUNTER — Ambulatory Visit (HOSPITAL_COMMUNITY): Payer: 59 | Admitting: Certified Registered Nurse Anesthetist

## 2021-06-04 ENCOUNTER — Encounter (HOSPITAL_COMMUNITY)
Admission: RE | Disposition: A | Discharge status: Home / Self Care | Payer: Self-pay | Source: Ambulatory Visit | Attending: Urology

## 2021-06-04 ENCOUNTER — Ambulatory Visit (HOSPITAL_COMMUNITY): Payer: 59

## 2021-06-04 ENCOUNTER — Ambulatory Visit (HOSPITAL_COMMUNITY): Payer: 59 | Admitting: Physician Assistant

## 2021-06-04 DIAGNOSIS — Z79899 Other long term (current) drug therapy: Secondary | ICD-10-CM | POA: Insufficient documentation

## 2021-06-04 DIAGNOSIS — R3912 Poor urinary stream: Secondary | ICD-10-CM | POA: Insufficient documentation

## 2021-06-04 DIAGNOSIS — R350 Frequency of micturition: Secondary | ICD-10-CM | POA: Insufficient documentation

## 2021-06-04 DIAGNOSIS — Z9049 Acquired absence of other specified parts of digestive tract: Secondary | ICD-10-CM | POA: Insufficient documentation

## 2021-06-04 DIAGNOSIS — E119 Type 2 diabetes mellitus without complications: Secondary | ICD-10-CM | POA: Diagnosis not present

## 2021-06-04 DIAGNOSIS — Z8249 Family history of ischemic heart disease and other diseases of the circulatory system: Secondary | ICD-10-CM | POA: Diagnosis not present

## 2021-06-04 DIAGNOSIS — N200 Calculus of kidney: Secondary | ICD-10-CM | POA: Diagnosis not present

## 2021-06-04 DIAGNOSIS — E669 Obesity, unspecified: Secondary | ICD-10-CM | POA: Diagnosis not present

## 2021-06-04 DIAGNOSIS — G4733 Obstructive sleep apnea (adult) (pediatric): Secondary | ICD-10-CM | POA: Diagnosis not present

## 2021-06-04 DIAGNOSIS — Z9852 Vasectomy status: Secondary | ICD-10-CM | POA: Insufficient documentation

## 2021-06-04 DIAGNOSIS — N401 Enlarged prostate with lower urinary tract symptoms: Secondary | ICD-10-CM | POA: Insufficient documentation

## 2021-06-04 DIAGNOSIS — E7800 Pure hypercholesterolemia, unspecified: Secondary | ICD-10-CM | POA: Diagnosis not present

## 2021-06-04 DIAGNOSIS — I10 Essential (primary) hypertension: Secondary | ICD-10-CM | POA: Insufficient documentation

## 2021-06-04 DIAGNOSIS — Z9884 Bariatric surgery status: Secondary | ICD-10-CM | POA: Diagnosis not present

## 2021-06-04 DIAGNOSIS — Z85828 Personal history of other malignant neoplasm of skin: Secondary | ICD-10-CM | POA: Diagnosis not present

## 2021-06-04 DIAGNOSIS — R3915 Urgency of urination: Secondary | ICD-10-CM | POA: Insufficient documentation

## 2021-06-04 DIAGNOSIS — R351 Nocturia: Secondary | ICD-10-CM | POA: Insufficient documentation

## 2021-06-04 HISTORY — PX: CYSTOSCOPY/URETEROSCOPY/HOLMIUM LASER/STENT PLACEMENT: SHX6546

## 2021-06-04 LAB — GLUCOSE, CAPILLARY
Glucose-Capillary: 101 mg/dL — ABNORMAL HIGH (ref 70–99)
Glucose-Capillary: 121 mg/dL — ABNORMAL HIGH (ref 70–99)

## 2021-06-04 SURGERY — CYSTOSCOPY/URETEROSCOPY/HOLMIUM LASER/STENT PLACEMENT
Anesthesia: General | Laterality: Left

## 2021-06-04 MED ORDER — SCOPOLAMINE 1 MG/3DAYS TD PT72
MEDICATED_PATCH | TRANSDERMAL | Status: DC | PRN
  Administered 2021-06-04: 1 via TRANSDERMAL

## 2021-06-04 MED ORDER — CEPHALEXIN 500 MG PO CAPS: 500.0000 mg | ORAL_CAPSULE | Freq: Two times a day (BID) | ORAL | 0 refills | Status: AC

## 2021-06-04 MED ORDER — PROPOFOL 10 MG/ML IV BOLUS
INTRAVENOUS | Status: AC
  Filled 2021-06-04: qty 20

## 2021-06-04 MED ORDER — OXYCODONE-ACETAMINOPHEN 5-325 MG PO TABS: 1.0000 | ORAL_TABLET | ORAL | 0 refills | Status: AC | PRN

## 2021-06-04 MED ORDER — PROMETHAZINE HCL 25 MG/ML IJ SOLN: 6.2500 mg | INTRAMUSCULAR | Status: DC | PRN

## 2021-06-04 MED ORDER — MIDAZOLAM HCL 2 MG/2ML IJ SOLN
INTRAMUSCULAR | Status: AC
  Filled 2021-06-04: qty 2

## 2021-06-04 MED ORDER — ACETAMINOPHEN 500 MG PO TABS
1000.0000 mg | ORAL_TABLET | Freq: Once | ORAL | Status: AC
  Administered 2021-06-04: 1000 mg via ORAL
  Filled 2021-06-04: qty 2

## 2021-06-04 MED ORDER — FENTANYL CITRATE (PF) 100 MCG/2ML IJ SOLN: 25.0000 ug | INTRAMUSCULAR | Status: DC | PRN

## 2021-06-04 MED ORDER — MIDAZOLAM HCL 5 MG/5ML IJ SOLN
INTRAMUSCULAR | Status: DC | PRN
  Administered 2021-06-04: 2 mg via INTRAVENOUS

## 2021-06-04 MED ORDER — CHLORHEXIDINE GLUCONATE 0.12 % MT SOLN
15.0000 mL | Freq: Once | OROMUCOSAL | Status: AC
  Administered 2021-06-04: 15 mL via OROMUCOSAL

## 2021-06-04 MED ORDER — FENTANYL CITRATE (PF) 100 MCG/2ML IJ SOLN
INTRAMUSCULAR | Status: AC
  Filled 2021-06-04: qty 2

## 2021-06-04 MED ORDER — PROPOFOL 10 MG/ML IV BOLUS
INTRAVENOUS | Status: DC | PRN
  Administered 2021-06-04: 170 mg via INTRAVENOUS

## 2021-06-04 MED ORDER — LIDOCAINE 2% (20 MG/ML) 5 ML SYRINGE
INTRAMUSCULAR | Status: DC | PRN
  Administered 2021-06-04: 100 mg via INTRAVENOUS

## 2021-06-04 MED ORDER — FENTANYL CITRATE (PF) 100 MCG/2ML IJ SOLN
INTRAMUSCULAR | Status: DC | PRN
  Administered 2021-06-04 (×4): 25 ug via INTRAVENOUS

## 2021-06-04 MED ORDER — SCOPOLAMINE 1 MG/3DAYS TD PT72
MEDICATED_PATCH | TRANSDERMAL | Status: AC
  Filled 2021-06-04: qty 1

## 2021-06-04 MED ORDER — AMISULPRIDE (ANTIEMETIC) 5 MG/2ML IV SOLN
10.0000 mg | Freq: Once | INTRAVENOUS | Status: DC | PRN
Start: 2021-06-04 — End: 2021-06-04

## 2021-06-04 MED ORDER — IOHEXOL 300 MG/ML  SOLN
INTRAMUSCULAR | Status: DC | PRN
  Administered 2021-06-04: 10 mL

## 2021-06-04 MED ORDER — ORAL CARE MOUTH RINSE: 15.0000 mL | Freq: Once | OROMUCOSAL | Status: AC

## 2021-06-04 MED ORDER — DOCUSATE SODIUM 100 MG PO CAPS: 100.0000 mg | ORAL_CAPSULE | Freq: Every day | ORAL | 0 refills | Status: AC | PRN

## 2021-06-04 MED ORDER — LACTATED RINGERS IV SOLN: INTRAVENOUS | Status: DC

## 2021-06-04 MED ORDER — LIDOCAINE 2% (20 MG/ML) 5 ML SYRINGE
INTRAMUSCULAR | Status: AC
  Filled 2021-06-04: qty 5

## 2021-06-04 MED ORDER — DEXAMETHASONE SODIUM PHOSPHATE 10 MG/ML IJ SOLN
INTRAMUSCULAR | Status: DC | PRN
  Administered 2021-06-04: 10 mg via INTRAVENOUS

## 2021-06-04 MED ORDER — SODIUM CHLORIDE 0.9 % IR SOLN
Status: DC | PRN
  Administered 2021-06-04: 3000 mL via INTRAVESICAL

## 2021-06-04 MED ORDER — CELECOXIB 200 MG PO CAPS
200.0000 mg | ORAL_CAPSULE | Freq: Once | ORAL | Status: AC
  Administered 2021-06-04: 200 mg via ORAL
  Filled 2021-06-04: qty 1

## 2021-06-04 SURGICAL SUPPLY — 22 items
BAG URO CATCHER STRL LF (MISCELLANEOUS) ×2 IMPLANT
BASKET ZERO TIP NITINOL 2.4FR (BASKET) ×2 IMPLANT
CATH URET 5FR 28IN OPEN ENDED (CATHETERS) ×2 IMPLANT
CLOTH BEACON ORANGE TIMEOUT ST (SAFETY) ×2 IMPLANT
FIBER LASER MOSES 200 DFL (Laser) ×2 IMPLANT
GLOVE SURG ENC TEXT LTX SZ7 (GLOVE) ×2 IMPLANT
GOWN STRL REUS W/TWL LRG LVL3 (GOWN DISPOSABLE) ×2 IMPLANT
GUIDEWIRE STR DUAL SENSOR (WIRE) ×4 IMPLANT
GUIDEWIRE ZIPWRE .038 STRAIGHT (WIRE) IMPLANT
IV NS 1000ML (IV SOLUTION) ×1
IV NS 1000ML BAXH (IV SOLUTION) ×1 IMPLANT
KIT TURNOVER KIT A (KITS) ×2 IMPLANT
LASER FIB FLEXIVA PULSE ID 365 (Laser) IMPLANT
MANIFOLD NEPTUNE II (INSTRUMENTS) ×2 IMPLANT
PACK CYSTO (CUSTOM PROCEDURE TRAY) ×2 IMPLANT
SHEATH URETERAL 12FR 45CM (SHEATH) ×2 IMPLANT
SHEATH URETERAL 12FRX35CM (MISCELLANEOUS) IMPLANT
STENT URET 6FRX26 CONTOUR (STENTS) ×2 IMPLANT
TRACTIP FLEXIVA PULS ID 200XHI (Laser) IMPLANT
TRACTIP FLEXIVA PULSE ID 200 (Laser)
TUBING CONNECTING 10 (TUBING) ×2 IMPLANT
TUBING UROLOGY SET (TUBING) ×2 IMPLANT

## 2021-06-04 NOTE — Anesthesia Preprocedure Evaluation (Addendum)
Anesthesia Evaluation  Patient identified by MRN, date of birth, ID band Patient awake    Reviewed: Allergy & Precautions, NPO status , Patient's Chart, lab work & pertinent test results  History of Anesthesia Complications (+) PONV and history of anesthetic complications  Airway Mallampati: II  TM Distance: >3 FB Neck ROM: Full    Dental no notable dental hx. (+) Dental Advisory Given, Missing,    Pulmonary sleep apnea and Continuous Positive Airway Pressure Ventilation ,    Pulmonary exam normal        Cardiovascular hypertension, Pt. on medications Normal cardiovascular exam     Neuro/Psych  Headaches, PSYCHIATRIC DISORDERS Anxiety Supraorbital neuralgia- gabapentin    GI/Hepatic negative GI ROS, Neg liver ROS,   Endo/Other  diabetes, Well Controlled, Type 2, Oral Hypoglycemic AgentsMorbid obesityHyperlipidemia  Renal/GU Renal diseaseNephrolithiasis Acute pyelonephritis  negative genitourinary   Musculoskeletal negative musculoskeletal ROS (+)   Abdominal (+) + obese,   Peds  Hematology negative hematology ROS (+)   Anesthesia Other Findings   Reproductive/Obstetrics negative OB ROS                            Anesthesia Physical  Anesthesia Plan  ASA: 3  Anesthesia Plan: General   Post-op Pain Management:    Induction: Intravenous  PONV Risk Score and Plan: 4 or greater and Ondansetron, Dexamethasone, Midazolam, Treatment may vary due to age or medical condition and Scopolamine patch - Pre-op  Airway Management Planned: LMA  Additional Equipment: None  Intra-op Plan:   Post-operative Plan: Extubation in OR  Informed Consent: I have reviewed the patients History and Physical, chart, labs and discussed the procedure including the risks, benefits and alternatives for the proposed anesthesia with the patient or authorized representative who has indicated his/her understanding  and acceptance.     Dental advisory given  Plan Discussed with: Anesthesiologist and CRNA  Anesthesia Plan Comments:        Anesthesia Quick Evaluation

## 2021-06-04 NOTE — Discharge Instructions (Signed)

## 2021-06-04 NOTE — Anesthesia Procedure Notes (Signed)
Procedure Name: LMA Insertion Date/Time: 06/04/2021 3:53 PM Performed by: Sharlette Dense, CRNA Patient Re-evaluated:Patient Re-evaluated prior to induction Oxygen Delivery Method: Circle system utilized Preoxygenation: Pre-oxygenation with 100% oxygen Induction Type: IV induction LMA: LMA with gastric port inserted LMA Size: 4.0 Number of attempts: 1 Placement Confirmation: positive ETCO2 and breath sounds checked- equal and bilateral Tube secured with: Tape Dental Injury: Teeth and Oropharynx as per pre-operative assessment

## 2021-06-04 NOTE — Op Note (Signed)
Operative Note  Preoperative diagnosis:  1.  Left renal stone  Postoperative diagnosis: 1.  Left renal stone  Procedure(s): 1.  Cystoscopy 2. Left ureteroscopy with laser lithotripsy and basket extraction of stones 3. Left retrograde pyelogram 4. Left ureteral stent placement 5. Fluoroscopy with intraoperative interpretation  Surgeon: Rexene Alberts, MD  Assistants:  None  Anesthesia:  General  Complications:  None  EBL:  Minimal  Specimens: 1. Stones for stone analysis (to be done at [facility])  Drains/Catheters: 1.  Left 6Fr x 26cm ureteral stent without a tether string  Intraoperative findings:   Cystoscopy demonstrated no suspicious bladder lesions Left ureteroscopy demonstrated 1cm left lower pole stone. Left retrograde pyelogram demonstrated no hydronephrosis. Successful left ureteral stent placement.  Indication:  Adam Ford is a 60 y.o. male with a 1cm left lower pole stone here for definitive stone analysis.  Description of procedure: After informed consent was obtained from the patient, the patient was identified and taken to the operating room and placed in the supine position.  General anesthesia was administered as well as perioperative IV antibiotics.  At the beginning of the case, a time-out was performed to properly identify the patient, the surgery to be performed, and the surgical site.  Sequential compression devices were applied to the lower extremities at the beginning of the case for DVT prophylaxis.  The patient was then placed in the dorsal lithotomy supine position, prepped and draped in sterile fashion.  Preliminary scout fluoroscopy revealed that there was a 1cm calcification area at the left lower pole, which corresponds to the stone found on the preoperative CT scan. We then passed the 21-French rigid cystoscope through the urethra and into the bladder under vision without any difficulty, noting a normal urethra without strictures and a  mildly obstructing prostate.  A systematic evaluation of the bladder revealed no evidence of any suspicious bladder lesions.  Ureteral orifices were in normal position.    Under cystoscopic and flouroscopic guidance, we cannulated the left ureteral orifice with a 5-French open-ended ureteral catheter and a gentle retrograde pyelogram was performed, revealing a normal caliber ureter without any filling defects. There was no hydronephrosis of the collecting system. There was a 1cm filling defect in the left lower pole corresponding to the stone. A 0.038 sensor wire was then passed up to the level of the renal pelvis and secured to the drape as a safety wire. The ureteral catheter and cystoscope were removed, leaving the safety wire in place.   A semi-rigid ureteroscope was passed alongside the wire up the distal ureter which appeared normal. A second 0.038 sensor wire was passed under direct vision and the semirigid scope was removed. The inner sheath and subsequently, a 12/14 Fr ureteral access sheath was carefully advanced up the ureter to the level of the UPJ over this wire under fluoroscopic guidance. The flexible ureteroscope was advanced into the collecting system via the access sheath. The collecting system was inspected. The calculus was identified at the left lower pole. Using the 200 micron holmium laser fiber, the stone was fragmented completely. A 2.2 Fr zero tip basket was used to remove the fragments under visual guidance. These were sent for chemical analysis. With the ureteroscope in the kidney, a gentle pyelogram was performed to delineate the calyceal system and we evaluated the calyces systematically. We encountered no further stones. The rest of the stone fragments were very tiny and these were  irrigated away gently. The calyces were re-inspected and there were no  significant stone fragment residual.   We then withdrew the ureteroscope back down the ureter along with the access sheath,  noting no evidence of any stones or trauma along the course of the ureter.  Prior to removing the ureteroscope, we did pass the Glidewire back up to the ureter to the renal pelvis.  Once the ureteroscope was removed, the Glidewire was backloaded through the rigid cystoscope, which was then advanced down the urethra and into the bladder. We then used the Glidewire under direct vision through the rigid cystoscope and under fluoroscopic guidance and passed up a 6-French, 26 cm double-pigtail ureteral stent up ureter, making sure that the proximal and distal ends coiled within the kidney and bladder respectively.  The cystoscope was then advanced back into the bladder under vision.  We were able to see the distal stent coiling nicely within the bladder.  The bladder was then emptied with irrigation solution.  The cystoscope was then removed.    The patient tolerated the procedure well and there was no complication. Patient was awoken from anesthesia and taken to the recovery room in stable condition. I was present and scrubbed for the entirety of the case.  Plan:  Patient will be discharged home.  Follow up with me in 7 to 10 days for stent removal in the office.   Matt R. [facility]  Pager: [phone]

## 2021-06-04 NOTE — Transfer of Care (Signed)
Immediate Anesthesia Transfer of Care Note  Patient: Adam Ford  Procedure(s) Performed: CYSTOSCOPY/RETROGRADE/URETEROSCOPY/HOLMIUM LASER/STENT PLACEMENT (Left)  Patient Location: PACU  Anesthesia Type:General  Level of Consciousness: drowsy  Airway & Oxygen Therapy: Patient Spontanous Breathing and Patient connected to face mask oxygen  Post-op Assessment: Report given to RN and Post -op Vital signs reviewed and stable  Post vital signs: Reviewed and stable  Last Vitals:  Vitals Value Taken Time  BP 134/80 06/04/21 1710  Temp    Pulse 64 06/04/21 1711  Resp 8 06/04/21 1711  SpO2 95 % 06/04/21 1711  Vitals shown include unvalidated device data.  Last Pain:  Vitals:   06/04/21 1300  TempSrc:   PainSc: 0-No pain      Patients Stated Pain Goal: 3 ([id number])  Complications: No notable events documented.

## 2021-06-04 NOTE — Anesthesia Postprocedure Evaluation (Signed)
Anesthesia Post Note  Patient: Adam Ford  Procedure(s) Performed: CYSTOSCOPY/RETROGRADE/URETEROSCOPY/HOLMIUM LASER/STENT PLACEMENT (Left)     Patient location during evaluation: PACU Anesthesia Type: General Level of consciousness: sedated Pain management: pain level controlled Vital Signs Assessment: post-procedure vital signs reviewed and stable Respiratory status: spontaneous breathing and respiratory function stable Cardiovascular status: stable Postop Assessment: no apparent nausea or vomiting Anesthetic complications: no   No notable events documented.  Last Vitals:  Vitals:   06/04/21 1745 06/04/21 1800  BP: (!) 138/93 (!) 144/76  Pulse: 65 (!) 57  Resp: 20 18  Temp:    SpO2: 94% 98%    Last Pain:  Vitals:   06/04/21 1800  TempSrc:   PainSc: 1                  Silver Achey DANIEL

## 2021-06-04 NOTE — H&P (Signed)
Office Visit Report 05/07/2021    Adam Ford         MRN: [id number] PRIMARY CARE:  Donald Prose   REFERRING:    DOB: 1961-04-24, 60 year old Male PROVIDER:  Rexene Alberts, M.D.  SSN:  LOCATION:  [facility] [phone]    CC/HPI: Adam Ford is a 60 year old male seen in follow-up today for history of bilateral nephrolithiasis.   1. Bilateral renal stones:  -CT A/P 12/16/2020 demonstrated bilateral nonobstructing renal calculi. My read: Left lower pole: 1 cm x 5 mm stone. Right lower pole: 8 mm x 8 mm, 1.2 cm x 1.4 cm and a right lower pole calyx that is slightly more superior. 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 Both of the stones on the right are in the lower pole moiety.  -S/p R URS on 03/05/21. He was found to have right proximal ureteral stricture and I was unable to gain access into his right lower pole moiety. I thus left a stent for passive dilation. S/p R URS/LL 04/27/21. R stent removed 7/25.   He would like to treat his left-sided renal stones. He denies any left-sided flank pain. He denies fevers, chills.   He does complain of some lower urinary tract symptoms. IPSS score is 6, quality of life 3. He does complain of frequency, intermittency, urgency and 2 time nocturia.   He does have past medical history of diabetes, hypertension, obesity.     ALLERGIES: None   MEDICATIONS: Ditropan Xl 10 mg tablet, extended release 24 hr 1 tablet PO Daily  Metformin Hcl  Metoprolol Succinate  Percocet 5 mg-325 mg tablet 1 tablet PO Q 4 H PRN  Tamsulosin Hcl 0.4 mg capsule 1 capsule PO Daily  Amlodipine Besylate  Dicyclomine Hcl 10 mg capsule  Gabapentin  Pyridium 100 mg tablet  Rosuvastatin Calcium  Uribel 118 mg-10 mg-40.8 mg-36 mg-0.12 mg capsule 1 capsule PO QID PRN  Zofran 4 mg tablet     GU PSH: Cystoscopy - 02/09/2020 Cystoscopy Insert Stent - 03/05/2021 Cystoscopy Ureteroscopy - 03/05/2021     NON-GU PSH:  Appendectomy Cholecystectomy (laparoscopic) Gastric sleeve Tonsillectomy         GU PMH: BPH w/LUTS - 04/05/2021, - 03/22/2021, - 01/25/2021 Renal calculus, Bilateral - 04/05/2021, Bilateral, - 03/22/2021, Bilateral, - [id number], Bilateral, 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 His stone burden on the right is in the lower moiety. He has bilateral non-obstructing stones. The 2 largest stones are identified in the mid pole and lower pole area of the right kidney. The mid pole stone measures approximately 12 mm, lower pole calculi measures between 7 and 8 mm respectively. Largest stone on the left identified in the left lower pole measures between 4 and 5 mm. These are grossly stable when compared to KUB imaging performed last year., - 01/05/2021, Bilateral, He has bilateral renal calculi. We discussed proceeding with metabolic evaluation however he was inclined not to proceed in that fashion but would rather continue to monitor his stones since he has not had a stone episode in 7 years. I will therefore have him return in 6 months for a KUB to assure stability., - 07/14/2019 Urinary Frequency - 04/05/2021, - 03/22/2021, - 01/25/2021 Weak Urinary Stream - 01/25/2021 Acute Cystitis/UTI - 01/05/2021 Microscopic hematuria - 02/03/2020 Abnormal radiologic findings on diagnostic imaging of other urinary organs, What is being seen on his CT scan appears to be his median lobe. I found no abnormality whatsoever  on his rectal exam. I will obtain a screening PSA. I have reassured him I feel that this is of no clinical significance. - 07/14/2019 BPH w/o LUTS, He does have some prostate enlargement. It is noted on his CT scan and on examination but does not have any significant voiding symptoms. - 07/14/2019    NON-GU PMH: Anxiety Diabetes Type 2 Hypercholesterolemia Hypertension Sleep Apnea    FAMILY HISTORY: Colon Cancer - Grandmother   SOCIAL HISTORY: Marital Status: Married Preferred Language: English; Ethnicity: Not  Hispanic Or Latino; Race: White Current Smoking Status: Patient has never smoked.  <DIV'  Tobacco Use Assessment Completed:  Used Tobacco in last 30 days?   Does not drink anymore.  Drinks 4+ caffeinated drinks per day.    REVIEW OF SYSTEMS:    GU Review Male:  Patient denies frequent urination, hard to postpone urination, burning/ pain with urination, get up at night to urinate, leakage of urine, stream starts and stops, trouble starting your stream, have to strain to urinate , erection problems, and penile pain.   Gastrointestinal (Upper):  Patient denies nausea, vomiting, and indigestion/ heartburn.   Gastrointestinal (Lower):  Patient denies diarrhea and constipation.   Constitutional:  Patient denies fever, night sweats, weight loss, and fatigue.   Skin:  Patient denies itching and skin rash/ lesion.   Eyes:  Patient denies blurred vision and double vision.   Ears/ Nose/ Throat:  Patient denies sore throat and sinus problems.   Hematologic/Lymphatic:  Patient denies swollen glands and easy bruising.   Cardiovascular:  Patient denies leg swelling and chest pains.   Respiratory:  Patient denies cough and shortness of breath.   Endocrine:  Patient denies excessive thirst.   Musculoskeletal:  Patient denies back pain and joint pain.   Neurological:  Patient denies headaches and dizziness.   Psychologic:  Patient denies depression and anxiety.   VITAL SIGNS: None   GU PHYSICAL EXAMINATION:    Anus and Perineum: No hemorrhoids. No anal stenosis. No rectal fissure, no anal fissure. No edema, no dimple, no perineal tenderness, no anal tenderness.   Scrotum: No lesions. No edema. No cysts. No warts.   Epididymides: Right: no spermatocele, no masses, no cysts, no tenderness, no induration, no enlargement. Left: no spermatocele, no masses, no cysts, no tenderness, no induration, no enlargement.   Testes: No tenderness, no swelling, no enlargement left testes. No tenderness, no swelling, no  enlargement right testes. Normal location left testes. Normal location right testes. No mass, no cyst, no varicocele, no hydrocele left testes. No mass, no cyst, no varicocele, no hydrocele right testes.   Urethral Meatus: Normal size. No lesion, no wart, no discharge, no polyp. Normal location.   Penis: Circumcised, no warts, no cracks. No dorsal Peyronie's plaques, no left corporal Peyronie's plaques, no right corporal Peyronie's plaques, no scarring, no warts. No balanitis, no meatal stenosis.   Prostate: 40 gram or 2+ size. Left lobe normal consistency, right lobe normal consistency. Symmetrical lobes. No prostate nodule. Left lobe no tenderness, right lobe no tenderness.   Seminal Vesicles: Nonpalpable.   Sphincter Tone: Normal sphincter. No rectal tenderness. No rectal mass.    MULTI-SYSTEM PHYSICAL EXAMINATION:    Constitutional: Well-nourished. No physical deformities. Normally developed. Good grooming.   Neck: Neck symmetrical, not swollen. Normal tracheal position.   Respiratory: No labored breathing, no use of accessory muscles.    Cardiovascular: Normal temperature, normal extremity pulses, no swelling, no varicosities.   Lymphatic: No enlargement of neck, axillae, groin.  Skin: No paleness, no jaundice, no cyanosis. No lesion, no ulcer, no rash.   Neurologic / Psychiatric: Oriented to time, oriented to place, oriented to person. No depression, no anxiety, no agitation.   Gastrointestinal: No mass, no tenderness, no rigidity, non obese abdomen.   Eyes: Normal conjunctivae. Normal eyelids.   Ears, Nose, Mouth, and Throat: Left ear no scars, no lesions, no masses. Right ear no scars, no lesions, no masses. Nose no scars, no lesions, no masses. Normal hearing. Normal lips.   Musculoskeletal: Normal gait and station of head and neck.        Complexity of Data:   Source Of History:  Patient, Medical Record Summary  Records Review:  AUA Symptom Score, Previous Doctor Records, Previous  Hospital Records  X-Ray Review: C.T. Abdomen/Pelvis: Reviewed Films. Reviewed Report. Discussed With Patient.      07/14/19  PSA  Total PSA 1.19 ng/mL    PROCEDURES:    Flexible Cystoscopy Right Stent Removal - 52310  Risks, benefits, and some of the potential complications of the procedure were discussed at length with the patient including infection, bleeding, voiding discomfort, urinary retention, fever, chills, sepsis, and others. All questions were answered. Informed consent was obtained. Antibiotic prophylaxis was given. Sterile technique and intraurethral analgesia were used.  Meatus:  Normal size. Normal location. Normal condition.  Urethra:  No strictures.  External Sphincter:  Normal.  Verumontanum:  Normal.  Prostate:  Non-obstructing. No hyperplasia.  Bladder Neck:  Non-obstructing.  Ureteral Orifices:  Normal location. Normal size. Normal shape. Effluxed clear urine.  Bladder:  No trabeculation. No tumors. Normal mucosa. No stones.  The right ureteral stent was carefully removed with a grasping instrument.    The lower urinary tract was carefully examined. The procedure was well-tolerated and without complications. Antibiotic instructions were given. Instructions were given to call the office immediately for bloody urine, difficulty urinating, urinary retention, painful or frequent urination, fever, chills, nausea, vomiting or other illness. The patient stated that he understood these instructions and would comply with them.    Urinalysis w/Scope - 81001  Dipstick Dipstick Cont'd Micro  Color: Yellow Bilirubin: Neg WBC/hpf: 6 - 10/hpf  Appearance: Slightly Cloudy Ketones: Neg RBC/hpf: >60/hpf  Specific Gravity: 1.025 Blood: 3+ Bacteria: NS (Not Seen)  pH: <=5.0 Protein: 2+ Cystals: NS (Not Seen)  Glucose: Neg Urobilinogen: 1.0 Casts: NS (Not Seen)   Nitrites: Neg Trichomonas: Not Present   Leukocyte Esterase: Trace Mucous: Not Present    Epithelial Cells: 6 - 10/hpf     Yeast: NS (Not Seen)    Sperm: Not Present   Notes:       ASSESSMENT:     ICD-10 Details  1 GU:  BPH w/LUTS - N40.1   2  Renal calculus - N20.0 Bilateral  3  Urinary Frequency - R35.0   4  Weak Urinary Stream - R39.12    PLAN:   Orders  Labs Hypercalciura Profile  Schedule  X-Rays: 1 Month - Renal Ultrasound. No Oral Contrast  Return Visit/Planned Activity: 1 Month - Office Visit  Document  Letter(s):  Created for Patient: Clinical Summary   Notes:  1. Left renal stones:  -CT A/P 12/16/2020 demonstrated bilateral nonobstructing renal calculi. My read: Left lower pole: 1 cm x 5 mm stone. Right lower pole: 8 mm x 8 mm, 1.2 cm x 1.4 cm and a right lower pole calyx that is slightly more superior. 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 Both  of the stones on the right are in the lower pole moiety.  -S/p R URS/LL on 04/27/2021. F/u in 1 mo with RUS. Ordered metabolic evaluation.  -I will arrange for left-sided ureteroscopy with laser lithotripsy and best extraction of stones.   We discussed the options for management of kidney stones, including observation, ESWL, ureteroscopy with laser lithotripsy, and PCNL. The risks and benefits of each option were discussed.  For observation I described the risks which include but are not limited to silent renal damage, life-threatening infection, need for emergent surgery, failure to pass stone, and pain.   ESWL: risks and benefits of ESWL were outlined including infection, bleeding, pain, steinstrasse, kidney injury, need for ancillary treatments, and global anesthesia risks including but not limited to CVA, MI, DVT, PE, pneumonia, and death.   Ureteroscopy: risks and benefits of ureteroscopy were outlined, including infection, bleeding, pain, temporary ureteral stent and associated stent bother, ureteral injury, ureteral stricture, need for ancillary treatments, and global anesthesia risks including but not limited to CVA,  MI, DVT, PE, pneumonia, and death.   PCNL: risks and benefits of PCNL were outlined including infection, bleeding, blood transfusion, pain, pneumothorax, bowel injury, persistent urine leak, positioning injury, inability to clear stone burden, renal laceration, arterial venous fistula or malformation, need for ancillary treatments, and global anesthesia risks including but not limited to CVA, MI, DVT, PE, pneumonia, and death.   2. BPH with LUTS: Mild symptoms. He does not wish to start medicine at this time.   CC: Donald Prose, MD    Urology Preoperative H&P   Chief Complaint: Left renal stone  History of Present Illness: Adam Ford is a 60 y.o. male with left renal stone here for cysto, L RPG, L URS/LL, L stent placement.    Past Medical History:  Diagnosis Date   Anxiety    Blepharitis, both eyes    Bulging of thoracic intervertebral disc    T8   Cancer (HCC)    basal cell nose   Glaucoma, both eyes    per pt no eye drops ,   Headache    with neuralgia   History of basal cell carcinoma (BCC) excision    s/p moh's surgery of nose in 2020   History of kidney stones    History of sepsis 12/16/2020   hospital admission in epic, secondary to UTI with ecoli and pyelonephritis   Hypercholesteremia    Hypertension    followed by pcp   OSA on CPAP    Pneumonia    PONV (postoperative nausea and vomiting)    Renal calculi    bilateral non-obstructive per abd imaging 02-19-2021   Supraorbital neuralgia    neurologist--- dr Jaynee Eagles----  mostly right side, takes gabapentin   Type 2 diabetes mellitus ([place])    followed by pcp  ---  (03-01-2021 pt stated checks blood sugar once wkly one hour after eats,  average 130--140)   Wears glasses     Past Surgical History:  Procedure Laterality Date   APPENDECTOMY  1969   CYST EXCISION Left    hip   CYSTOSCOPY/URETEROSCOPY/HOLMIUM LASER/STENT PLACEMENT  last one 2012 approx.   CYSTOSCOPY/URETEROSCOPY/HOLMIUM LASER/STENT PLACEMENT  Right 03/05/2021   Procedure: CYSTOSCOPY/RETROGRADE/URETEROSCOPY/STENT PLACEMENT;  Surgeon: Janith Lima, MD;  Location: [facility];  Service: Urology;  Laterality: Right;   CYSTOSCOPY/URETEROSCOPY/HOLMIUM LASER/STENT PLACEMENT Right 04/27/2021   Procedure: CYSTOSCOPY/RETROGRADE/URETEROSCOPY/HOLMIUM LASER/STENT PLACEMENT;  Surgeon: Janith Lima, MD;  Location: [facility];  Service: Urology;  Laterality: Right;  EXTRACORPOREAL SHOCK WAVE LITHOTRIPSY  last one 2012  approx.   GASTRIC SLEEVE  2017 approx   LAPAROSCOPIC CHOLECYSTECTOMY  2008 approx   lasers yag iridotomy Bilateral    LIPOMA EXCISION Left 2020   Ear Dr. Redmond Baseman   MOHS SURGERY  2020   nose   NASAL TURBINATE REDUCTION  2018   NASAL VALVE COLLAPSE REPAIR  2019   TONSILLECTOMY  child   TRABECULECTOMY Bilateral 2019   w/  yag laser   VASECTOMY      Allergies:  Allergies  Allergen Reactions   Bee Venom Anaphylaxis    Family History  Problem Relation Age of Onset   Hypertension Mother    Hypertension Father    Aneurysm Father    Colon cancer Maternal Grandmother    Stroke Paternal Grandfather    Stroke Other    High Cholesterol Other        on both sides   Other Neg Hx     Social History:  reports that he has never smoked. He has never used smokeless tobacco. He reports that he does not drink alcohol and does not use drugs.  ROS: A complete review of systems was performed.  All systems are negative except for pertinent findings as noted.  Physical Exam:  Vital signs in last 24 hours:   Constitutional:  Alert and oriented, No acute distress Cardiovascular: Regular rate and rhythm Respiratory: Normal respiratory effort, Lungs clear bilaterally GI: Abdomen is soft, nontender, nondistended, no abdominal masses GU: No CVA tenderness Lymphatic: No lymphadenopathy Neurologic: Grossly intact, no focal deficits Psychiatric: Normal mood and affect  Laboratory Data:  No results for input(s): WBC, HGB,  HCT, PLT in the last 72 hours.  No results for input(s): NA, K, CL, GLUCOSE, BUN, CALCIUM, CREATININE in the last 72 hours.  Invalid input(s): CO3   No results found for this or any previous visit (from the past 24 hour(s)). No results found for this or any previous visit (from the past 240 hour(s)).  Renal Function: No results for input(s): CREATININE in the last 168 hours. Estimated Creatinine Clearance: 89.1 mL/min (by C-G formula based on SCr of 1.18 mg/dL).  Radiologic Imaging: No results found.  I independently reviewed the above imaging studies.  Assessment and Plan Adam Ford is a 60 y.o. male with  left renal stone here for cysto, L RPG, L URS/LL, L stent placement.  -The risks, benefits and alternatives of cystoscopy with cysto, L RPG, L URS/LL, L stent placement was discussed with the patient.  Risks include, but are not limited to: bleeding, urinary tract infection, ureteral injury, ureteral stricture disease, chronic pain, urinary symptoms, bladder injury, stent migration, the need for nephrostomy tube placement, MI, CVA, DVT, PE and the inherent risks with general anesthesia.  The patient voices understanding and wishes to proceed.   Matt R. Manahil Vanzile MD 06/04/2021, 11:17 AM  [facility] Pager: [phone]): [phone]

## 2021-06-05 ENCOUNTER — Encounter (HOSPITAL_COMMUNITY): Payer: Self-pay | Admitting: Urology

## 2021-06-06 ENCOUNTER — Ambulatory Visit: Payer: 59 | Admitting: Psychology

## 2021-06-26 ENCOUNTER — Other Ambulatory Visit: Payer: Self-pay

## 2021-06-26 ENCOUNTER — Encounter: Payer: 59 | Attending: Psychology

## 2021-06-26 DIAGNOSIS — R4189 Other symptoms and signs involving cognitive functions and awareness: Secondary | ICD-10-CM

## 2021-06-26 DIAGNOSIS — G500 Trigeminal neuralgia: Secondary | ICD-10-CM

## 2021-06-26 NOTE — Progress Notes (Signed)
Behavioral Observation  The patient appeared well-groomed and appropriately dressed for the testing session. His manners were polite and appropriate to the situation. The patient did not display a positive attitude toward testing but gave good effort. His speech was low and monotone throughout the session and his affect appeared negative.  Neuropsychology Note  Mali Aronoff completed 180 minutes of neuropsychological testing with technician, Dina Rich, BA, under the supervision of Ilean Skill, PsyD., Clinical Neuropsychologist. The patient did not appear overtly distressed by the testing session, per behavioral observation or via self-report to the technician. Rest breaks were offered.   Clinical Decision Making: In considering the patient's current level of functioning, level of presumed impairment, nature of symptoms, emotional and behavioral responses during clinical interview, level of literacy, and observed level of motivation/effort, a battery of tests was selected by Dr. [name] during initial consultation on 05/29/2021. This was communicated to the technician. Communication between the neuropsychologist and technician was ongoing throughout the testing session and changes were made as deemed necessary based on patient performance on testing, technician observations and additional pertinent factors such as those listed above.  Tests Administered: Controlled Oral Word Association Test (COWAT; FAS & Animals)  Wechsler Adult Intelligence Scale, 4th Edition (WAIS-IV) Wechsler Memory Scale, 4th Edition (WMS-IV); Adult Battery    Results:  WAIS-IV  Composite Score Summary  Scale Sum of Scaled Scores Composite Score Percentile Rank 95% Conf. Interval Qualitative Description  Verbal Comprehension 44 VCI 127 96 120-132 Superior  Perceptual Reasoning 46 PRI 131 98 123-136 Very Superior  Working Memory 21 WMI 102 55 95-109 Average  Processing Speed 18 PSI 94 34 86-103 Average   Full Scale 129 FSIQ 120 91 116-124 Superior  General Ability 90 GAI 133 99 127-137 Very Superior      Verbal Comprehension Subtests Summary  Subtest Raw Score Scaled Score Percentile Rank Reference Group Scaled Score SEM  Similarities 33 15 95 15 1.08  Vocabulary 45 12 75 13 0.73  Information 25 17 99 18 0.67  (Comprehension) 30 13 84 13 1.08       Perceptual Reasoning Subtests Summary  Subtest Raw Score Scaled Score Percentile Rank Reference Group Scaled Score SEM  Block Design 56 15 95 13 1.04  Matrix Reasoning 24 17 99 15 0.95  Visual Puzzles 17 14 91 11 0.99  (Figure Weights) 15 12 75 10 0.99  (Picture Completion) '9 8 25 7 '$ 1.12       Working Doctor, general practice Raw Score Scaled Score Percentile Rank Reference Group Scaled Score SEM  Digit Span '22 8 25 7 '$ 0.85  Arithmetic 18 13 84 13 1.04  (Letter-Number Seq.) 19 9 37 9 1.08       Processing Speed Subtests Summary  Subtest Raw Score Scaled Score Percentile Rank Reference Group Scaled Score SEM  Symbol Search '21 7 16 6 '$ 1.31  Coding 63 11 63 8 0.99  (Cancellation) '18 4 2 3 '$ 1.34      WMS-IV-Adult Battery  Index Score Summary  Index Sum of Scaled Scores Index Score Percentile Rank 95% Confidence Interval Qualitative Descriptor  Auditory Memory (AMI) 47 110 75 103-116 High Average  Visual Memory (VMI) 43 104 61 98-109 Average  Visual Working Memory (VWMI) 23 109 73 101-116 Average  Immediate Memory (IMI) 44 107 68 100-113 Average  Delayed Memory (DMI) 46 110 75 103-116 High Average      Primary Subtest Scaled Score Summary  Subtest Domain Raw Score Scaled Score Percentile Rank  Logical Memory I AM 33 13 84  Logical Memory II AM 26 12 75  Verbal Paired Associates I AM 28 10 50  Verbal Paired Associates II AM 11 12 75  Designs I VM 67 10 50  Designs II VM 56 11 63  Visual Reproduction I VM 37 11 63  Visual Reproduction II VM 26 11 63  Spatial Addition VWM 13 11 63  Symbol Span VWM 25  12 75      Auditory Memory Process Score Summary  Process Score Raw Score Scaled Score Percentile Rank Cumulative Percentage (Base Rate)  LM II Recognition 27 - - >75%  VPA II Recognition 37 - - 26-50%       Visual Memory Process Score Summary  Process Score Raw Score Scaled Score Percentile Rank Cumulative Percentage (Base Rate)  DE I Content '29 7 16 '$ -  DE I Spatial '14 8 25 '$ -  DE II Content '28 8 25 '$ -  DE II Spatial 10 9 37 -  DE II Recognition 15 - - 51-75%  VR II Recognition 6 - - 51-75%      ABILITY-MEMORY ANALYSIS  Ability Score:  GAI: 133 Date of Testing:  WAIS-IV; WMS-IV 2021/06/26  Predicted Difference Method   Index Predicted WMS-IV Index Score Actual WMS-IV Index Score Difference Critical Value  Significant Difference Y/N Base Rate  Auditory Memory 117 110 7 8.95 N   Visual Memory 119 104 15 8.82 Y 10-15%  Visual Working Memory 122 109 13 11.24 Y 10-15%  Immediate Memory 122 107 15 10.35 Y 10%  Delayed Memory 119 110 9 10.08 N   Statistical significance (critical value) at the .01 level.       COWAT FAS Total = 47 Z = 0.205 Animals Total = 22 Z = 0.019  Feedback to Patient: Mali Lebeck will return on 07/25/2021 for an interactive feedback session with Dr. [name] at which time his test performances, clinical impressions and treatment recommendations will be reviewed in detail. The patient understands he can contact our office should he require our assistance before this time.  180 minutes spent face-to-face with patient administering standardized tests, 30 minutes spent scoring Environmental education officer). [CPT [id number], [id number]  Full report to follow.

## 2021-07-04 ENCOUNTER — Other Ambulatory Visit: Payer: Self-pay

## 2021-07-04 MED ORDER — GABAPENTIN 300 MG PO CAPS: 900.0000 mg | ORAL_CAPSULE | Freq: Three times a day (TID) | ORAL | 1 refills | Status: DC

## 2021-07-18 ENCOUNTER — Encounter: Payer: 59 | Attending: Psychology | Admitting: Psychology

## 2021-07-18 ENCOUNTER — Other Ambulatory Visit: Payer: Self-pay

## 2021-07-18 DIAGNOSIS — R413 Other amnesia: Secondary | ICD-10-CM | POA: Diagnosis present

## 2021-07-18 DIAGNOSIS — G3184 Mild cognitive impairment, so stated: Secondary | ICD-10-CM | POA: Insufficient documentation

## 2021-07-18 DIAGNOSIS — G500 Trigeminal neuralgia: Secondary | ICD-10-CM | POA: Insufficient documentation

## 2021-07-18 NOTE — Progress Notes (Signed)
Neuropsychological Consultation   Patient:   Adam Ford   DOB:   1961/06/10  MR Number:  [id number]  Location:  [facility] [place], [place] [id number] [place] [address] Dept: [phone]           Date of Service:   05/29/2021  Start Time:   2 PM End Time:   4 PM  Today's visit was an in person visit that was conducted in my outpatient clinic office.  The patient myself were present.  1 hour and 15 minutes was spent in formal face-to-face clinical interview and the other 45 minutes was spent with records review, report writing and setting up testing protocols.  Provider/Observer:  Ilean Skill, Psy.D.       Clinical Neuropsychologist       Billing Code/Service: 96116/[id number]  Chief Complaint:    Adam Ford Is a 60 year old male referred by Debbora Presto, NP for neuropsychological evaluation as part of his overall neurological care.  The patient has been followed by [facility] for some time now due to supra occipital neuralgia/suborbital headaches.  The patient has been taking gabapentin as much is 600 mg 3 times a day but sometimes less.  Patient reports that if he misses a dose that the pain is increased including sharp stabbing pain over his right eye.  The patient has been describing increasing concerns about memory loss and times of confusion as well as difficulty with expressive language.  Repeating conversations at work and difficulty remembering where he places items or has parked his car are all noted.  Reason for Service:  Adam Ford Is a 60 year old male referred by Debbora Presto, NP for neuropsychological evaluation as part of his overall neurological care.  The patient has been followed by [facility] for some time now due to supra occipital neuralgia/suborbital headaches.  The patient has been taking gabapentin as  much is 600 mg 3 times a day but sometimes less.  Patient reports that if he misses a dose that the pain is increased including sharp stabbing pain over his right eye.  The patient has been describing increasing concerns about memory loss and times of confusion as well as difficulty with expressive language.  Repeating conversations at work and difficulty remembering where he places items or has parked his car are all noted.  The patient reports that his pain/headache started approximately 4 years ago and if he ever forgets his medications the pain starts up again and he gets very painful to the point that it interferes with his ability to work.  The patient reports that he started noticing memory difficulties and changes approximately 18 months ago.  Patient reports that approximately 6 or 8 months ago he had another incident where he did not know where he was and became quite concerned before he finally recognized where he was in was able to get home.  The patient reports that there are some days now he just knows that things are not right and has these bad days describing them as feeling funny in his head.  He will have times where he will walk into a room and not remember why he did so.  He will have times where he talks to subordinates about something and then they tell him that he is already talked with him about it and he does not remember the previous conversation.  When traveling sometimes he has to ask  his wife where they are going but when he is told he will then remember.  The patient reports that cueing does help with recall.  The patient reports that he noticed changes in his speech and word finding abilities about 2-1/2 years ago and feels very stressed that he is not getting enough air to speak.  He has significant reduction in his volume.  He reports that there are times where he cannot breathe for short periods of time and then gets a very funny feeling.  The patient describes memory changes  and speech changes as well as times of confusion.  His speech can be halting and weak at times and memory has been progressively worsening.  He also notes neuralgia for the past 4 years for unknown causes.  As described above he has had 2 occurrences when he was driving that he became lost.  He also reports headache on the right side of his head above his eye when he is not taking medicines.  The patient reports that he has been taking gabapentin for the past 3 years and has also tried nerve blocks.  The patient reports that at no time is he without his gabapentin.  The patient reports that his mother was diagnosed with Alzheimer's dementia at age 47 when the symptoms started.  The patient's brother has had some type of neurological issue and involve changes in balance.  The patient's grandmother had neuralgia of her face.  The patient reports that he is in bed around 11 PM and wakes up once or twice at night to use the bathroom.  He wakes up in the morning around 6 AM.  The patient describes a healthy diet and good appetite but often feels sick after few minutes and then is fine.  The patient reports that his wife is very concerned about what is going on but his kids are not really aware of the extent of his difficulties.  The patient has been followed by Debbora Presto, NP with [facility] since 2019.  His primary reason for neurological visits this is supra occipital neuralgia and continues with gabapentin.  He is taking 600 mg 2-3 times a day.  He has described similar symptoms to neurology where he is having difficulty speaking, difficulty breathing, feelings that his abdomen is tight.  Describes slowed reaction time and weakness.  As far back as 2020 the patient reports that he felt "a little sluggish with medication" but feels like the medication was helping him.  The patient had an MRI of the brain conducted on 12/20/2019.  The impression/results as interpreted by Jeannine Boga, MD  was of no acute intracranial abnormality.  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  There were a few scattered subcentimeter foci of T2/flair hyperintensities noted involving the supratentorial cerebral white matter that was felt to be within normal limits for age.  Behavioral Observation: Adam Ford  presents as a 60 y.o.-year-old Right handed Caucasian Male who appeared his stated age. his dress was Appropriate and he was Well Groomed and his manners were Appropriate to the situation.  his participation was indicative of Appropriate and Redirectable behaviors.  There were not physical disabilities noted.  he displayed an appropriate level of cooperation and motivation.     Interactions:    Active Appropriate and Redirectable  Attention:   abnormal and attention span appeared shorter than expected for age  Memory:  within normal limits; recent and remote memory intact  Visuo-spatial:  not examined  Speech (Volume):  normal  Speech:   normal; possibly some minor word finding issues noted during clinical interview  Thought Process:  Coherent and Relevant  Though Content:  WNL; not suicidal and not homicidal  Orientation:   person, place, time/date, and situation  Judgment:   Good  Planning:   Fair  Affect:    Appropriate  Mood:    Dysphoric  Insight:   Good  Intelligence:   high  Marital Status/Living: The patient was born and raised in [place] along with 4 siblings.  No major issues during childhood were noted other than a speech impediment with difficulties pronouncing R's.  The patient is married and continues to live with his wife of 42 years.  This is his only marriage.  The patient has a 69 year old son who has been diagnosed with multiple psychiatric issues including bipolar disorder and substance abuse.  Has a 56 year old son who is doing well and a 70 year old son who has  some issues with depression.  The patient also has a 20 year old son who is doing well.  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  The patient was laid off/retired in 2016 from one of his longer-term jobs and then resigned from another job in 2018.  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  Hobbies and interest include woodworking and Brewing technologist working.  Substance Use:  No concerns of substance abuse are reported.  Education:   The patient completed college and received his masters in Press photographer from [employer]  Medical History:   Past Medical History:  Diagnosis Date   Anxiety    Blepharitis, both eyes    Bulging of thoracic intervertebral disc    T8   Cancer (HCC)    basal cell nose   Glaucoma, both eyes    per pt no eye drops ,   Headache    with neuralgia   History of basal cell carcinoma (BCC) excision    s/p moh's surgery of nose in 2020   History of kidney stones    History of sepsis 12/16/2020   hospital admission in epic, secondary to UTI with ecoli and pyelonephritis   Hypercholesteremia    Hypertension    followed by pcp   OSA on CPAP    Pneumonia    PONV (postoperative nausea and vomiting)    Renal calculi    bilateral non-obstructive per abd imaging 02-19-2021   Supraorbital neuralgia    neurologist--- dr Jaynee Eagles----  mostly right side, takes gabapentin   Type 2 diabetes mellitus ([place])    followed by pcp  ---  (03-01-2021 pt stated checks blood sugar once wkly one hour after eats,  average 130--140)   Wears glasses          Patient Active Problem List   Diagnosis Date Noted   Benign essential hypertension 12/16/2020   Hyperlipidemia 12/16/2020   Morbid obesity ([place]) 12/16/2020   Type 2 diabetes mellitus with other specified complication ([place]) 16/07/9603   Acute pyelonephritis 12/16/2020   Sepsis secondary to UTI ([place])  12/16/2020   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  Family Med/Psych History:  Family History  Problem Relation Age of Onset   Hypertension Mother    Hypertension Father    Aneurysm Father    Colon cancer Maternal Grandmother    Stroke Paternal Grandfather    Stroke Other    High Cholesterol Other        on both sides   Other Neg Hx    Impression/DX:   Chief Complaint:    Adam Ford Is a 60 year old male referred by Debbora Presto, NP for neuropsychological evaluation as part of his overall neurological care.  The patient has been followed by [facility] for some time now due to supra occipital neuralgia/suborbital headaches.  The patient has been taking gabapentin as much is 600 mg 3 times a day but sometimes less.  Patient reports that if he misses a dose that the pain is increased including sharp stabbing pain over his right eye.  The patient has been describing increasing concerns about memory loss and times of confusion as well as difficulty with expressive language.  Repeating conversations at work and difficulty remembering where he places items or has parked his car are all noted.  Disposition/Plan:  We have set the patient up for formal neuropsychological evaluation and once the objective assessment/testing is completed a formal report will be produced and made available to the referring provider Debbora Presto, NP as well as being available in his EMR for review.  I will sit down with the patient and go over the results as well and address any diagnostic issues and concerns that are developed as well as treatment interventions.  Diagnosis:    Subjective memory complaints  Supraorbital neuralgia         Electronically Signed   _______________________ Ilean Skill, Psy.D. Clinical Neuropsychologist

## 2021-07-25 ENCOUNTER — Other Ambulatory Visit: Payer: Self-pay

## 2021-07-25 ENCOUNTER — Encounter: Payer: Self-pay | Admitting: Psychology

## 2021-07-25 ENCOUNTER — Encounter (HOSPITAL_BASED_OUTPATIENT_CLINIC_OR_DEPARTMENT_OTHER): Payer: 59 | Admitting: Psychology

## 2021-07-25 DIAGNOSIS — G3184 Mild cognitive impairment, so stated: Secondary | ICD-10-CM

## 2021-07-25 DIAGNOSIS — G500 Trigeminal neuralgia: Secondary | ICD-10-CM

## 2021-07-25 DIAGNOSIS — R413 Other amnesia: Secondary | ICD-10-CM | POA: Diagnosis not present

## 2021-07-25 NOTE — Progress Notes (Signed)
Neuropsychological Evaluation   Patient:  Adam Ford   DOB: Sep 05, 1961  MR Number: [id number]  Location: [facility] [place] [facility] [place], [id number] [place] [address] Dept: [phone]  Start: 8 AM End: 9 AM  Provider/Observer:     Edgardo Roys PsyD  Chief Complaint:      Chief Complaint  Patient presents with   Memory Loss   Other    Reports of changes in expressive language including word finding and verbal fluency changes as well as acute episodes of confusion and geographic disorientation    Reason For Service:      Adam Spearman Is a 60 year old male referred by Debbora Presto, NP for neuropsychological evaluation as part of his overall neurological care.  The patient has been followed by [facility] for some time now due to supraorbital neuralgia/suborbital headaches.  The patient has been taking gabapentin as much is 600 mg 3 times a day but sometimes less.  Patient reports that if he misses a dose that the pain is increased including sharp stabbing pain over his right eye.  The patient has been describing increasing concerns about memory loss and times of confusion as well as difficulty with expressive language.  Repeating conversations at work and difficulty remembering where he places items or has parked his car are all noted.  The patient reports that his pain/headache started approximately 4 years ago and if he ever forgets his medications the pain starts up again and he gets very painful to the point that it interferes with his ability to work.  The patient reports that he started noticing memory difficulties and changes approximately 18 months ago.  Patient reports that approximately 6 or 8 months ago he had another incident where he did not know where he was and became quite concerned before he finally recognized where he was in was able to  get home.  The patient reports that there are some days now he just knows that things are not right and has these bad days describing them as feeling funny in his head.  He will have times where he will walk into a room and not remember why he did so.  He will have times where he talks to subordinates about something and then they tell him that he is already talked with him about it and he does not remember the previous conversation.  When traveling sometimes he has to ask his wife where they are going but when he is told he will then remember.  The patient reports that cueing does help with recall.  The patient reports that he noticed changes in his speech and word finding abilities about 2-1/2 years ago and feels very stressed that he is not getting enough air to speak.  He has significant reduction in his volume.  He reports that there are times where he cannot breathe for short periods of time and then gets a very funny feeling.  The patient describes memory changes and speech changes as well as times of confusion.  His speech can be halting and weak at times and memory has been progressively worsening.  He also notes neuralgia for the past 4 years for unknown causes.  As described above he has had 2 occurrences when he was driving that he became lost.  He also reports headache on the right side of his head above his eye when he is not taking medicines.  The patient reports that he has been taking gabapentin for the past 3 years and has also tried nerve blocks.  The patient reports that at no time is he without his gabapentin.  The patient reports that his mother was diagnosed with Alzheimer's dementia at age 74 when the symptoms started.  The patient's brother has had some type of neurological issue and involve changes in balance.  The patient's grandmother had neuralgia of her face.  The patient reports that he is in bed around 11 PM and wakes up once or twice at night to use the bathroom.  He wakes up  in the morning around 6 AM.  The patient describes a healthy diet and good appetite but often feels sick after few minutes and then is fine.  The patient reports that his wife is very concerned about what is going on but his kids are not really aware of the extent of his difficulties.  The patient has been followed by Debbora Presto, NP with [facility] since 2019.  His primary reason for neurological visits this is supra occipital neuralgia and continues with gabapentin.  He is taking 600 mg 2-3 times a day.  He has described similar symptoms to neurology where he is having difficulty speaking, difficulty breathing, feelings that his abdomen is tight.  Describes slowed reaction time and weakness.  As far back as 2020 the patient reports that he felt "a little sluggish with medication" but feels like the medication was helping him.   The patient had an MRI of the brain conducted on 12/20/2019.  The impression/results as interpreted by Jeannine Boga, MD was of no acute intracranial abnormality.  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  There were a few scattered subcentimeter foci of T2/flair hyperintensities noted involving the supratentorial cerebral white matter that was felt to be within normal limits for age.  Tests Administered: Controlled Oral Word Association Test (COWAT; FAS & Animals)  Wechsler Adult Intelligence Scale, 4th Edition (WAIS-IV) Wechsler Memory Scale, 4th Edition (WMS-IV); Adult Battery    Participation Level:   Active  Participation Quality:  Resistant      Behavioral Observation:  The patient appeared well-groomed and appropriately dressed for the testing session. His manners were polite and appropriate to the situation. The patient did not display a positive attitude toward testing but gave good effort. His speech was low and monotone throughout the session and his  affect appeared negative.  Well Groomed, Alert, and Constricted.   Test Results:   Initially, an estimation was made as to the patient's historic/premorbid intellectual and cognitive functioning.  The patient graduated from college and completed his masters in [place] from [employer] and always did very well in school with particular strengths in mathematics and other practical logical types of classes.  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  The patient has likely maintained high average to superior range of intellectual and cognitive functioning and we will utilize a conservative estimate of around 120-130 estimations of historic/global functioning on standardized for comparison.  However, there may have been some variability in particular areas of functioning with some of his individual cognitive domains in the average range and others in the very high and superior range.    COWAT FAS Total = 47 Z = 0.205 Animals Total =  70 Z = 0.019 The patient was initially administered a number of expressive language measures assessing verbal fluency and word finding abilities.  On the FAS test the patient correctly identified 47 words in the allotted time which is slightly better than age and education matched normative sample.  Similar performance was achieved on the animal naming test with slightly above performance on age and education matched comparisons.  There were no objective findings of word finding or fluency changes.  WAIS-IV             Composite Score Summary        Scale Sum of Scaled Scores Composite Score Percentile Rank 95% Conf. Interval Qualitative Description  Verbal Comprehension 44 VCI 127 96 120-132 Superior  Perceptual Reasoning 46 PRI 131 98 123-136 Very Superior  Working Memory 21 WMI 102 55 95-109 Average  Processing Speed 18 PSI 94 34 86-103 Average  Full  Scale 129 FSIQ 120 91 116-124 Superior  General Ability 90 GAI 133 30 127-137 Very Superior      The patient was administered the Wechsler Adult Intelligence Scale-for to provide a highly standardized well structured assessment of multiple cognitive domains.  Given the fact that the patient has been reporting memory and cognitive changes these performances should not be used as complete estimations of lifelong cognitive functioning and intellectual functioning but should be used as current measures flexing his current status.  The patient produced a full-scale IQ score of 120 which falls at the 91st percentile relative to normative population and is in the superior range.  This performance is generally consistent with predicted levels and does not describe or predict any significant global changes in cognitive functioning.  We also calculated the patient's general abilities index score which places less emphasis on measures that potentially are most sensitive to acute changes including auditory encoding capacity and information processing speed measures.  The patient produced a general abilities index score of 133 which falls at the 99th percentile and is in the very superior range.  This is very consistent with predicted levels of historic/lifelong functioning.  It also indicates that the primary areas of weakness identified by the patient have to do with working memory/auditory encoding and information processing speed/focus execute abilities.  Both of these have to do with attentional variables primarily.           Verbal Comprehension Subtests Summary     Subtest Raw Score Scaled Score Percentile Rank Reference Group Scaled Score SEM  Similarities 33 15 95 15 1.08  Vocabulary 45 12 75 13 0.73  Information 25 17 99 18 0.67  (Comprehension) 30 13 84 13 1.08      The patient produced a verbal comprehension index score of 127 which falls at the 96 percentile and is in the superior range.  This is  consistent with predictions of historic functioning.  The patient did very well and consistent with predicted levels on measures of verbal reasoning and problem-solving, vocabulary knowledge, general fund of information and social judgment comprehension variables.             Perceptual Reasoning Subtests Summary     Subtest Raw Score Scaled Score Percentile Rank Reference Group Scaled Score SEM  Block Design 56 15 95 13 1.04  Matrix Reasoning 24 17 99 15 0.95  Visual Puzzles 17 14 91 11 0.99  (Figure Weights) 15 12 75 10 0.99  (Picture Completion) 9 8 25 7  1.12    The patient produced a perceptual  reasoning index score of 133 which falls at the 98th percentile and is in the superior range.  This again is consistent with predicted levels of historic/premorbid functioning.  All measures with the exception of one in this domain were in the superior to very superior range relative to normative populations.  This included excellent performance for visual analysis and organization, visual reasoning and problem-solving, visual estimation and judgments.  The patient did have some relative weakness with his ability to identify visual anomalies within a visual gestalt.  This was also one of the very early measures administered to him and he was rather frustrated initially with the testing and this may be reflective of some of that initial hesitancy with the testing.               Working Hydrographic surveyor Raw Score Scaled Score Percentile Rank Reference Group Scaled Score SEM  Digit Span 22 8 25 7  0.85  Arithmetic 18 13 84 13 1.04  (Letter-Number Seq.) 19 9 37 9 1.08      The patient produced a working memory index score of 102 which falls at the 55th percentile and is in the average range relative to a normative population.  There was significant scatter noted on this subtest performance.  For example on the pure auditory encoding measures (digit span was (the patient performed at  the lower end of the average range which is likely below his traditional performance levels.  He had a similar level of performance with regard to the letter number sequencing test but these are still at the lower end of the average range.  The patient did very well on the arithmetic subtest.  However, this is likely one of his lifelong strengths with regard to the mathematical portion of this measure but also shows that he is able to adequately encode and maintain auditory registers.             Processing Speed Subtests Summary    Subtest Raw Score Scaled Score Percentile Rank Reference Group Scaled Score SEM  Symbol Search 21 7 16 6  1.31  Coding 63 11 63 8 0.99  (Cancellation) 18 4 2 3  1.34    The patient produced a processing speed index score of 94 which falls in the 34th percentile and is in the average range.  There was considerable variability on subtest performance.  On one of the subtest the patient showed significant difficulty particularly with identifying anomalies and processing this visual information.  On another measure he also had difficulties with information processing speed and visual scanning/visual searching.  On the third measure he performed at the upper end of the average range and in the 63rd percentile.  These variabilities could represent varying times of effort and/or some underlying difficulty with identifying visual anomalies which we also saw on another subtest.  In any event, relatively speaking the patient shows weakness on measures of auditory encoding and information processing speed but his global performance in these 2 areas are still in the average range they are just below his excellent abilities with regard to verbal based skills and visual-spatial based skills.     WMS-IV-Adult Battery           Index Score Summary        Index Sum of Scaled Scores Index Score Percentile Rank 95% Confidence Interval Qualitative Descriptor  Auditory Memory (AMI) 47 110 75  103-116 High Average  Visual Memory (VMI) 43 104 61  98-109 Average  Visual Working Memory (VWMI) 23 109 73 101-116 Average  Immediate Memory (IMI) 44 107 68 100-113 Average  Delayed Memory (DMI) 46 110 75 103-116 High Average    The patient was then administered the Wechsler Memory Scale-IV to provide a highly structured excellently normed battery of a wide ranging areas of memory and learning.  This is one of the areas that the patient is describing significant issues.  On the Wechsler Adult Intelligence Scale the patient produced a working memory index score (auditory encoding) that falls in the average range and while not indicative of significant encoding deficits is below is likely historic/premorbid functioning.  On the Wechsler Memory Scale's the patient produced a visual working memory index score of 109 which falls at the [employer] and is also in the average range.  These show general consistency between auditory and visual encoding and certainly represent levels of performance that would not be overly deleterious in his ability to store and organize new visual or auditory information.  Breaking memory functions down between auditory versus visual memory the patient produced an auditory memory index score of 110 which falls at the 75th percentile and is in the high average range relative to a normative population.  This is slightly below predicted levels but still is falling in the high average range relative to a normative population and only slightly below the levels expected for individuals with college and/or graduate school degrees.  The patient produced a visual memory index score of 104 which falls at the 61st percentile and is in the average range.  This is slightly below his auditory memory functions but not statistically different.  This is also below predicted levels of historic performance.  Breaking the patient's memory functions down between immediate versus delayed the  patient produced an immediate memory index score of 107 which falls at the 68th percentile and is in the average range.  Similar performance was achieved on delayed memory where he produced a delayed memory index score of 110 which falls at the 75th percentile and is in the high average range.  While both of these are below predicted levels based on his education occupational history they are still in the average to high average range and it appears that the information that he initially stores and organizes (immediate memory) are effectively stored and available for later recall.  There is not appear to be any significant loss of information over period of delay.  Encoding, short-term memory including initially storage and organization of information as well as immediate and delayed memory are all consistent with each other and there is no variability between visual versus auditory information learning and storage.  The patient also did very well on recognition and cued formats which would be expected by his efficient performance on memory and learning and free recall formats.            Primary Subtest Scaled Score Summary    Subtest Domain Raw Score Scaled Score Percentile Rank  Logical Memory I AM 33 13 84  Logical Memory II AM 26 12 75  Verbal Paired Associates I AM 28 10 50  Verbal Paired Associates II AM 11 12 75  Designs I VM 67 10 50  Designs II VM 56 11 63  Visual Reproduction I VM 37 11 63  Visual Reproduction II VM 26 11 63  Spatial Addition VWM 13 11 63  Symbol Span VWM 25 12 75  Auditory Memory Process Score Summary      Process Score Raw Score Scaled Score Percentile Rank Cumulative Percentage (Base Rate)  LM II Recognition 27 - - >75%  VPA II Recognition 37 - - 26-50%                   Visual Memory Process Score Summary     Process Score Raw Score Scaled Score Percentile Rank Cumulative Percentage (Base Rate)  DE I Content 29 7 16  -  DE I Spatial 14  8 25  -  DE II Content 28 8 25  -  DE II Spatial 10 9 37 -  DE II Recognition 15 - - 51-75%  VR II Recognition 6 - - 51-75%          ABILITY-MEMORY ANALYSIS   Ability Score:    GAI: 133 Date of Testing:           WAIS-IV; WMS-IV 2021/06/26             Predicted Difference Method    Index Predicted WMS-IV Index Score Actual WMS-IV Index Score Difference Critical Value   Significant Difference Y/N Base Rate  Auditory Memory 117 110 7 8.95 N    Visual Memory 119 104 15 8.82 Y 10-15%  Visual Working Memory 122 109 13 11.24 Y 10-15%  Immediate Memory 122 107 15 10.35 Y 10%  Delayed Memory 119 110 9 10.08 N    Statistical significance (critical value) at the .01 level.            Summary of Results:   Overall, the results of the objective neuropsychological assessment are consistent with some mild reduction in relative performance in some areas including mild relative weaknesses in auditory and visual memory and auditory and visual encoding capacity.  However, while predicted scores would have been higher than his achieved scores on various memory and learning task they are still within the average range relative to a normative population.  There was no difference between auditory versus visual memory capacity and his level of encoding capacity would predict the level of short-term memory as well as intermediate delayed memory functions.  The patient did very well on measures of verbal comprehension and visual-spatial and perceptual reasoning type task.  This performance was consistent with predicted levels falling in the superior range relative to a normative population.  The patient did have average performance with regard to encoding capacity and information processing speed/visual scanning and visual searching components.  There were no indications of significant expressive language changes and he did quite well on verbal fluency and word finding measures when compared against age and  education adjusted normative samples.  Impression/Diagnosis:   Overall, there is some consistency between subjective reports of memory changes and difficulties.  These were some mild relative weaknesses but his performance on memory variables were still in the average range in the level of short-term, intermediate and delayed memory functions were consistent with what would be predicted on measures of visual and auditory encoding which is a prerequisite for memory.  The patient showed excellent performance for verbal comprehension skills, visual-spatial and visual constructional abilities and average performances with regard to auditory and visual encoding and information processing speed.  However, level of difficulties the patient is described were not clearly seen in these measures and the primary issue noted had to do with relative weaknesses on measures of encoding capacity and focus execute abilities both of which primarily are related to attentional deficits.  There  was no indication of any parietal lobe abnormalities at all as the patient did very well in visual-spatial type skills and maintain good mathematics/arithmetic skills and excellent verbal comprehension skills.  The patient's subjective reports do describe the development of lethargy etc. developing with the start of his supraorbital neuralgia and the initiation of gabapentin.  The patient reports that the gabapentin has been very helpful for control of his headaches and if he misses 1 day of this medicine his headache/pain symptoms will become exacerbated.  He has gone up on his dose over time and is also been prescribed oxycodone acetaminophen as another intervention tool for his headache and pain.  As far as diagnostic considerations, course of these difficulties and descriptions of acute episodes of confusion and isolated number of geographic disorientation's and confusions as well as sustained difficulties with lethargy and attention and  concentration/memory in conjunction with obtained objective neuropsychological performance are not consistent with any type of progressive dementia or progressive neurological condition.  The most likely cause of his difficulties are a combination of pain symptoms consistent with his supraorbital neuralgia and he has treatment utilizing gabapentin/Percocet as well as increasing vigilance and anxiety around observed episodes of difficulties with cognitive functioning.  The patient early on describes some changes in his performance when he first started taking the gabapentin and the level of gabapentin that he is using could easily cause impacts on his attention and concentration in particular variables related to auditory/visual encoding functions and focus execute abilities.  I suspect that a combination of medications, pain, anxiety and hypervigilance are the primary culprits behind his objective and subjective symptoms and at this point there are no indications of progressive neurological conditions such as Alzheimer's and in fact some of the early changes associated with Alzheimer's are some of his best areas of cognitive functioning.  As far as recommendations, the patient appears to respond very well to his gabapentin regarding management of his pain associated with supraorbital neuralgia and without this medication his level of pain has a significant deleterious effect on his ability to cope and manage.  While it may be worthwhile looking for other medicines that could help with his pain the residual side effects that he is experience are not uncommon and combining that with his now increased worry as his mother had been diagnosed with Alzheimer's.  Some degree of hypervigilance around his issues.  The patient does have objective findings of cognitive weaknesses that are below predicted levels of historic/functioning.  However, these are in very isolated domains related to auditory and visual encoding and  focus execute abilities which are the primary areas of difficulties if gabapentin and other medicines were playing a role in having an impact.  I will sit down with the patient and go over the results of the current neuropsychological evaluation and talk in more depth about treatment options.  The patient also has a follow-up with Amy Lomax with [facility] in March 2023 and I will make sure that she gets a copy of this report as well to see if there are any adjustments that would be warranted prior to her next scheduled appointment.  Diagnosis:    Supraorbital neuralgia  Memory loss due to medical condition  Mild cognitive impairment   _____________________ Ilean Skill, Psy.D. Clinical Neuropsychologist

## 2021-07-26 ENCOUNTER — Encounter: Payer: Self-pay | Admitting: Psychology

## 2021-07-26 NOTE — Progress Notes (Signed)
07/25/2021 2 PM-3 PM:  Today's visit was an in person visit that was conducted in my outpatient clinic office with myself and the patient present.  We reviewed the results of the recent neuropsychological evaluation is what is going over particular treatment and intervention strategies.  The patient does not appear to have any patterns consistent with a progressive neurological degenerative condition.  The results in combination with subjective symptoms and clinical features and review of available medical records would suggest that the primary culprits are combination of his significant headache and pain symptoms, medication usage, and anxiety with increasing hypervigilance.  I will include a copy of the summary and results below for convenience.  The entire neuropsychological evaluation can be found in his EMR dated 07/18/2021.    Summary of Results:                        Overall, the results of the objective neuropsychological assessment are consistent with some mild reduction in relative performance in some areas including mild relative weaknesses in auditory and visual memory and auditory and visual encoding capacity.  However, while predicted scores would have been higher than his achieved scores on various memory and learning task they are still within the average range relative to a normative population.  There was no difference between auditory versus visual memory capacity and his level of encoding capacity would predict the level of short-term memory as well as intermediate delayed memory functions.  The patient did very well on measures of verbal comprehension and visual-spatial and perceptual reasoning type task.  This performance was consistent with predicted levels falling in the superior range relative to a normative population.  The patient did have average performance with regard to encoding capacity and information processing speed/visual scanning and visual searching components.  There were  no indications of significant expressive language changes and he did quite well on verbal fluency and word finding measures when compared against age and education adjusted normative samples.   Impression/Diagnosis:                     Overall, there is some consistency between subjective reports of memory changes and difficulties.  These were some mild relative weaknesses but his performance on memory variables were still in the average range in the level of short-term, intermediate and delayed memory functions were consistent with what would be predicted on measures of visual and auditory encoding which is a prerequisite for memory.  The patient showed excellent performance for verbal comprehension skills, visual-spatial and visual constructional abilities and average performances with regard to auditory and visual encoding and information processing speed.  However, level of difficulties the patient is described were not clearly seen in these measures and the primary issue noted had to do with relative weaknesses on measures of encoding capacity and focus execute abilities both of which primarily are related to attentional deficits.  There was no indication of any parietal lobe abnormalities at all as the patient did very well in visual-spatial type skills and maintain good mathematics/arithmetic skills and excellent verbal comprehension skills.  The patient's subjective reports do describe the development of lethargy etc. developing with the start of his supraorbital neuralgia and the initiation of gabapentin.  The patient reports that the gabapentin has been very helpful for control of his headaches and if he misses 1 day of this medicine his headache/pain symptoms will become exacerbated.  He has gone up on his dose over  time and is also been prescribed oxycodone acetaminophen as another intervention tool for his headache and pain.  As far as diagnostic considerations, course of these difficulties and  descriptions of acute episodes of confusion and isolated number of geographic disorientation's and confusions as well as sustained difficulties with lethargy and attention and concentration/memory in conjunction with obtained objective neuropsychological performance are not consistent with any type of progressive dementia or progressive neurological condition.  The most likely cause of his difficulties are a combination of pain symptoms consistent with his supraorbital neuralgia and he has treatment utilizing gabapentin/Percocet as well as increasing vigilance and anxiety around observed episodes of difficulties with cognitive functioning.  The patient early on describes some changes in his performance when he first started taking the gabapentin and the level of gabapentin that he is using could easily cause impacts on his attention and concentration in particular variables related to auditory/visual encoding functions and focus execute abilities.  I suspect that a combination of medications, pain, anxiety and hypervigilance are the primary culprits behind his objective and subjective symptoms and at this point there are no indications of progressive neurological conditions such as Alzheimer's and in fact some of the early changes associated with Alzheimer's are some of his best areas of cognitive functioning.   As far as recommendations, the patient appears to respond very well to his gabapentin regarding management of his pain associated with supraorbital neuralgia and without this medication his level of pain has a significant deleterious effect on his ability to cope and manage.  While it may be worthwhile looking for other medicines that could help with his pain the residual side effects that he is experience are not uncommon and combining that with his now increased worry as his mother had been diagnosed with Alzheimer's.  Some degree of hypervigilance around his issues.  The patient does have objective  findings of cognitive weaknesses that are below predicted levels of historic/functioning.  However, these are in very isolated domains related to auditory and visual encoding and focus execute abilities which are the primary areas of difficulties if gabapentin and other medicines were playing a role in having an impact.  I will sit down with the patient and go over the results of the current neuropsychological evaluation and talk in more depth about treatment options.  The patient also has a follow-up with Amy Lomax with [facility] in March 2023 and I will make sure that she gets a copy of this report as well to see if there are any adjustments that would be warranted prior to her next scheduled appointment.   Diagnosis:                                Supraorbital neuralgia   Memory loss due to medical condition   Mild cognitive impairment     _____________________ Ilean Skill, Psy.D. Clinical Neuropsychologist

## 2022-01-09 NOTE — Progress Notes (Addendum)
 PATIENT: Adam Ford DOB: 09/18/1961  REASON FOR VISIT: follow up HISTORY FROM: patient  Chief Complaint  Patient presents with   Follow-up    Pt alone, rm 1. Pt states that he is not requiring the amount of gabapentin  that is ordered. He states that he is doing well with this. He feels more like himself. He doesn't take it during the day. He may take 2 in the am and 2-3 at bedtime.      HISTORY OF PRESENT ILLNESS:  01/10/22 ALL: Adam returns for follow up for supraoccipital neuralgia. He continues gabapentin  900mg  BID-TID. He tries to skip lunch doses during the work week. He feels pain is well managed. He has occasional headaches. He feels sharper with decreased dose of gabapentin . He was seen by Dr Cheryll Corti for formal neurocog eval. Results suggested no concerns of neuro degenerative disease. He was advised to follow up as needed.   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 He was taking gabapentin  600mg  three times daily. He reports that head pain is well controlled. Gabapentin  was increased recently by GI for concerns of abdominal pain thought to be neuropathic in nature. He has follow up with them next month. He is tolerating 900mg  three times daily.    He reports concerns of memory loss and confusion at times. He is back to work full time. 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 He states that he wrote the report over three times as he did not remember witting it last week. He called a coworker yesterday to have the same conversation he had already had. He is able to drive without difficulty. He has no difficulty remembering where he has placed items or parked car. He is able to manage medications and finances independently. He performs ADLs independently. MRI was normal 12/2019. Mother has AD.    12/20/2019 ALL:  Adam Ford is a 61 y.o. male here today for follow up for supraoccipital neuralgia. He continues gabapentin . He is taking 600mg   2-3 times daily. He does feel that gabapentin  helps as if he misses a dose, pain is increased. He continues to have sharp stabbing pain over the right eye.   Today, in the office, he is having difficulty speaking, difficulty breathing, reports a feeling that his abdomen is tight. He denies chest pain. He feels slow in reaction time and weak. Symptoms started about an hour ago. She does have a headache today but not out of proportion to other headaches. He feels better when he is able to stretch out. He reports having intermittent symptoms starting in 06/2019 but over the past month these symptoms have occurred multiple times daily and are intensifying. He denies obvious weakness. He was able to work today and drove to his appointment. He was seen for a similar event in 06/2019 but was not having difficulty speaking, reported difficulty getting deep breath and abdominal tightness. CT abdomen showed kidney stones. He was seen by PCP a couple of weeks ago following my recommendation via Mychart message for similar complaints and started on duloxetine. He did not feel it helped and discontinued this about a week ago.  He has not had recent head imaging since symptoms started. Last MR brain, MR angio and MR orbits unremarkable in 04/2018.    HISTORY: (copied from my note on 06/01/2019)  Adam Ford is a 61 y.o. male here today for follow up of suborbital headaches. He was started on amitriptyline  25mg  at bedtime  at last visit in 04/2019. Unfortunately, he was unable to tolerate side effects. He has continues gabapentin  300mg  up to five times daily during the week and 600mg  three times daily on the weekends. He does feel a little sluggish with medication but feels headaches are stable. He continues to have intermittent pain about 1-2 times daily. Pain is just over the right eye. It resolves in a few minutes to an hour. No other symptoms associated with pain.  It is much better on medication.    History (copied  from my note on 04/22/2019)   Adam Ford is a 61 y.o. male here today for follow up of suborbital headaches. He has been taking gabapentin  300mg  in the am, 300mg  at lunch and 600mg  at bedtime. He reported grogginess with 600mg  TID dosing. He was doing well untl 3-4 days ago when headaches returned and are refractory to treatment. He has increased gabapentin  to 600mg  TID with an extra 300mg  dose around 5pm. He reports no changes in the headache other than this is more of a dull achy pain rather than sharp stabbing pain. He reports pain above his right eye as in the past. He has not noticed significant light or sound sensitivity but does report "feeling better when I wear my sunglasses." He denies nausea or vomiting. No vision changes. He reports that headache will spontaneously resolve with time.   HISTORY (copied from my note on 12/17/2018)   Adam Ford is a 61 y.o. male here today for follow up for supraoccipital neuralgia. He continues Gabapentin  600mg  up to 3 times a day. He has tried carbamazepine and topiramate in the past that caused side effects. He feels that gabapentin  is working better than anything. He does have concerns of feeling groggy with gabapentin . He has taken Lyrica in the past with similar side effects. He is trying to adjust his dose where he doesn't feel as sleepy.    He was seen for consideration of ablation therapy and nerve stimulator but he is not interested at this time.    HISTORY: (copied from Dr Harding Li note on 04/23/2018) HPI:  Adam Ford is a 61 y.o. male here as a referral from Dr. Randle Butler for eye pain rule out cluster headaches.  Past medical history anxiety, hypertension, high cholesterol. Back in February after the new year he had a regular eye check and a month later he had pain over his right eye and headaches. Headaches worsened. He saw ophtho again Dr. Faylene Hoots and everything was fine. Dr. Tellis Feathers performed sinus procedures in February in the same timeframe the  headaches started but unclear. Headaches worsening and a dull pain over the forehead. Mostly on the right, right at one point (points to the supratrochlear nerve). Every other week he gets a sharp strong pain he had to close his eye and squint and slow down, no lacrimation or other autonomic symptoms. Lasts briefly 10 minutes not stabbing. The pain above the right eyebrown is continuous all day long and the forehead is dull. He wake up with the headaches, alleve and tylenol . Ringing in the ears. Worsening.  No migrainous symptoms. Blurry.double vision right eye, can wake with the headaches and position makes it worse. No other focal neurologic deficits, associated symptoms, inciting events or modifiable factors.   Reviewed notes, labs and imaging from outside physicians, which showed:   Reviewed [facility].  Patient was seen for an emergency visit.  Chief complaint was feeling weak and a sore sensation in the right  eye for 3 weeks.  Headache above the right brow that last from several minutes to an hour.  Resolves on its own not currently on treatment.  Examination showed no APD, extraocular movements intact, external exam normal, intraocular pressure borderline, slit-lamp exam unremarkable, no optic nerve edema normal-appearing optic nerves, diagnosed with tension headache versus cluster headaches, pain in her around the eye, borderline glaucoma angle with borderline findings.    Months ago started having pain of her right eye went to eye doctor and underwent testing to determine eyes not the issue always a dull ache above his right eye once a week or every other week he will have worsened shooting pain, starting to have low-grade pain over both eyes he takes 2 Aleve's and 2 Tylenols twice daily also for bulging disc if he does not take those the pain is worse.   REVIEW OF SYSTEMS: Out of a complete 14 system review of symptoms, the patient complains only of the following symptoms, see hpi and  all other reviewed systems are negative.  ALLERGIES: Allergies  Allergen Reactions   Bee Venom Anaphylaxis    HOME MEDICATIONS: Outpatient Medications Prior to Visit  Medication Sig Dispense Refill   methocarbamol (ROBAXIN) 500 MG tablet Take 500 mg by mouth every 6 (six) hours as needed.     amLODipine -benazepril  (LOTREL) 10-40 MG capsule Take 1 capsule by mouth at bedtime.     Cholecalciferol  (VITAMIN D3) 50 MCG (2000 UT) capsule Take 2,000 Units by mouth at bedtime.     dicyclomine  (BENTYL ) 20 MG tablet Take 10-20 mg by mouth daily as needed.     docusate sodium  (COLACE) 100 MG capsule Take 1 capsule (100 mg total) by mouth daily as needed for up to 30 doses. 30 capsule 0   hydrochlorothiazide (HYDRODIURIL) 25 MG tablet Take 25 mg by mouth daily.     metFORMIN (GLUCOPHAGE-XR) 500 MG 24 hr tablet Take 500 mg by mouth daily with supper.     metoprolol  succinate (TOPROL -XL) 50 MG 24 hr tablet Take 100 mg by mouth at bedtime.     Multiple Vitamins-Minerals (CENTRUM SILVER 50+MEN PO) Take 1 tablet by mouth at bedtime.     oxyCODONE -acetaminophen  (PERCOCET) 5-325 MG tablet Take 1 tablet by mouth every 4 (four) hours as needed for up to 18 doses for severe pain. 18 tablet 0   Potassium Citrate 15 MEQ (1620 MG) TBCR Take 1 tablet by mouth 2 (two) times daily.     rosuvastatin  (CRESTOR ) 10 MG tablet Take 10 mg by mouth at bedtime.     tamsulosin  (FLOMAX ) 0.4 MG CAPS capsule Take 1 capsule (0.4 mg total) by mouth daily. 30 capsule 1   fluticasone (FLONASE) 50 MCG/ACT nasal spray Place 1 spray into both nostrils at bedtime.     gabapentin  (NEURONTIN ) 300 MG capsule Take 3 capsules (900 mg total) by mouth 3 (three) times daily. 810 capsule 1   phenylephrine  (NEO-SYNEPHRINE) 1 % nasal spray Place 1 drop into both nostrils daily as needed for congestion.     No facility-administered medications prior to visit.    PAST MEDICAL HISTORY: Past Medical History:  Diagnosis Date   Anxiety     Blepharitis, both eyes    Bulging of thoracic intervertebral disc    T8   Cancer (HCC)    basal cell nose   Glaucoma, both eyes    per pt no eye drops ,   Headache    with neuralgia   History of basal cell  carcinoma (BCC) excision    s/p moh's surgery of nose in 2020   History of kidney stones    History of sepsis 12/16/2020   hospital admission in epic, secondary to UTI with ecoli and pyelonephritis   Hypercholesteremia    Hypertension    followed by pcp   OSA on CPAP    Pneumonia    PONV (postoperative nausea and vomiting)    Renal calculi    bilateral non-obstructive per abd imaging 02-19-2021   Supraorbital neuralgia    neurologist--- dr Tresia Fruit----  mostly right side, takes gabapentin    Type 2 diabetes mellitus (HCC)    followed by pcp  ---  (03-01-2021 pt stated checks blood sugar once wkly one hour after eats,  average 130--140)   Wears glasses     PAST SURGICAL HISTORY: Past Surgical History:  Procedure Laterality Date   APPENDECTOMY  1969   CYST EXCISION Left    hip   CYSTOSCOPY/URETEROSCOPY/HOLMIUM LASER/STENT PLACEMENT  last one 2012 approx.   CYSTOSCOPY/URETEROSCOPY/HOLMIUM LASER/STENT PLACEMENT Right 03/05/2021   Procedure: CYSTOSCOPY/RETROGRADE/URETEROSCOPY/STENT PLACEMENT;  Surgeon: Lahoma Pigg, MD;  Location: [facility];  Service: Urology;  Laterality: Right;   CYSTOSCOPY/URETEROSCOPY/HOLMIUM LASER/STENT PLACEMENT Right 04/27/2021   Procedure: CYSTOSCOPY/RETROGRADE/URETEROSCOPY/HOLMIUM LASER/STENT PLACEMENT;  Surgeon: Lahoma Pigg, MD;  Location: [facility];  Service: Urology;  Laterality: Right;   CYSTOSCOPY/URETEROSCOPY/HOLMIUM LASER/STENT PLACEMENT Left 06/04/2021   Procedure: CYSTOSCOPY/RETROGRADE/URETEROSCOPY/HOLMIUM LASER/STENT PLACEMENT;  Surgeon: Lahoma Pigg, MD;  Location: [facility];  Service: Urology;  Laterality: Left;  ONLY NEEDS 60 MIN   EXTRACORPOREAL SHOCK WAVE LITHOTRIPSY  last one 2012  approx.   GASTRIC SLEEVE  2017 approx    LAPAROSCOPIC CHOLECYSTECTOMY  2008 approx   lasers yag iridotomy Bilateral    LIPOMA EXCISION Left 2020   Ear Dr. Tellis Feathers   MOHS SURGERY  2020   nose   NASAL TURBINATE REDUCTION  2018   NASAL VALVE COLLAPSE REPAIR  2019   TONSILLECTOMY  child   TRABECULECTOMY Bilateral 2019   w/  yag laser   VASECTOMY      FAMILY HISTORY: Family History  Problem Relation Age of Onset   Hypertension Mother    Hypertension Father    Aneurysm Father    Colon cancer Maternal Grandmother    Stroke Paternal Grandfather    Stroke Other    High Cholesterol Other        on both sides   Other Neg Hx     SOCIAL HISTORY: Social History   Socioeconomic History   Marital status: Married    Spouse name: Not on file   Number of children: 4   Years of education: Not on file   Highest education level: Master's degree (e.g., MA, MS, MEng, MEd, MSW, MBA)  Occupational History   Not on file  Tobacco Use   Smoking status: Never   Smokeless tobacco: Never  Vaping Use   Vaping Use: Never used  Substance and Sexual Activity   Alcohol use: Never   Drug use: Never   Sexual activity: Not on file    Comment: VASECTOMY  Other Topics Concern   Not on file  Social History Narrative   Lives at home with his wife & children   Right handed   Caffeine: quit in 2015 but now drinks diet soda which helps his headaches. He drinks about 6-7 cans during the work days.    Social Determinants of Health   Financial Resource Strain: Not on file  Food Insecurity:  Not on file  Transportation Needs: Not on file  Physical Activity: Not on file  Stress: Not on file  Social Connections: Not on file  Intimate Partner Violence: Not on file     PHYSICAL EXAM  Vitals:   01/10/22 1049  BP: 135/80  Pulse: 62  Weight: 265 lb (120.2 kg)  Height: 5\' 11"  (1.803 m)    Body mass index is 36.96 kg/m.  Generalized: Well developed Cardiology: normal rate and rhythm, no murmur noted Respiratory: clear to auscultation  bilaterally Neurological examination  Mentation: Alert, oriented to person and place.  Cranial nerve II-XII: Pupils were equal round reactive to light. Extraocular movements were full, visual field were full on confrontational test. Facial sensation and strength were normal. Uvula tongue midline. Head turning and shoulder shrug  were normal and symmetric. Motor: The motor testing reveals 5 over 5 strength of all 4 extremities. Good symmetric motor tone is noted throughout.  Sensory: Sensory testing is intact to soft touch on all 4 extremities. No evidence of extinction is noted.  Coordination: Cerebellar testing reveals good finger-nose-finger and heel-to-shin bilaterally.  Gait and station: gait normal    DIAGNOSTIC DATA (LABS, IMAGING, TESTING) - I reviewed patient records, labs, notes, testing and imaging myself where available.      View : No data to display.           Lab Results  Component Value Date   WBC 5.6 05/23/2021   HGB 15.6 05/23/2021   HCT 46.3 05/23/2021   MCV 83.9 05/23/2021   PLT 215 05/23/2021      Component Value Date/Time   NA 140 05/23/2021 0830   NA 142 04/23/2018 0902   K 3.6 05/23/2021 0830   CL 102 05/23/2021 0830   CO2 29 05/23/2021 0830   GLUCOSE 133 (H) 05/23/2021 0830   BUN 20 05/23/2021 0830   BUN 18 04/23/2018 0902   CREATININE 1.18 05/23/2021 0830   CALCIUM  9.5 05/23/2021 0830   PROT 6.1 (L) 12/18/2020 0741   PROT 7.0 04/23/2018 0902   ALBUMIN 2.7 (L) 12/18/2020 0741   ALBUMIN 4.5 04/23/2018 0902   AST 17 12/18/2020 0741   ALT 15 12/18/2020 0741   ALKPHOS 66 12/18/2020 0741   BILITOT 1.1 12/18/2020 0741   BILITOT 0.8 04/23/2018 0902   GFRNONAA >60 05/23/2021 0830   GFRAA >60 12/20/2019 1851   No results found for: CHOL, HDL, LDLCALC, LDLDIRECT, TRIG, CHOLHDL Lab Results  Component Value Date   HGBA1C 6.5 (H) 04/13/2021   No results found for: VITAMINB12 Lab Results  Component Value Date   TSH 0.956 04/23/2018        ASSESSMENT AND PLAN 61 y.o. year old male  has a past medical history of Anxiety, Blepharitis, both eyes, Bulging of thoracic intervertebral disc, Cancer (HCC), Glaucoma, both eyes, Headache, History of basal cell carcinoma (BCC) excision, History of kidney stones, History of sepsis (12/16/2020), Hypercholesteremia, Hypertension, OSA on CPAP, Pneumonia, PONV (postoperative nausea and vomiting), Renal calculi, Supraorbital neuralgia, Type 2 diabetes mellitus (HCC), and Wears glasses. here with   No diagnosis found.   Adam presents today for follow-up for supraorbital neuralgia. He is doing well on gabapentin . He is taking 300mg  BID most days. He requests we adjust rx to reflex this as he has an abundance of medication at home. He will call with worsening symptoms. Neurodegenerative eval was unremarkable. He will continue memory compensation strategies. Continue close follow up with care team. Continue CPAP and PCP follow up. Follow  up with me in 1 year. He verbalizes understanding and agreement with plan.   No orders of the defined types were placed in this encounter.    Meds ordered this encounter  Medications   gabapentin  (NEURONTIN ) 300 MG capsule    Sig: Take 3 capsules (900 mg total) by mouth 2 (two) times daily.    Dispense:  180 capsule    Refill:  11    May do 30 or 90 day supply    Order Specific Question:   Supervising Provider    Answer:   Glory Larsen [[id number]]      JYN WGNFA, FNP-C 01/10/2022, 11:51 AM [facility] [address], [address] [place], [place] [address] [phone]

## 2022-01-10 ENCOUNTER — Ambulatory Visit: Payer: 59 | Admitting: Family Medicine

## 2022-01-10 ENCOUNTER — Encounter: Payer: Self-pay | Admitting: Family Medicine

## 2022-01-10 VITALS — BP 135/80 | HR 62 | Ht 71.0 in | Wt 265.0 lb

## 2022-01-10 DIAGNOSIS — G500 Trigeminal neuralgia: Secondary | ICD-10-CM | POA: Diagnosis not present

## 2022-01-10 MED ORDER — GABAPENTIN 300 MG PO CAPS: 900.0000 mg | ORAL_CAPSULE | Freq: Two times a day (BID) | ORAL | 11 refills | Status: DC

## 2022-01-10 NOTE — Patient Instructions (Signed)
Below is our plan: ? ?We will continue gabapentin '900mg'$  twice daily. May use extra dose if needed. I have adjusted prescription to reflect lower dose to avoid stockpile of med but please let me know if you need me.  ? ?Please make sure you are staying well hydrated. I recommend 50-60 ounces daily. Well balanced diet and regular exercise encouraged. Consistent sleep schedule with 6-8 hours recommended.  ? ?Please continue follow up with care team as directed.  ? ?Follow up with me in 1 year ? ?You may receive a survey regarding today's visit. I encourage you to leave honest feed back as I do use this information to improve patient care. Thank you for seeing me today!  ? ? ?

## 2022-08-14 IMAGING — CT CT ABD-PELV W/ CM
2 of 5 series · 16 of 46 positions shown, 18 images · IV contrast (omnipaque)
Comparison: CT 07/11/2019

CLINICAL DATA: RIGHT lower quadrant pain with chills.  Fever

EXAM:
CT ABDOMEN AND PELVIS WITH CONTRAST
TECHNIQUE: Multidetector CT imaging of the abdomen and pelvis was performed
using the standard protocol following bolus administration of
intravenous contrast.
CONTRAST:  100mL OMNIPAQUE IOHEXOL 300 MG/ML  SOLN

[Series 3: abdomen 5.0 · axial · 0.98mm/px · z∈[+808,+1228]mm · 13 of 100 slices shown, 15 images]
[im 8/100  soft-tissue]
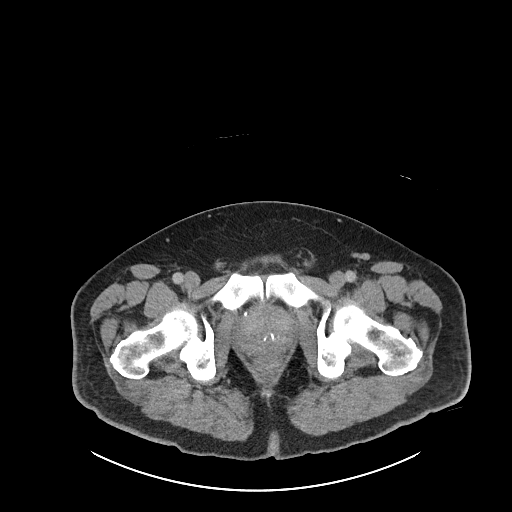
[im 8/100  bone]
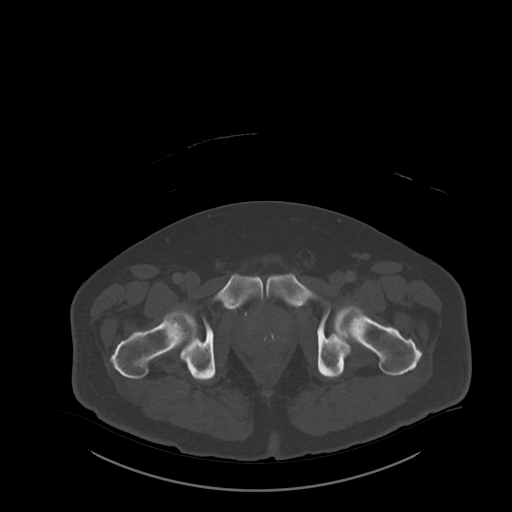
[im 15/100  soft-tissue]
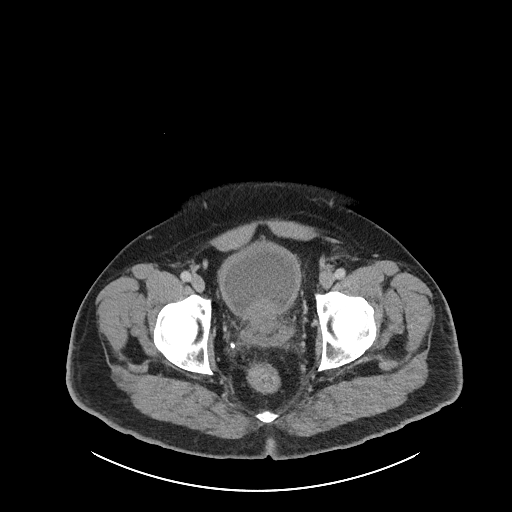
[im 22/100  soft-tissue]
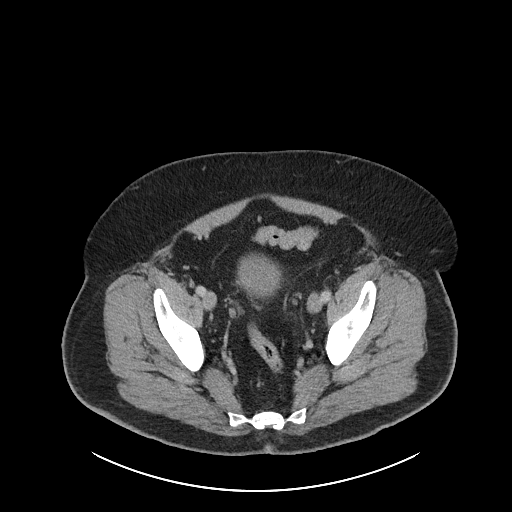
[im 29/100  soft-tissue]
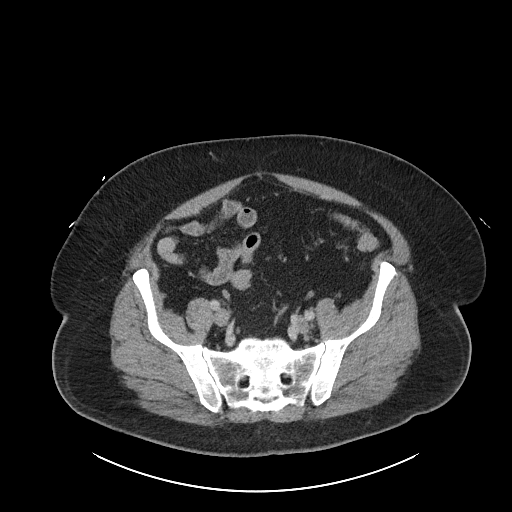
[im 36/100  soft-tissue]
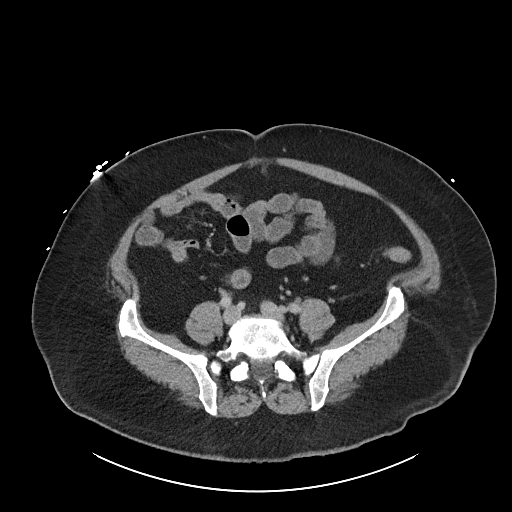
[im 43/100  soft-tissue]
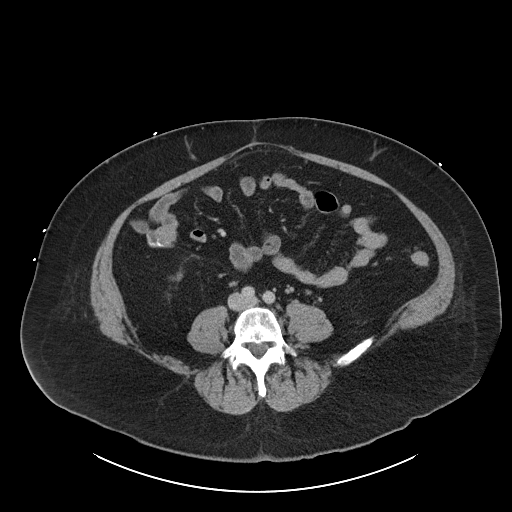
[im 50/100  soft-tissue]
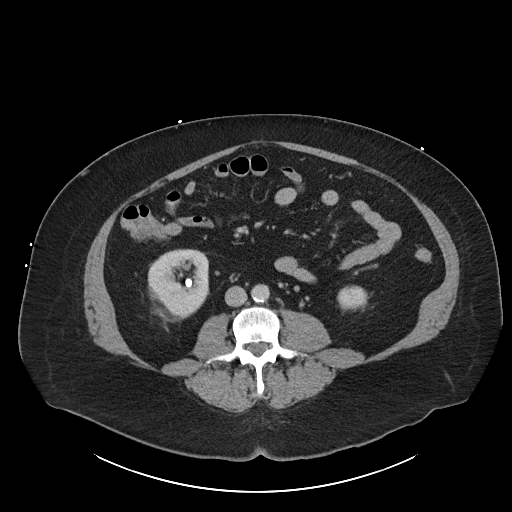
[im 57/100  soft-tissue]
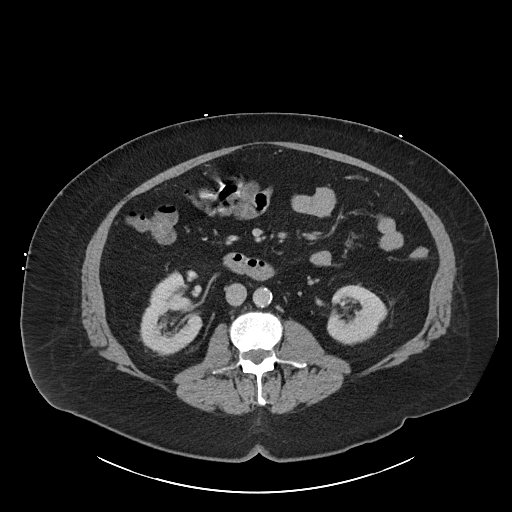
[im 64/100  soft-tissue]
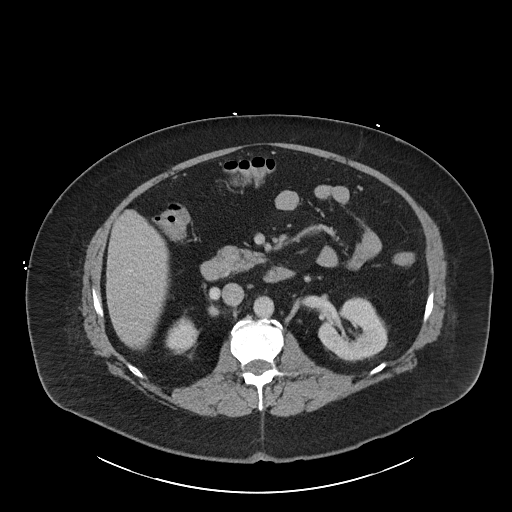
[im 64/100  bone]
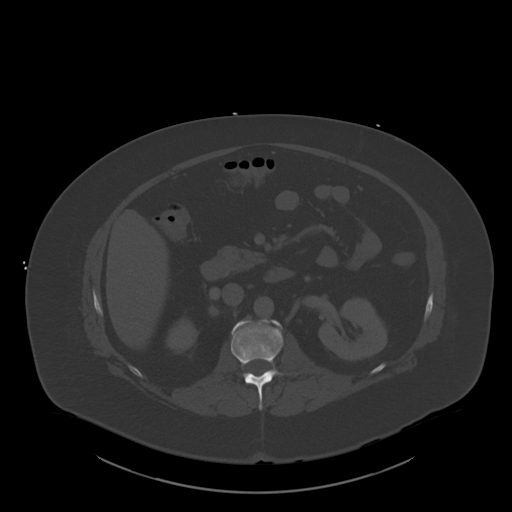
[im 71/100  soft-tissue]
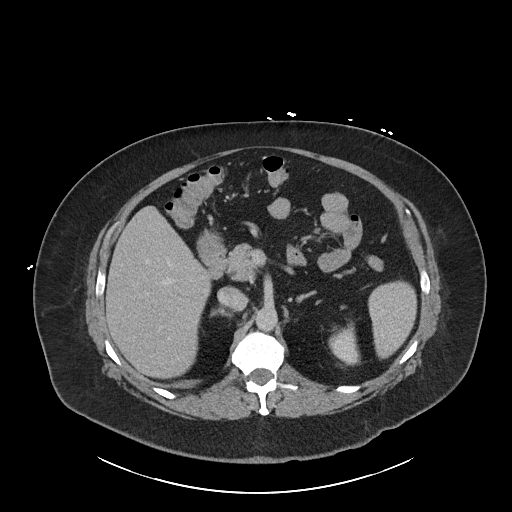
[im 78/100  soft-tissue]
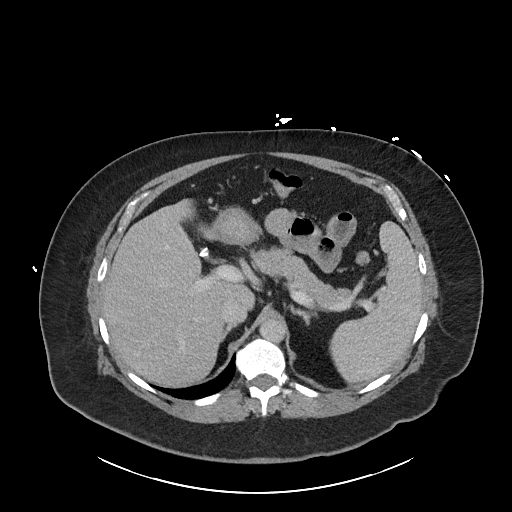
[im 85/100  soft-tissue]
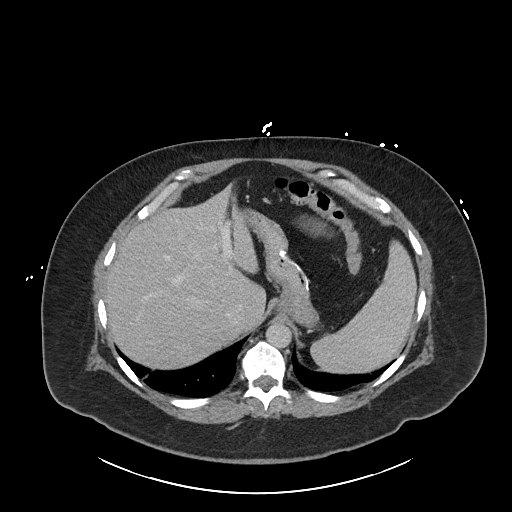
[im 92/100  soft-tissue]
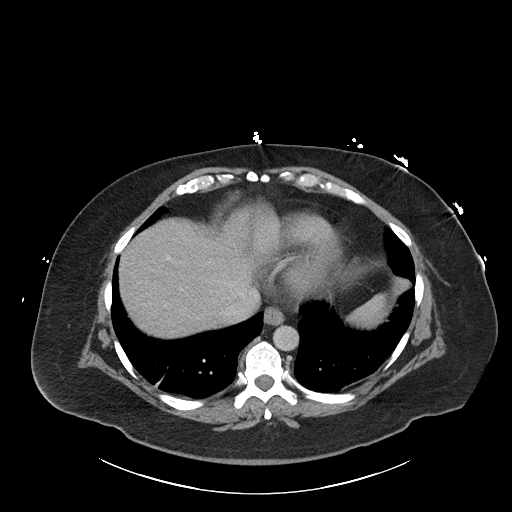

[Series 6: abdomen 3.0 mpr cor · coronal · 0.94mm/px · 3 of 116 slices shown]
[im 39/116  soft-tissue]
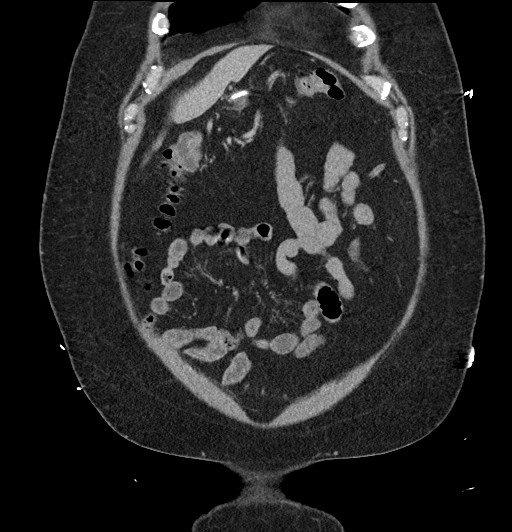
[im 52/116  soft-tissue]
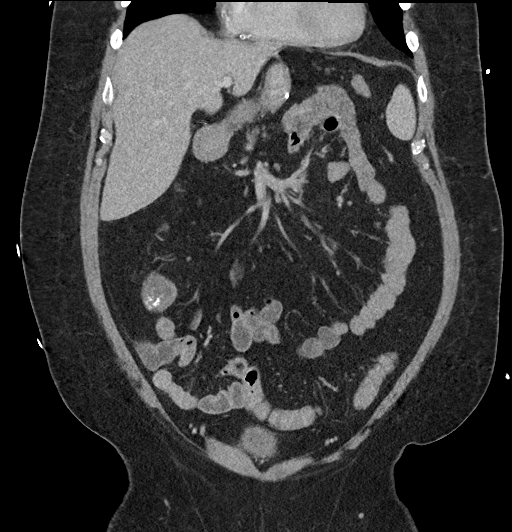
[im 64/116  soft-tissue]
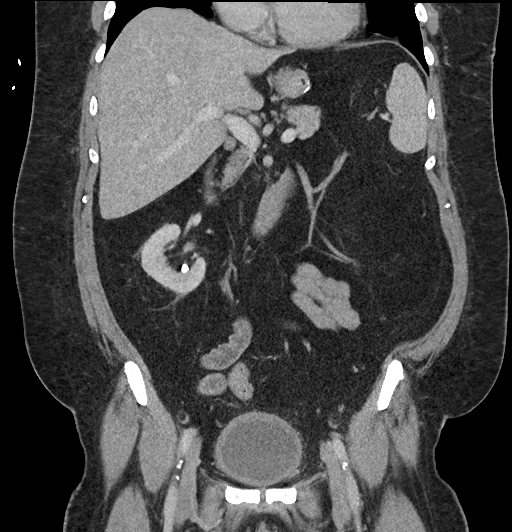

[16 of 46 positions shown; findings below may reference images not displayed]

FINDINGS: Lower chest: Lung bases are clear.

Hepatobiliary: No focal hepatic lesion. Postcholecystectomy. No
biliary dilatation.

Pancreas: Pancreas is normal. No ductal dilatation. No pancreatic
inflammation.

Spleen: Normal spleen

Adrenals/urinary tract: Adrenal glands normal. Bilateral
nonobstructing renal calculi. No ureterolithiasis or obstructive
uropathy. No bladder calculi.

Stomach/Bowel: Post gastric sleeve bariatric surgery. Duodenum and
small-bowel normal. Ascending and transverse colon normal. No bowel
obstruction. Rectosigmoid colon normal.

Vascular/Lymphatic: Abdominal aorta is normal caliber with
atherosclerotic calcification. There is no retroperitoneal or
periportal lymphadenopathy. No pelvic lymphadenopathy.

Reproductive: Lesion prostate gland is enlarged measuring 6.8 by
by 5.1 cm (volume = 100 cm^3)

Other: No free fluid.

Musculoskeletal: No aggressive osseous lesion.
IMPRESSION: 1. No acute findings in the abdomen pelvis.
2. Bilateral nonobstructing renal calculi.
3. No bowel obstruction or bowel inflammation.
4. Prostatomegaly.
5.  Aortic Atherosclerosis ([id number]).

## 2022-08-14 IMAGING — DX DG CHEST 1V PORT
1 series · 1 of 1 positions shown · non-contrast
Comparison: None.

CLINICAL DATA: Questionable sepsis; evaluate for abnormality

EXAM:
PORTABLE CHEST 1 VIEW

[chest ap]
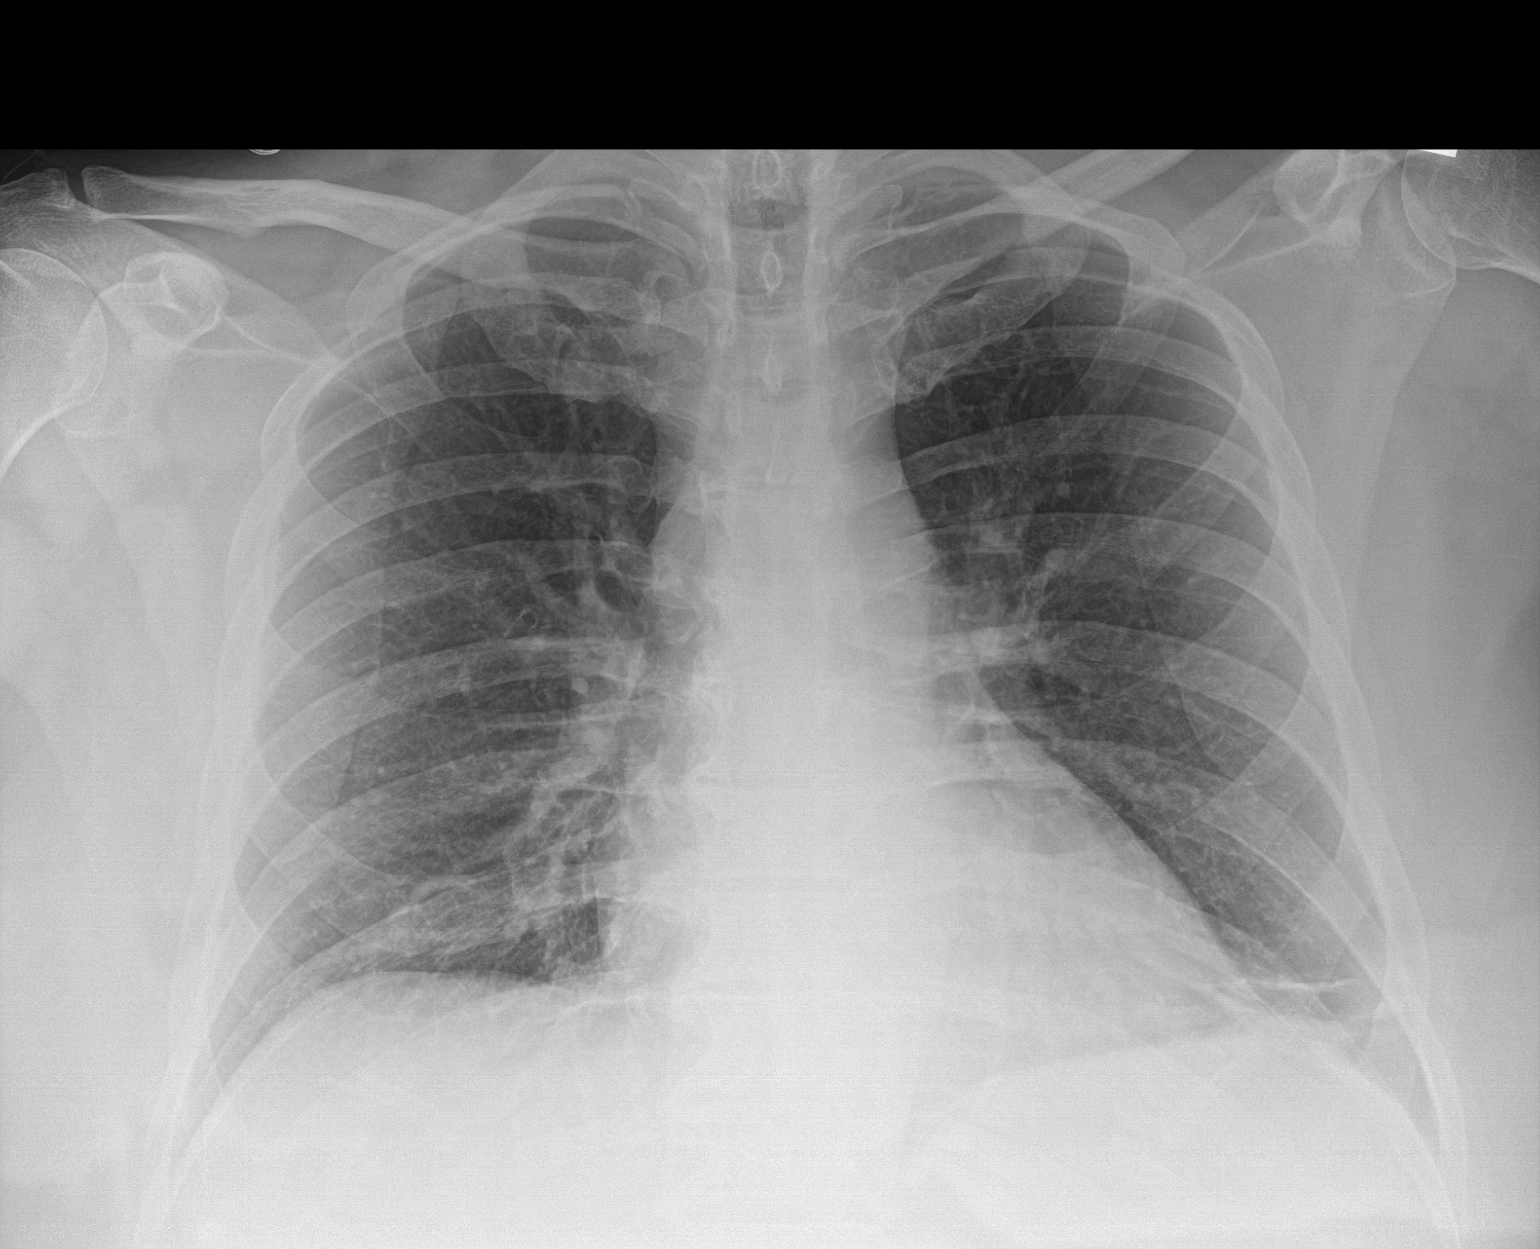

[1 of 1 positions shown; findings below may reference images not displayed]

FINDINGS: The heart size and mediastinal contours are within normal limits.

Linear atelectasis versus scarring at the left base. No focal
consolidation. No pulmonary edema. No pleural effusion. No
pneumothorax.

No acute osseous abnormality.
IMPRESSION: No active disease.

## 2022-08-17 IMAGING — DX DG CHEST 1V PORT
1 series · 1 of 1 positions shown · non-contrast
Comparison: December 16, 2020

CLINICAL DATA: Fever and cough

EXAM:
PORTABLE CHEST 1 VIEW

[chest]
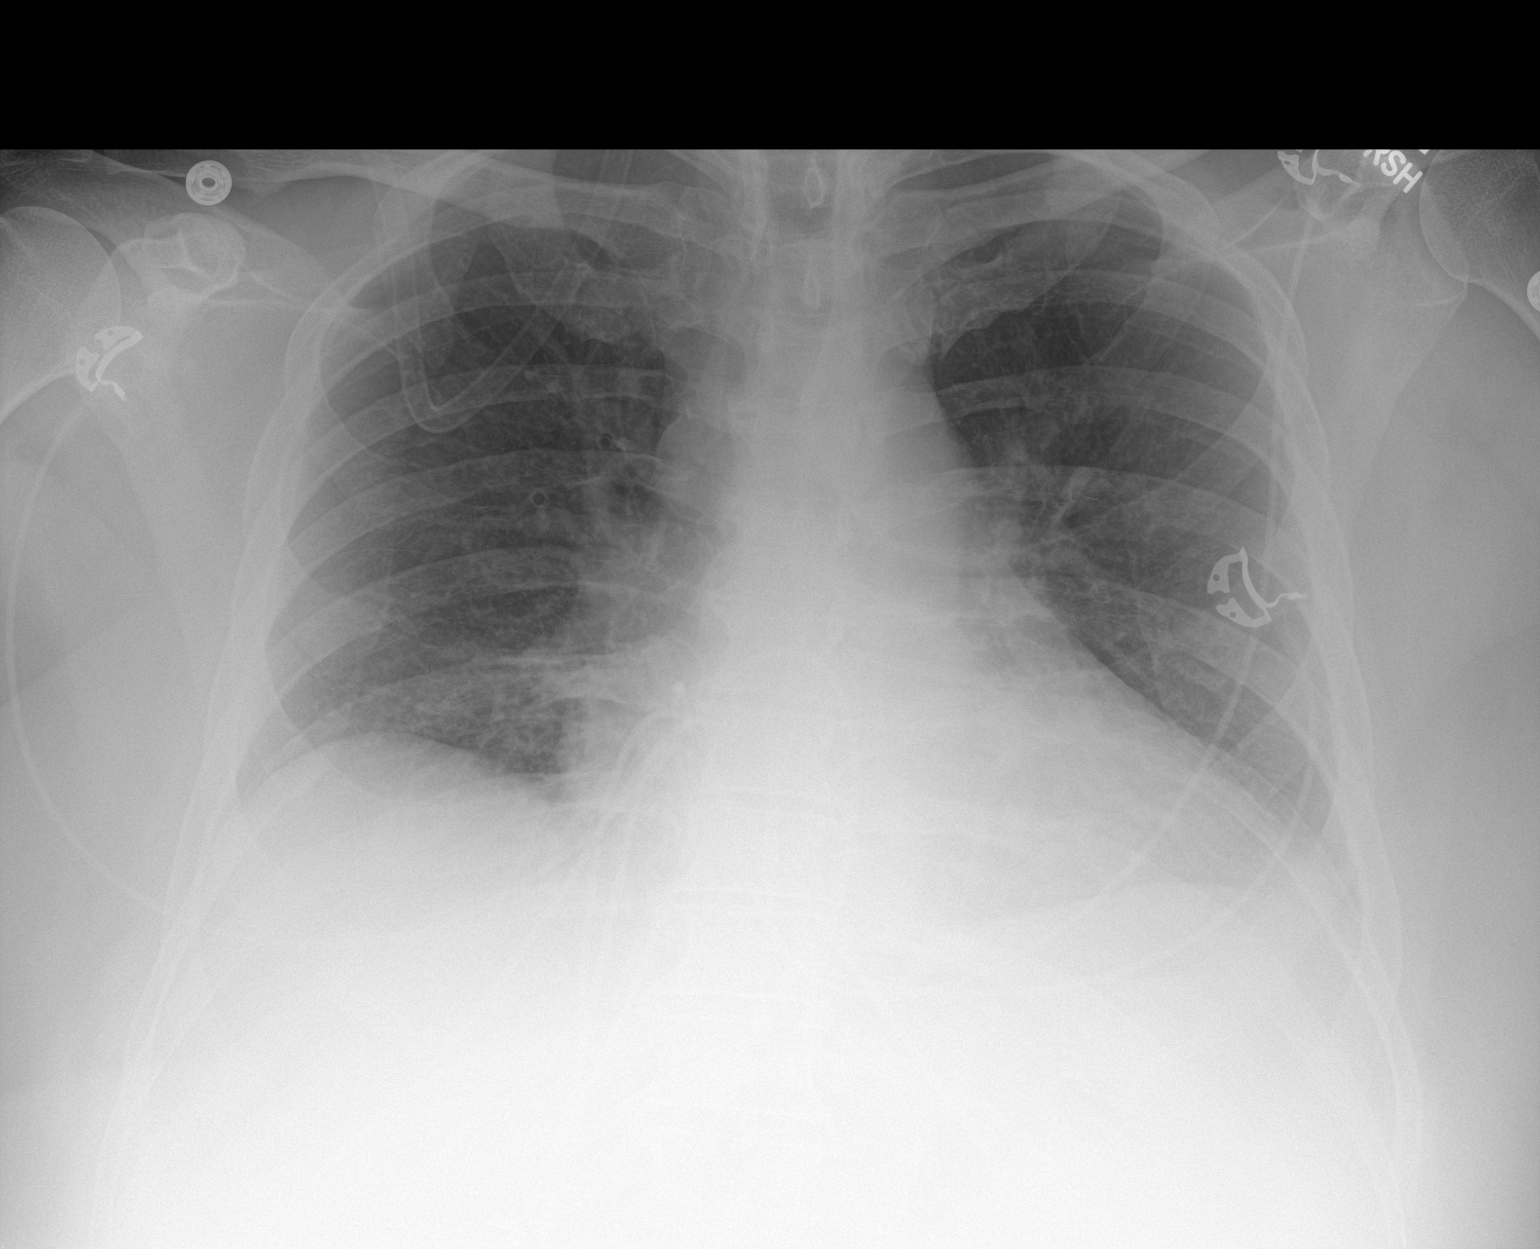

[1 of 1 positions shown; findings below may reference images not displayed]

FINDINGS: Lungs are clear. Heart is upper normal in size with pulmonary
vascularity normal. No adenopathy. No bone lesions.
IMPRESSION: Lungs clear.  Heart upper normal in size.

## 2022-10-04 NOTE — Progress Notes (Addendum)
 Back and abdomen pain penis pain today  Congestion cough 5 days  Pt denied covid or flu test

## 2023-01-08 NOTE — Patient Instructions (Signed)
Below is our plan:  We will continue gabapentin 600mg  daily.   Please make sure you are staying well hydrated. I recommend 50-60 ounces daily. Well balanced diet and regular exercise encouraged. Consistent sleep schedule with 6-8 hours recommended.   Please continue follow up with care team as directed.   Follow up with me if PCP unwilling to fill gabapentin   You may receive a survey regarding today's visit. I encourage you to leave honest feed back as I do use this information to improve patient care. Thank you for seeing me today!

## 2023-01-08 NOTE — Progress Notes (Addendum)
 PATIENT: Adam Ford DOB: 08-12-1961  REASON FOR VISIT: follow up HISTORY FROM: patient  Chief Complaint  Patient presents with   Room 2    Pt is here Alone. Pt states that things have been going good since last appointment.      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 He continues gabapentin  but decreased dose to 600mg  in am. He reports pain is well managed. He has tried to wean dose even further but pain returns. He continues to follow up regularly with Dr Paulene Boron, PCP with [facility].  BP has been well managed.   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 He continues gabapentin  900mg  BID-TID. He tries to skip lunch doses during the work week. He feels pain is well managed. He has occasional headaches. He feels sharper with decreased dose of gabapentin . He was seen by Dr Cheryll Corti for formal neurocog eval. Results suggested no concerns of neuro degenerative disease. He was advised to follow up as needed.   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 He was taking gabapentin  600mg  three times daily. He reports that head pain is well controlled. Gabapentin  was increased recently by GI for concerns of abdominal pain thought to be neuropathic in nature. He has follow up with them next month. He is tolerating 900mg  three times daily.    He reports concerns of memory loss and confusion at times. He is back to work full time. 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 He states that he wrote the report over three times as he did not remember witting it last week. He called a coworker yesterday to have the same conversation he had already had. He is able to drive without difficulty. He has no difficulty remembering where he has placed items or parked car. He is able to manage medications and finances independently. He performs ADLs independently. MRI was normal 12/2019. Mother has  AD.    12/20/2019 ALL:  Adam Ford is a 62 y.o. male here today for follow up for supraoccipital neuralgia. He continues gabapentin . He is taking 600mg  2-3 times daily. He does feel that gabapentin  helps as if he misses a dose, pain is increased. He continues to have sharp stabbing pain over the right eye.   Today, in the office, he is having difficulty speaking, difficulty breathing, reports a feeling that his abdomen is tight. He denies chest pain. He feels slow in reaction time and weak. Symptoms started about an hour ago. She does have a headache today but not out of proportion to other headaches. He feels better when he is able to stretch out. He reports having intermittent symptoms starting in 06/2019 but over the past month these symptoms have occurred multiple times daily and are intensifying. He denies obvious weakness. He was able to work today and drove to his appointment. He was seen for a similar event in 06/2019 but was not having difficulty speaking, reported difficulty getting deep breath and abdominal tightness. CT abdomen showed kidney stones. He was seen by PCP a couple of weeks ago following my recommendation via Mychart message for similar complaints and started on duloxetine. He did not feel it helped and discontinued this about a week ago.  He has not had recent head imaging since symptoms started. Last MR brain, MR angio and MR orbits unremarkable in 04/2018.    HISTORY: (copied from my note on 06/01/2019)  Adam Ford is a 62 y.o. male here today for follow up of suborbital headaches. He was started on amitriptyline  25mg  at bedtime at last visit in 04/2019. Unfortunately, he was unable to tolerate side effects. He has continues gabapentin  300mg  up to five times daily during the week and 600mg  three times daily on the weekends. He does feel a little sluggish with medication but feels headaches are stable. He continues to have intermittent pain about 1-2 times daily. Pain is just  over the right eye. It resolves in a few minutes to an hour. No other symptoms associated with pain.  It is much better on medication.    History (copied from my note on 04/22/2019)   Adam Ford is a 62 y.o. male here today for follow up of suborbital headaches. He has been taking gabapentin  300mg  in the am, 300mg  at lunch and 600mg  at bedtime. He reported grogginess with 600mg  TID dosing. He was doing well untl 3-4 days ago when headaches returned and are refractory to treatment. He has increased gabapentin  to 600mg  TID with an extra 300mg  dose around 5pm. He reports no changes in the headache other than this is more of a dull achy pain rather than sharp stabbing pain. He reports pain above his right eye as in the past. He has not noticed significant light or sound sensitivity but does report "feeling better when I wear my sunglasses." He denies nausea or vomiting. No vision changes. He reports that headache will spontaneously resolve with time.   HISTORY (copied from my note on 12/17/2018)   Adam Ford is a 62 y.o. male here today for follow up for supraoccipital neuralgia. He continues Gabapentin  600mg  up to 3 times a day. He has tried carbamazepine and topiramate in the past that caused side effects. He feels that gabapentin  is working better than anything. He does have concerns of feeling groggy with gabapentin . He has taken Lyrica in the past with similar side effects. He is trying to adjust his dose where he doesn't feel as sleepy.    He was seen for consideration of ablation therapy and nerve stimulator but he is not interested at this time.    HISTORY: (copied from Dr Harding Li note on 04/23/2018) HPI:  Adam Ford is a 62 y.o. male here as a referral from Dr. Randle Butler for eye pain rule out cluster headaches.  Past medical history anxiety, hypertension, high cholesterol. Back in February after the new year he had a regular eye check and a month later he had pain over his right eye and  headaches. Headaches worsened. He saw ophtho again Dr. Faylene Hoots and everything was fine. Dr. Tellis Feathers performed sinus procedures in February in the same timeframe the headaches started but unclear. Headaches worsening and a dull pain over the forehead. Mostly on the right, right at one point (points to the supratrochlear nerve). Every other week he gets a sharp strong pain he had to close his eye and squint and slow down, no lacrimation or other autonomic symptoms. Lasts briefly 10 minutes not stabbing. The pain above the right eyebrown is continuous all day long and the forehead is dull. He wake up with the headaches, alleve and tylenol . Ringing in the ears. Worsening.  No migrainous symptoms. Blurry.double vision right eye, can wake with the headaches and position makes it worse. No other focal neurologic deficits, associated symptoms, inciting events or modifiable factors.   Reviewed notes, labs and imaging from outside physicians, which showed:   Reviewed [facility]  care notes.  Patient was seen for an emergency visit.  Chief complaint was feeling weak and a sore sensation in the right eye for 3 weeks.  Headache above the right brow that last from several minutes to an hour.  Resolves on its own not currently on treatment.  Examination showed no APD, extraocular movements intact, external exam normal, intraocular pressure borderline, slit-lamp exam unremarkable, no optic nerve edema normal-appearing optic nerves, diagnosed with tension headache versus cluster headaches, pain in her around the eye, borderline glaucoma angle with borderline findings.    Months ago started having pain of her right eye went to eye doctor and underwent testing to determine eyes not the issue always a dull ache above his right eye once a week or every other week he will have worsened shooting pain, starting to have low-grade pain over both eyes he takes 2 Aleve's and 2 Tylenols twice daily also for bulging disc if he does not take  those the pain is worse.   REVIEW OF SYSTEMS: Out of a complete 14 system review of symptoms, the patient complains only of the following symptoms, see hpi and all other reviewed systems are negative.  ALLERGIES: Allergies  Allergen Reactions   Bee Venom Anaphylaxis    HOME MEDICATIONS: Outpatient Medications Prior to Visit  Medication Sig Dispense Refill   amLODipine -benazepril  (LOTREL) 10-40 MG capsule Take 1 capsule by mouth at bedtime.     Cholecalciferol  (VITAMIN D3) 50 MCG (2000 UT) capsule Take 2,000 Units by mouth at bedtime.     dicyclomine  (BENTYL ) 20 MG tablet Take 10-20 mg by mouth daily as needed.     docusate sodium  (COLACE) 100 MG capsule Take 1 capsule (100 mg total) by mouth daily as needed for up to 30 doses. 30 capsule 0   hydrochlorothiazide (HYDRODIURIL) 25 MG tablet Take 25 mg by mouth daily.     methocarbamol (ROBAXIN) 500 MG tablet Take 500 mg by mouth every 6 (six) hours as needed.     metoprolol  succinate (TOPROL -XL) 50 MG 24 hr tablet Take 100 mg by mouth at bedtime.     Multiple Vitamins-Minerals (CENTRUM SILVER 50+MEN PO) Take 1 tablet by mouth at bedtime.     Potassium Citrate 15 MEQ (1620 MG) TBCR Take 1 tablet by mouth 2 (two) times daily.     rosuvastatin  (CRESTOR ) 10 MG tablet Take 10 mg by mouth at bedtime.     Semaglutide (OZEMPIC, 0.25 OR 0.5 MG/DOSE, [place]) Inject 0.5 mg into the skin once a week.     tamsulosin  (FLOMAX ) 0.4 MG CAPS capsule Take 1 capsule (0.4 mg total) by mouth daily. 30 capsule 1   testosterone cypionate (DEPOTESTOSTERONE CYPIONATE) 200 MG/ML injection Inject 0.75 mg into the muscle every 14 (fourteen) days.     gabapentin  (NEURONTIN ) 300 MG capsule Take 3 capsules (900 mg total) by mouth 2 (two) times daily. 180 capsule 11   metFORMIN (GLUCOPHAGE-XR) 500 MG 24 hr tablet Take 500 mg by mouth daily with supper. (Patient not taking: Reported on 01/13/2023)     oxyCODONE -acetaminophen  (PERCOCET) 5-325 MG tablet Take 1 tablet by mouth every  4 (four) hours as needed for up to 18 doses for severe pain. (Patient not taking: Reported on 01/13/2023) 18 tablet 0   No facility-administered medications prior to visit.    PAST MEDICAL HISTORY: Past Medical History:  Diagnosis Date   Anxiety    Blepharitis, both eyes    Bulging of thoracic intervertebral disc    T8  Cancer    basal cell nose   Glaucoma, both eyes    per pt no eye drops ,   Headache    with neuralgia   History of basal cell carcinoma (BCC) excision    s/p moh's surgery of nose in 2020   History of kidney stones    History of sepsis 12/16/2020   hospital admission in epic, secondary to UTI with ecoli and pyelonephritis   Hypercholesteremia    Hypertension    followed by pcp   OSA on CPAP    Pneumonia    PONV (postoperative nausea and vomiting)    Renal calculi    bilateral non-obstructive per abd imaging 02-19-2021   Supraorbital neuralgia    neurologist--- dr Tresia Fruit----  mostly right side, takes gabapentin    Type 2 diabetes mellitus    followed by pcp  ---  (03-01-2021 pt stated checks blood sugar once wkly one hour after eats,  average 130--140)   Wears glasses     PAST SURGICAL HISTORY: Past Surgical History:  Procedure Laterality Date   APPENDECTOMY  1969   CYST EXCISION Left    hip   CYSTOSCOPY/URETEROSCOPY/HOLMIUM LASER/STENT PLACEMENT  last one 2012 approx.   CYSTOSCOPY/URETEROSCOPY/HOLMIUM LASER/STENT PLACEMENT Right 03/05/2021   Procedure: CYSTOSCOPY/RETROGRADE/URETEROSCOPY/STENT PLACEMENT;  Surgeon: Lahoma Pigg, MD;  Location: [facility];  Service: Urology;  Laterality: Right;   CYSTOSCOPY/URETEROSCOPY/HOLMIUM LASER/STENT PLACEMENT Right 04/27/2021   Procedure: CYSTOSCOPY/RETROGRADE/URETEROSCOPY/HOLMIUM LASER/STENT PLACEMENT;  Surgeon: Lahoma Pigg, MD;  Location: [facility];  Service: Urology;  Laterality: Right;   CYSTOSCOPY/URETEROSCOPY/HOLMIUM LASER/STENT PLACEMENT Left 06/04/2021   Procedure:  CYSTOSCOPY/RETROGRADE/URETEROSCOPY/HOLMIUM LASER/STENT PLACEMENT;  Surgeon: Lahoma Pigg, MD;  Location: [facility];  Service: Urology;  Laterality: Left;  ONLY NEEDS 60 MIN   EXTRACORPOREAL SHOCK WAVE LITHOTRIPSY  last one 2012  approx.   GASTRIC SLEEVE  2017 approx   LAPAROSCOPIC CHOLECYSTECTOMY  2008 approx   lasers yag iridotomy Bilateral    LIPOMA EXCISION Left 2020   Ear Dr. Tellis Feathers   MOHS SURGERY  2020   nose   NASAL TURBINATE REDUCTION  2018   NASAL VALVE COLLAPSE REPAIR  2019   TONSILLECTOMY  child   TRABECULECTOMY Bilateral 2019   w/  yag laser   VASECTOMY      FAMILY HISTORY: Family History  Problem Relation Age of Onset   Hypertension Mother    Hypertension Father    Aneurysm Father    Colon cancer Maternal Grandmother    Stroke Paternal Grandfather    Stroke Other    High Cholesterol Other        on both sides   Other Neg Hx     SOCIAL HISTORY: Social History   Socioeconomic History   Marital status: Married    Spouse name: Not on file   Number of children: 4   Years of education: Not on file   Highest education level: Master's degree (e.g., MA, MS, MEng, MEd, MSW, MBA)  Occupational History   Not on file  Tobacco Use   Smoking status: Never   Smokeless tobacco: Never  Vaping Use   Vaping Use: Never used  Substance and Sexual Activity   Alcohol use: Never   Drug use: Never   Sexual activity: Not on file    Comment: VASECTOMY  Other Topics Concern   Not on file  Social History Narrative   Lives at home with his wife & children   Right handed   Caffeine: quit in 2015 but now  drinks diet soda which helps his headaches. He drinks about 6-7 cans during the work days.    Social Determinants of Health   Financial Resource Strain: Not on file  Food Insecurity: Not on file  Transportation Needs: Not on file  Physical Activity: Not on file  Stress: Not on file  Social Connections: Not on file  Intimate Partner Violence: Not on file     PHYSICAL  EXAM  Vitals:   01/13/23 1348  BP: (!) 140/81  Pulse: 78  Weight: 273 lb (123.8 kg)  Height: 5\' 11"  (1.803 m)     Body mass index is 38.08 kg/m.  Generalized: Well developed Cardiology: normal rate and rhythm, no murmur noted Respiratory: clear to auscultation bilaterally Neurological examination  Mentation: Alert, oriented to person and place.  Cranial nerve II-XII: Pupils were equal round reactive to light. Extraocular movements were full, visual field were full on confrontational test. Facial sensation and strength were normal. Uvula tongue midline. Head turning and shoulder shrug  were normal and symmetric. Motor: The motor testing reveals 5 over 5 strength of all 4 extremities. Good symmetric motor tone is noted throughout.  Sensory: Sensory testing is intact to soft touch on all 4 extremities. No evidence of extinction is noted.  Coordination: Cerebellar testing reveals good finger-nose-finger and heel-to-shin bilaterally.  Gait and station: Pushes to standing position without difficulty. Gait is normal. No assistive device used.     DIAGNOSTIC DATA (LABS, IMAGING, TESTING) - I reviewed patient records, labs, notes, testing and imaging myself where available.      No data to display           Lab Results  Component Value Date   WBC 5.6 05/23/2021   HGB 15.6 05/23/2021   HCT 46.3 05/23/2021   MCV 83.9 05/23/2021   PLT 215 05/23/2021      Component Value Date/Time   NA 140 05/23/2021 0830   NA 142 04/23/2018 0902   K 3.6 05/23/2021 0830   CL 102 05/23/2021 0830   CO2 29 05/23/2021 0830   GLUCOSE 133 (H) 05/23/2021 0830   BUN 20 05/23/2021 0830   BUN 18 04/23/2018 0902   CREATININE 1.18 05/23/2021 0830   CALCIUM  9.5 05/23/2021 0830   PROT 6.1 (L) 12/18/2020 0741   PROT 7.0 04/23/2018 0902   ALBUMIN 2.7 (L) 12/18/2020 0741   ALBUMIN 4.5 04/23/2018 0902   AST 17 12/18/2020 0741   ALT 15 12/18/2020 0741   ALKPHOS 66 12/18/2020 0741   BILITOT 1.1  12/18/2020 0741   BILITOT 0.8 04/23/2018 0902   GFRNONAA >60 05/23/2021 0830   GFRAA >60 12/20/2019 1851   No results found for: "CHOL", "HDL", "LDLCALC", "LDLDIRECT", "TRIG", "CHOLHDL" Lab Results  Component Value Date   HGBA1C 6.5 (H) 04/13/2021   No results found for: "VITAMINB12" Lab Results  Component Value Date   TSH 0.956 04/23/2018    ASSESSMENT AND PLAN 63 y.o. year old male  has a past medical history of Anxiety, Blepharitis, both eyes, Bulging of thoracic intervertebral disc, Cancer, Glaucoma, both eyes, Headache, History of basal cell carcinoma (BCC) excision, History of kidney stones, History of sepsis (12/16/2020), Hypercholesteremia, Hypertension, OSA on CPAP, Pneumonia, PONV (postoperative nausea and vomiting), Renal calculi, Supraorbital neuralgia, Type 2 diabetes mellitus, and Wears glasses. here with     ICD-10-CM   1. 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 He is doing well on gabapentin . He  is taking 600mg  in the mornings. Rx updated to reflect current dose. Continue close follow up with care team. Continue CPAP and PCP follow up. May follow up with PCP for gabapentin  refills. He verbalizes understanding and agreement with plan.   No orders of the defined types were placed in this encounter.    Meds ordered this encounter  Medications   gabapentin  (NEURONTIN ) 300 MG capsule    Sig: Take 2 capsules (600 mg total) by mouth daily.    Dispense:  180 capsule    Refill:  3    Will call when refill is needed.    Order Specific Question:   Supervising Provider    Answer:   Glory Larsen [[id number]]      Terrilyn Fick, FNP-C 01/13/2023, 2:20 PM [facility] [address], [address] [place], [place] [address] [phone]

## 2023-01-13 ENCOUNTER — Encounter: Payer: Self-pay | Admitting: Family Medicine

## 2023-01-13 ENCOUNTER — Ambulatory Visit: Payer: 59 | Admitting: Family Medicine

## 2023-01-13 VITALS — BP 140/81 | HR 78 | Ht 71.0 in | Wt 273.0 lb

## 2023-01-13 DIAGNOSIS — G500 Trigeminal neuralgia: Secondary | ICD-10-CM

## 2023-01-13 MED ORDER — GABAPENTIN 300 MG PO CAPS: 600.0000 mg | ORAL_CAPSULE | Freq: Every day | ORAL | 3 refills | Status: DC

## 2023-01-16 ENCOUNTER — Ambulatory Visit: Payer: 59 | Admitting: Family Medicine

## 2023-09-24 ENCOUNTER — Other Ambulatory Visit: Payer: Self-pay | Admitting: Physical Medicine and Rehabilitation

## 2023-09-24 DIAGNOSIS — M546 Pain in thoracic spine: Secondary | ICD-10-CM

## 2023-10-16 NOTE — Discharge Instructions (Signed)

## 2023-10-17 ENCOUNTER — Ambulatory Visit
Admission: RE | Admit: 2023-10-17 | Discharge: 2023-10-17 | Disposition: A | Discharge status: Home / Self Care | Payer: 59 | Source: Ambulatory Visit | Attending: Physical Medicine and Rehabilitation | Admitting: Physical Medicine and Rehabilitation

## 2023-10-17 DIAGNOSIS — M546 Pain in thoracic spine: Secondary | ICD-10-CM

## 2023-10-17 MED ORDER — IOPAMIDOL (ISOVUE-M 300) INJECTION 61%
1.0000 mL | Freq: Once | INTRAMUSCULAR | Status: AC
  Administered 2023-10-17: 1 mL via INTRA_ARTICULAR

## 2023-10-17 MED ORDER — TRIAMCINOLONE ACETONIDE 40 MG/ML IJ SUSP (RADIOLOGY)
60.0000 mg | Freq: Once | INTRAMUSCULAR | Status: AC
  Administered 2023-10-17: 60 mg via INTRA_ARTICULAR

## 2023-11-04 ENCOUNTER — Other Ambulatory Visit: Payer: Self-pay | Admitting: Physical Medicine and Rehabilitation

## 2023-11-04 ENCOUNTER — Other Ambulatory Visit: Payer: Self-pay

## 2023-11-04 DIAGNOSIS — M546 Pain in thoracic spine: Secondary | ICD-10-CM

## 2023-11-15 ENCOUNTER — Ambulatory Visit
Admission: RE | Admit: 2023-11-15 | Discharge: 2023-11-15 | Disposition: A | Discharge status: Home / Self Care | Payer: 59 | Source: Ambulatory Visit | Attending: Physical Medicine and Rehabilitation | Admitting: Physical Medicine and Rehabilitation

## 2023-11-15 DIAGNOSIS — M546 Pain in thoracic spine: Secondary | ICD-10-CM

## 2023-11-25 ENCOUNTER — Other Ambulatory Visit: Payer: Self-pay | Admitting: Physical Medicine and Rehabilitation

## 2023-11-25 DIAGNOSIS — M546 Pain in thoracic spine: Secondary | ICD-10-CM

## 2023-12-04 NOTE — Discharge Instructions (Signed)

## 2023-12-05 ENCOUNTER — Inpatient Hospital Stay
Admission: RE | Admit: 2023-12-05 | Discharge: 2023-12-05 | Disposition: A | Discharge status: Home / Self Care | Payer: 59 | Source: Ambulatory Visit | Attending: Physical Medicine and Rehabilitation | Admitting: Physical Medicine and Rehabilitation

## 2023-12-08 NOTE — Discharge Instructions (Signed)

## 2023-12-09 ENCOUNTER — Ambulatory Visit
Admission: RE | Admit: 2023-12-09 | Discharge: 2023-12-09 | Disposition: A | Discharge status: Home / Self Care | Payer: 59 | Source: Ambulatory Visit | Attending: Physical Medicine and Rehabilitation | Admitting: Physical Medicine and Rehabilitation

## 2023-12-09 DIAGNOSIS — M546 Pain in thoracic spine: Secondary | ICD-10-CM

## 2023-12-09 MED ORDER — TRIAMCINOLONE ACETONIDE 40 MG/ML IJ SUSP (RADIOLOGY)
60.0000 mg | Freq: Once | INTRAMUSCULAR | Status: AC
  Administered 2023-12-09: 60 mg via EPIDURAL

## 2023-12-09 MED ORDER — IOPAMIDOL (ISOVUE-M 300) INJECTION 61%
1.0000 mL | Freq: Once | INTRAMUSCULAR | Status: AC | PRN
  Administered 2023-12-09: 1 mL via EPIDURAL

## 2024-01-15 ENCOUNTER — Other Ambulatory Visit: Payer: Self-pay | Admitting: *Deleted

## 2024-01-15 ENCOUNTER — Ambulatory Visit: Payer: 59 | Admitting: Family Medicine

## 2024-01-15 MED ORDER — GABAPENTIN 300 MG PO CAPS: 600.0000 mg | ORAL_CAPSULE | Freq: Every day | ORAL | 0 refills | Status: DC

## 2024-02-24 NOTE — Progress Notes (Unsigned)
 PATIENT: Adam Ford DOB: 04-30-1961  REASON FOR VISIT: follow up HISTORY FROM: patient  No chief complaint on file.    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 Adam Ford continues gabapentin  600mg  daily.   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 Adam Ford continues gabapentin  but decreased dose to 600mg  in am. Adam Ford reports pain is well managed. Adam Ford has tried to wean dose even further but pain Ford. Adam Ford continues to follow up regularly with Dr Paulene Boron, PCP with [facility].  BP has been well managed.   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 Adam Ford continues gabapentin  900mg  BID-TID. Adam Ford tries to skip lunch doses during the work week. Adam Ford feels pain is well managed. Adam Ford has occasional headaches. Adam Ford feels sharper with decreased dose of gabapentin . Adam Ford was seen by Dr Cheryll Corti for formal neurocog eval. Results suggested no concerns of neuro degenerative disease. Adam Ford was advised to follow up as needed.   01/10/2021 ALL:  Adam Ford Ford for follow up for supraoccipital neuralgia. Adam Ford was taking gabapentin  600mg  three times daily. Adam Ford reports that head pain is well controlled. Gabapentin  was increased recently by GI for concerns of abdominal pain thought to be neuropathic in nature. Adam Ford has follow up with them next month. Adam Ford is tolerating 900mg  three times daily.    Adam Ford reports concerns of memory loss and confusion at times. Adam Ford is back to work full time. Adam Ford was witting a report last week for work. Adam Ford states that Adam Ford wrote the report over three times as Adam Ford did not remember witting it last week. Adam Ford called a coworker yesterday to have the same conversation Adam Ford had already had. Adam Ford is able to drive without difficulty. Adam Ford has no difficulty remembering where Adam Ford has placed items or parked car. Adam Ford is able to manage medications and finances independently. Adam Ford performs ADLs independently. MRI was normal 12/2019. Mother has  AD.    12/20/2019 ALL:  Adam Ford is a 63 y.o. male here today for follow up for supraoccipital neuralgia. Adam Ford continues gabapentin . Adam Ford is taking 600mg  2-3 times daily. Adam Ford does feel that gabapentin  helps as if Adam Ford misses a dose, pain is increased. Adam Ford continues to have sharp stabbing pain over the right eye.   Today, in the office, Adam Ford is having difficulty speaking, difficulty breathing, reports a feeling that his abdomen is tight. Adam Ford denies chest pain. Adam Ford feels slow in reaction time and weak. Symptoms started about an hour ago. She does have a headache today but not out of proportion to other headaches. Adam Ford feels better when Adam Ford is able to stretch out. Adam Ford reports having intermittent symptoms starting in 06/2019 but over the past month these symptoms have occurred multiple times daily and are intensifying. Adam Ford denies obvious weakness. Adam Ford was able to work today and drove to his appointment. Adam Ford was seen for a similar event in 06/2019 but was not having difficulty speaking, reported difficulty getting deep breath and abdominal tightness. CT abdomen showed kidney stones. Adam Ford was seen by PCP a couple of weeks ago following my recommendation via Mychart message for similar complaints and started on duloxetine. Adam Ford did not feel it helped and discontinued this about a week ago.  Adam Ford has not had recent head imaging since symptoms started. Last MR brain, MR angio and MR orbits unremarkable in 04/2018.    HISTORY: (copied from my note on 06/01/2019)  Adam Ford is a 63 y.o. male  here today for follow up of suborbital headaches. Adam Ford was started on amitriptyline  25mg  at bedtime at last visit in 04/2019. Unfortunately, Adam Ford was unable to tolerate side effects. Adam Ford has continues gabapentin  300mg  up to five times daily during the week and 600mg  three times daily on the weekends. Adam Ford does feel a little sluggish with medication but feels headaches are stable. Adam Ford continues to have intermittent pain about 1-2 times daily. Pain is just  over the right eye. It resolves in a few minutes to an hour. No other symptoms associated with pain.  It is much better on medication.    History (copied from my note on 04/22/2019)   Adam Ford is a 63 y.o. male here today for follow up of suborbital headaches. Adam Ford has been taking gabapentin  300mg  in the am, 300mg  at lunch and 600mg  at bedtime. Adam Ford reported grogginess with 600mg  TID dosing. Adam Ford was doing well untl 3-4 days ago when headaches returned and are refractory to treatment. Adam Ford has increased gabapentin  to 600mg  TID with an extra 300mg  dose around 5pm. Adam Ford reports no changes in the headache other than this is more of a dull achy pain rather than sharp stabbing pain. Adam Ford reports pain above his right eye as in the past. Adam Ford has not noticed significant light or sound sensitivity but does report "feeling better when I wear my sunglasses." Adam Ford denies nausea or vomiting. No vision changes. Adam Ford reports that headache will spontaneously resolve with time.   HISTORY (copied from my note on 12/17/2018)   Adam Ford is a 63 y.o. male here today for follow up for supraoccipital neuralgia. Adam Ford continues Gabapentin  600mg  up to 3 times a day. Adam Ford has tried carbamazepine and topiramate in the past that caused side effects. Adam Ford feels that gabapentin  is working better than anything. Adam Ford does have concerns of feeling groggy with gabapentin . Adam Ford has taken Lyrica in the past with similar side effects. Adam Ford is trying to adjust his dose where Adam Ford doesn't feel as sleepy.    Adam Ford was seen for consideration of ablation therapy and nerve stimulator but Adam Ford is not interested at this time.    HISTORY: (copied from Dr Harding Li note on 04/23/2018) HPI:  Adam Ford is a 63 y.o. male here as a referral from Dr. Randle Butler for eye pain rule out cluster headaches.  Past medical history anxiety, hypertension, high cholesterol. Back in February after the new year Adam Ford had a regular eye check and a month later Adam Ford had pain over his right eye and  headaches. Headaches worsened. Adam Ford saw ophtho again Dr. Faylene Hoots and everything was fine. Dr. Tellis Feathers performed sinus procedures in February in the same timeframe the headaches started but unclear. Headaches worsening and a dull pain over the forehead. Mostly on the right, right at one point (points to the supratrochlear nerve). Every other week Adam Ford gets a sharp strong pain Adam Ford had to close his eye and squint and slow down, no lacrimation or other autonomic symptoms. Lasts briefly 10 minutes not stabbing. The pain above the right eyebrown is continuous all day long and the forehead is dull. Adam Ford wake up with the headaches, alleve and tylenol . Ringing in the ears. Worsening.  No migrainous symptoms. Blurry.double vision right eye, can wake with the headaches and position makes it worse. No other focal neurologic deficits, associated symptoms, inciting events or modifiable factors.   Reviewed notes, labs and imaging from outside physicians, which showed:   Reviewed [facility].  Patient was seen for  an emergency visit.  Chief complaint was feeling weak and a sore sensation in the right eye for 3 weeks.  Headache above the right brow that last from several minutes to an hour.  Resolves on its own not currently on treatment.  Examination showed no APD, extraocular movements intact, external exam normal, intraocular pressure borderline, slit-lamp exam unremarkable, no optic nerve edema normal-appearing optic nerves, diagnosed with tension headache versus cluster headaches, pain in her around the eye, borderline glaucoma angle with borderline findings.    Months ago started having pain of her right eye went to eye doctor and underwent testing to determine eyes not the issue always a dull ache above his right eye once a week or every other week Adam Ford will have worsened shooting pain, starting to have low-grade pain over both eyes Adam Ford takes 2 Aleve's and 2 Tylenols twice daily also for bulging disc if Adam Ford does not take  those the pain is worse.   REVIEW OF SYSTEMS: Out of a complete 14 system review of symptoms, the patient complains only of the following symptoms, see hpi and all other reviewed systems are negative.  ALLERGIES: Allergies  Allergen Reactions   Bee Venom Anaphylaxis    HOME MEDICATIONS: Outpatient Medications Prior to Visit  Medication Sig Dispense Refill   amLODipine -benazepril  (LOTREL) 10-40 MG capsule Take 1 capsule by mouth at bedtime.     Cholecalciferol  (VITAMIN D3) 50 MCG (2000 UT) capsule Take 2,000 Units by mouth at bedtime.     dicyclomine  (BENTYL ) 20 MG tablet Take 10-20 mg by mouth daily as needed.     docusate sodium  (COLACE) 100 MG capsule Take 1 capsule (100 mg total) by mouth daily as needed for up to 30 doses. 30 capsule 0   gabapentin  (NEURONTIN ) 300 MG capsule Take 2 capsules (600 mg total) by mouth daily. 180 capsule 0   hydrochlorothiazide (HYDRODIURIL) 25 MG tablet Take 25 mg by mouth daily.     metFORMIN (GLUCOPHAGE-XR) 500 MG 24 hr tablet Take 500 mg by mouth daily with supper. (Patient not taking: Reported on 01/13/2023)     methocarbamol (ROBAXIN) 500 MG tablet Take 500 mg by mouth every 6 (six) hours as needed.     metoprolol  succinate (TOPROL -XL) 50 MG 24 hr tablet Take 100 mg by mouth at bedtime.     Multiple Vitamins-Minerals (CENTRUM SILVER 50+MEN PO) Take 1 tablet by mouth at bedtime.     oxyCODONE -acetaminophen  (PERCOCET) 5-325 MG tablet Take 1 tablet by mouth every 4 (four) hours as needed for up to 18 doses for severe pain. (Patient not taking: Reported on 01/13/2023) 18 tablet 0   Potassium Citrate 15 MEQ (1620 MG) TBCR Take 1 tablet by mouth 2 (two) times daily.     rosuvastatin  (CRESTOR ) 10 MG tablet Take 10 mg by mouth at bedtime.     Semaglutide (OZEMPIC, 0.25 OR 0.5 MG/DOSE, [place]) Inject 0.5 mg into the skin once a week.     tamsulosin  (FLOMAX ) 0.4 MG CAPS capsule Take 1 capsule (0.4 mg total) by mouth daily. 30 capsule 1   testosterone cypionate  (DEPOTESTOSTERONE CYPIONATE) 200 MG/ML injection Inject 0.75 mg into the muscle every 14 (fourteen) days.     No facility-administered medications prior to visit.    PAST MEDICAL HISTORY: Past Medical History:  Diagnosis Date   Anxiety    Blepharitis, both eyes    Bulging of thoracic intervertebral disc    T8   Cancer (HCC)    basal cell nose  Glaucoma, both eyes    per pt no eye drops ,   Headache    with neuralgia   History of basal cell carcinoma (BCC) excision    s/p moh's surgery of nose in 2020   History of kidney stones    History of sepsis 12/16/2020   hospital admission in epic, secondary to UTI with ecoli and pyelonephritis   Hypercholesteremia    Hypertension    followed by pcp   OSA on CPAP    Pneumonia    PONV (postoperative nausea and vomiting)    Renal calculi    bilateral non-obstructive per abd imaging 02-19-2021   Supraorbital neuralgia    neurologist--- dr Tresia Fruit----  mostly right side, takes gabapentin    Type 2 diabetes mellitus (HCC)    followed by pcp  ---  (03-01-2021 pt stated checks blood sugar once wkly one hour after eats,  average 130--140)   Wears glasses     PAST SURGICAL HISTORY: Past Surgical History:  Procedure Laterality Date   APPENDECTOMY  1969   CYST EXCISION Left    hip   CYSTOSCOPY/URETEROSCOPY/HOLMIUM LASER/STENT PLACEMENT  last one 2012 approx.   CYSTOSCOPY/URETEROSCOPY/HOLMIUM LASER/STENT PLACEMENT Right 03/05/2021   Procedure: CYSTOSCOPY/RETROGRADE/URETEROSCOPY/STENT PLACEMENT;  Surgeon: Lahoma Pigg, MD;  Location: [facility];  Service: Urology;  Laterality: Right;   CYSTOSCOPY/URETEROSCOPY/HOLMIUM LASER/STENT PLACEMENT Right 04/27/2021   Procedure: CYSTOSCOPY/RETROGRADE/URETEROSCOPY/HOLMIUM LASER/STENT PLACEMENT;  Surgeon: Lahoma Pigg, MD;  Location: [facility];  Service: Urology;  Laterality: Right;   CYSTOSCOPY/URETEROSCOPY/HOLMIUM LASER/STENT PLACEMENT Left 06/04/2021   Procedure:  CYSTOSCOPY/RETROGRADE/URETEROSCOPY/HOLMIUM LASER/STENT PLACEMENT;  Surgeon: Lahoma Pigg, MD;  Location: [facility];  Service: Urology;  Laterality: Left;  ONLY NEEDS 60 MIN   EXTRACORPOREAL SHOCK WAVE LITHOTRIPSY  last one 2012  approx.   GASTRIC SLEEVE  2017 approx   LAPAROSCOPIC CHOLECYSTECTOMY  2008 approx   lasers yag iridotomy Bilateral    LIPOMA EXCISION Left 2020   Ear Dr. Tellis Feathers   MOHS SURGERY  2020   nose   NASAL TURBINATE REDUCTION  2018   NASAL VALVE COLLAPSE REPAIR  2019   TONSILLECTOMY  child   TRABECULECTOMY Bilateral 2019   w/  yag laser   VASECTOMY      FAMILY HISTORY: Family History  Problem Relation Age of Onset   Hypertension Mother    Hypertension Father    Aneurysm Father    Colon cancer Maternal Grandmother    Stroke Paternal Grandfather    Stroke Other    High Cholesterol Other        on both sides   Other Neg Hx     SOCIAL HISTORY: Social History   Socioeconomic History   Marital status: Married    Spouse name: Not on file   Number of children: 4   Years of education: Not on file   Highest education level: Master's degree (e.g., MA, MS, MEng, MEd, MSW, MBA)  Occupational History   Not on file  Tobacco Use   Smoking status: Never   Smokeless tobacco: Never  Vaping Use   Vaping status: Never Used  Substance and Sexual Activity   Alcohol use: Never   Drug use: Never   Sexual activity: Not on file    Comment: VASECTOMY  Other Topics Concern   Not on file  Social History Narrative   Lives at home with his wife & children   Right handed   Caffeine: quit in 2015 but now drinks diet soda which helps his headaches. Adam Ford  drinks about 6-7 cans during the work days.    Social Drivers of Corporate investment banker Strain: Not on file  Food Insecurity: Not on file  Transportation Needs: Not on file  Physical Activity: Not on file  Stress: Not on file  Social Connections: Not on file  Intimate Partner Violence: Not on file     PHYSICAL  EXAM  There were no vitals filed for this visit.    There is no height or weight on file to calculate BMI.  Generalized: Well developed Cardiology: normal rate and rhythm, no murmur noted Respiratory: clear to auscultation bilaterally, labored breathing but not diaphoretic  Neurological examination  Mentation: Alert but slow to respond, having difficulty getting words, Adam Ford is oriented to time, place, history taking. Follows all commands.  Cranial nerve II-XII: Pupils were equal round reactive to light. Extraocular movements were full, visual field were full on confrontational test. Facial sensation and strength were normal. Uvula tongue midline. Head turning and shoulder shrug  were normal and symmetric. Motor: The motor testing reveals 5 over 5 strength of all 4 extremities. Good symmetric motor tone is noted throughout.  Sensory: Sensory testing is intact to soft touch on all 4 extremities. No evidence of extinction is noted.  Coordination: Cerebellar testing reveals good finger-nose-finger and heel-to-shin bilaterally.  Gait and station: unable to assess as patient unable to stand in office, reports need to stretch out in chair to breath   DIAGNOSTIC DATA (LABS, IMAGING, TESTING) - I reviewed patient records, labs, notes, testing and imaging myself where available.      No data to display           Lab Results  Component Value Date   WBC 5.6 05/23/2021   HGB 15.6 05/23/2021   HCT 46.3 05/23/2021   MCV 83.9 05/23/2021   PLT 215 05/23/2021      Component Value Date/Time   NA 140 05/23/2021 0830   NA 142 04/23/2018 0902   K 3.6 05/23/2021 0830   CL 102 05/23/2021 0830   CO2 29 05/23/2021 0830   GLUCOSE 133 (H) 05/23/2021 0830   BUN 20 05/23/2021 0830   BUN 18 04/23/2018 0902   CREATININE 1.18 05/23/2021 0830   CALCIUM  9.5 05/23/2021 0830   PROT 6.1 (L) 12/18/2020 0741   PROT 7.0 04/23/2018 0902   ALBUMIN 2.7 (L) 12/18/2020 0741   ALBUMIN 4.5 04/23/2018 0902   AST  17 12/18/2020 0741   ALT 15 12/18/2020 0741   ALKPHOS 66 12/18/2020 0741   BILITOT 1.1 12/18/2020 0741   BILITOT 0.8 04/23/2018 0902   GFRNONAA >60 05/23/2021 0830   GFRAA >60 12/20/2019 1851   No results found for: "CHOL", "HDL", "LDLCALC", "LDLDIRECT", "TRIG", "CHOLHDL" Lab Results  Component Value Date   HGBA1C 6.5 (H) 04/13/2021   No results found for: "VITAMINB12" Lab Results  Component Value Date   TSH 0.956 04/23/2018    ASSESSMENT AND PLAN 63 y.o. year old male  has a past medical history of Anxiety, Blepharitis, both eyes, Bulging of thoracic intervertebral disc, Cancer (HCC), Glaucoma, both eyes, Headache, History of basal cell carcinoma (BCC) excision, History of kidney stones, History of sepsis (12/16/2020), Hypercholesteremia, Hypertension, OSA on CPAP, Pneumonia, PONV (postoperative nausea and vomiting), Renal calculi, Supraorbital neuralgia, Type 2 diabetes mellitus (HCC), and Wears glasses. here with   No diagnosis found.    Adam presents today for follow-up for supraorbital neuralgia. Adam Ford is doing well on gabapentin . Adam Ford is taking 600mg  in the  mornings. Rx updated to reflect current dose. Continue close follow up with care team. Continue CPAP and PCP follow up. May follow up with PCP for gabapentin  refills. Adam Ford verbalizes understanding and agreement with plan.   No orders of the defined types were placed in this encounter.    No orders of the defined types were placed in this encounter.     Terrilyn Fick, FNP-C 02/24/2024, 4:21 PM [facility] [address], [address] [place], [place] [address] [phone]

## 2024-02-24 NOTE — Patient Instructions (Signed)

## 2024-02-26 ENCOUNTER — Ambulatory Visit: Admitting: Family Medicine

## 2024-02-26 ENCOUNTER — Encounter: Payer: Self-pay | Admitting: Family Medicine

## 2024-02-26 VITALS — BP 145/81 | HR 73 | Ht 70.0 in | Wt 268.4 lb

## 2024-02-26 DIAGNOSIS — Z7985 Long-term (current) use of injectable non-insulin antidiabetic drugs: Secondary | ICD-10-CM | POA: Diagnosis not present

## 2024-02-26 DIAGNOSIS — G500 Trigeminal neuralgia: Secondary | ICD-10-CM

## 2024-02-26 DIAGNOSIS — E1142 Type 2 diabetes mellitus with diabetic polyneuropathy: Secondary | ICD-10-CM | POA: Diagnosis not present

## 2024-02-26 DIAGNOSIS — R29898 Other symptoms and signs involving the musculoskeletal system: Secondary | ICD-10-CM

## 2024-02-26 MED ORDER — GABAPENTIN 300 MG PO CAPS
600.0000 mg | ORAL_CAPSULE | Freq: Every day | ORAL | 3 refills | Status: AC
Start: 2024-02-26 — End: ?

## 2024-04-29 ENCOUNTER — Encounter (HOSPITAL_BASED_OUTPATIENT_CLINIC_OR_DEPARTMENT_OTHER): Payer: Self-pay

## 2024-04-29 ENCOUNTER — Emergency Department (HOSPITAL_BASED_OUTPATIENT_CLINIC_OR_DEPARTMENT_OTHER)
Admission: EM | Admit: 2024-04-29 | Discharge: 2024-04-29 | Disposition: A | Discharge status: Home / Self Care | Attending: Emergency Medicine | Admitting: Emergency Medicine

## 2024-04-29 ENCOUNTER — Other Ambulatory Visit: Payer: Self-pay

## 2024-04-29 ENCOUNTER — Emergency Department (HOSPITAL_BASED_OUTPATIENT_CLINIC_OR_DEPARTMENT_OTHER): Admitting: Radiology

## 2024-04-29 ENCOUNTER — Emergency Department (HOSPITAL_BASED_OUTPATIENT_CLINIC_OR_DEPARTMENT_OTHER)

## 2024-04-29 DIAGNOSIS — Z7984 Long term (current) use of oral hypoglycemic drugs: Secondary | ICD-10-CM | POA: Diagnosis not present

## 2024-04-29 DIAGNOSIS — Z79899 Other long term (current) drug therapy: Secondary | ICD-10-CM | POA: Insufficient documentation

## 2024-04-29 DIAGNOSIS — R079 Chest pain, unspecified: Secondary | ICD-10-CM

## 2024-04-29 DIAGNOSIS — R0789 Other chest pain: Secondary | ICD-10-CM | POA: Insufficient documentation

## 2024-04-29 DIAGNOSIS — E119 Type 2 diabetes mellitus without complications: Secondary | ICD-10-CM | POA: Diagnosis not present

## 2024-04-29 DIAGNOSIS — I10 Essential (primary) hypertension: Secondary | ICD-10-CM | POA: Diagnosis not present

## 2024-04-29 DIAGNOSIS — R0602 Shortness of breath: Secondary | ICD-10-CM | POA: Diagnosis not present

## 2024-04-29 LAB — BASIC METABOLIC PANEL WITH GFR
Anion gap: 14 (ref 5–15)
BUN: 18 mg/dL (ref 6–20)
CO2: 27 mmol/L (ref 22–32)
Calcium: 10.3 mg/dL (ref 8.9–10.3)
Chloride: 98 mmol/L (ref 98–111)
Creatinine, Ser: 1.4 mg/dL — ABNORMAL HIGH (ref 0.61–1.24)
GFR, Estimated: 58 mL/min — ABNORMAL LOW (ref >60)
Glucose, Bld: 179 mg/dL — ABNORMAL HIGH (ref 70–99)
Potassium: 4 mmol/L (ref 3.5–5.1)
Sodium: 138 mmol/L (ref 135–145)

## 2024-04-29 LAB — D-DIMER, QUANTITATIVE: D-Dimer, Quant: 0.29 ug{FEU}/mL (ref 0.00–0.50)

## 2024-04-29 LAB — CBC
HCT: 50.1 % (ref 39.0–52.0)
Hemoglobin: 17.7 g/dL — ABNORMAL HIGH (ref 13.0–17.0)
MCH: 30.1 pg (ref 26.0–34.0)
MCHC: 35.3 g/dL (ref 30.0–36.0)
MCV: 85.2 fL (ref 80.0–100.0)
Platelets: 226 K/uL (ref 150–400)
RBC: 5.88 MIL/uL — ABNORMAL HIGH (ref 4.22–5.81)
RDW: 12.8 % (ref 11.5–15.5)
WBC: 7.8 K/uL (ref 4.0–10.5)
nRBC: 0 % (ref 0.0–0.2)

## 2024-04-29 LAB — TROPONIN T, HIGH SENSITIVITY
Troponin T High Sensitivity: <15 ng/L (ref <19)
Troponin T High Sensitivity: <15 ng/L (ref <19)

## 2024-04-29 MED ORDER — MORPHINE SULFATE (PF) 4 MG/ML IV SOLN
4.0000 mg | Freq: Once | INTRAVENOUS | Status: AC
  Administered 2024-04-29: 4 mg via INTRAVENOUS
  Filled 2024-04-29: qty 1

## 2024-04-29 MED ORDER — ONDANSETRON HCL 4 MG/2ML IJ SOLN
4.0000 mg | Freq: Once | INTRAMUSCULAR | Status: AC
  Administered 2024-04-29: 4 mg via INTRAVENOUS
  Filled 2024-04-29: qty 2

## 2024-04-29 MED ORDER — IOHEXOL 350 MG/ML SOLN
100.0000 mL | Freq: Once | INTRAVENOUS | Status: AC | PRN
  Administered 2024-04-29: 100 mL via INTRAVENOUS

## 2024-04-29 MED ORDER — METHYLPREDNISOLONE 4 MG PO TBPK
ORAL_TABLET | ORAL | 0 refills | Status: AC
Start: 2024-04-29 — End: ?

## 2024-04-29 MED ORDER — KETOROLAC TROMETHAMINE 30 MG/ML IJ SOLN
15.0000 mg | Freq: Once | INTRAMUSCULAR | Status: AC
  Administered 2024-04-29: 15 mg via INTRAVENOUS
  Filled 2024-04-29: qty 1

## 2024-04-29 NOTE — Discharge Instructions (Addendum)
 You were seen in the emergency department today for chest pain. Your work-up in the emergency department has been overall reassuring. Your labs have been fairly normal and or similar to previous blood work you have had done. Your EKG and the enzyme we use to check your heart did not show an acute heart attack at this time. Your chest x-ray was normal, and chest CT did not show signs of an aortic aneurysm or dissection.  There was an incidental finding of some enlarged inguinal lymph nodes on your CT scan today please follow-up with your primary care provider regarding this.  We would like you to follow up closely with your primary care provider and the cardiologist provided in your discharge instructions. Return to the ER immediately should you experience any new or worsening symptoms including but not limited to return of pain, worsened pain, vomiting, shortness of breath, dizziness, lightheadedness, passing out, or any other concerns that you may have.

## 2024-04-29 NOTE — ED Provider Notes (Signed)
 [place] EMERGENCY DEPARTMENT AT [facility] Provider Note   CSN: [id number] Arrival date & time: 04/29/24  1749     Patient presents with: Chest Pain   Adam Ford is a 63 y.o. male.   Adam Ford is a 64 y.o. male with a history of anxiety, hypertension, hyperlipidemia, type 2 diabetes, sleep apnea, who presents to the emergency department for evaluation of sudden onset severe pain in the left side of his chest that started suddenly today while driving.  No physical exertion preceding the chest pain.  Pain is nonradiating and a constant intense sharp pain and pressure.  Patient reports that he feels like he cannot get comfortable due to the pain.  He reports some shortness of breath associated with the pain when it became more severe.  Pain is not pleuritic in nature.  He denies any recent long distance travel or surgeries and no history of blood clots, heart or lung conditions.  He does report a family history of aortic aneurysm.  No history of similar chest pain.  The history is provided by the patient, the spouse and medical records.  Chest Pain Associated symptoms: shortness of breath   Associated symptoms: no abdominal pain, no cough, no fever, no nausea and no vomiting        Prior to Admission medications   Medication Sig Start Date End Date Taking? Authorizing Provider  methylPREDNISolone  (MEDROL  DOSEPAK) 4 MG TBPK tablet Take as directed 04/29/24  Yes Alva Mort F, PA-C  amLODipine -benazepril  (LOTREL) 10-40 MG capsule Take 1 capsule by mouth at bedtime. 04/03/18   [provider]  Cholecalciferol  (VITAMIN D3) 50 MCG (2000 UT) capsule Take 2,000 Units by mouth at bedtime.    [provider]  dicyclomine  (BENTYL ) 20 MG tablet Take 10-20 mg by mouth daily as needed. 07/25/21   [provider]  docusate sodium  (COLACE) 100 MG capsule Take 1 capsule (100 mg total) by mouth daily as needed for up to 30 doses. 06/04/21   Selma Donnice SAUNDERS,  MD  gabapentin  (NEURONTIN ) 300 MG capsule Take 2 capsules (600 mg total) by mouth daily. 02/26/24   Lomax, Amy, NP  hydrochlorothiazide (HYDRODIURIL) 25 MG tablet Take 25 mg by mouth daily.    [provider]  methocarbamol (ROBAXIN) 500 MG tablet Take 500 mg by mouth every 6 (six) hours as needed. 12/22/21   [provider]  metoprolol  succinate (TOPROL -XL) 50 MG 24 hr tablet Take 100 mg by mouth at bedtime. 04/06/18   [provider]  Multiple Vitamins-Minerals (CENTRUM SILVER 50+MEN PO) Take 1 tablet by mouth at bedtime.    [provider]  oxyCODONE -acetaminophen  (PERCOCET) 5-325 MG tablet Take 1 tablet by mouth every 4 (four) hours as needed for up to 18 doses for severe pain. 06/04/21   Selma Donnice SAUNDERS, MD  Potassium Citrate 15 MEQ (1620 MG) TBCR Take 1 tablet by mouth 2 (two) times daily. 12/22/21   [provider]  rosuvastatin  (CRESTOR ) 10 MG tablet Take 10 mg by mouth at bedtime. 04/06/18   [provider]  Semaglutide (OZEMPIC, 0.25 OR 0.5 MG/DOSE, [place]) Inject 0.5 mg into the skin once a week.    [provider]  tamsulosin  (FLOMAX ) 0.4 MG CAPS capsule Take 1 capsule (0.4 mg total) by mouth daily. 03/05/21   Selma Donnice SAUNDERS, MD  testosterone cypionate (DEPOTESTOSTERONE CYPIONATE) 200 MG/ML injection Inject 0.75 mg into the muscle every 14 (fourteen) days.    [provider]  Allergies: Bee venom    Review of Systems  Constitutional:  Negative for chills and fever.  HENT: Negative.    Respiratory:  Positive for shortness of breath. Negative for cough.   Cardiovascular:  Positive for chest pain. Negative for leg swelling.  Gastrointestinal:  Negative for abdominal pain, nausea and vomiting.    Updated Vital Signs BP (!) 143/82   Pulse 79   Temp 99.2 F (37.3 C) (Temporal)   Resp 16   Ht 5' 10 (1.778 m)   Wt 121.7 kg   SpO2 95%   BMI 38.50 kg/m   Physical Exam Vitals and nursing note reviewed.   Constitutional:      General: He is in acute distress.     Appearance: Normal appearance. He is well-developed. He is not diaphoretic.     Comments: Patient appears anxious and uncomfortable, having difficulty sitting still due to chest pain.  HENT:     Head: Normocephalic and atraumatic.  Eyes:     General:        Right eye: No discharge.        Left eye: No discharge.     Pupils: Pupils are equal, round, and reactive to light.  Cardiovascular:     Rate and Rhythm: Normal rate and regular rhythm.     Pulses: Normal pulses.          Radial pulses are 2+ on the right side and 2+ on the left side.       Dorsalis pedis pulses are 2+ on the right side and 2+ on the left side.     Heart sounds: Normal heart sounds. No murmur heard.    No friction rub. No gallop.  Pulmonary:     Effort: Pulmonary effort is normal. No respiratory distress.     Breath sounds: Normal breath sounds. No wheezing or rales.     Comments: Respirations equal and unlabored, patient able to speak in full sentences, lungs clear to auscultation bilaterally  Chest:     Chest wall: Tenderness present.  Abdominal:     General: Bowel sounds are normal. There is no distension.     Palpations: Abdomen is soft. There is no mass.     Tenderness: There is no abdominal tenderness. There is no guarding.     Comments: Abdomen soft, nondistended, nontender to palpation in all quadrants without guarding or peritoneal signs  Musculoskeletal:        General: No deformity.     Cervical back: Neck supple.  Skin:    General: Skin is warm and dry.     Capillary Refill: Capillary refill takes less than 2 seconds.  Neurological:     Mental Status: He is alert and oriented to person, place, and time.     Coordination: Coordination normal.     Comments: Speech is clear, able to follow commands Moves extremities without ataxia, coordination intact  Psychiatric:        Mood and Affect: Mood is anxious.        Behavior: Behavior  normal.     (all labs ordered are listed, but only abnormal results are displayed) Labs Reviewed  BASIC METABOLIC PANEL WITH GFR - Abnormal; Notable for the following components:      Result Value   Glucose, Bld 179 (*)    Creatinine, Ser 1.40 (*)    GFR, Estimated 58 (*)    All other components within normal limits  CBC - Abnormal; Notable for the following components:  RBC 5.88 (*)    Hemoglobin 17.7 (*)    All other components within normal limits  D-DIMER, QUANTITATIVE  TROPONIN T, HIGH SENSITIVITY  TROPONIN T, HIGH SENSITIVITY    EKG: EKG Interpretation Date/Time:  Thursday April 29 2024 19:30:22 EDT Ventricular Rate:  82 PR Interval:  172 QRS Duration:  95 QT Interval:  383 QTC Calculation: 448 R Axis:   218  Text Interpretation: Sinus rhythm Consider right ventricular hypertrophy Borderline ST elevation, lateral leads Confirmed by Darra Chew [phone]) on 04/30/2024 11:58:32 AM  Radiology: No results found.   Procedures   Medications Ordered in the ED  ondansetron  (ZOFRAN ) injection 4 mg (4 mg Intravenous Given 04/29/24 1828)  morphine  (PF) 4 MG/ML injection 4 mg (4 mg Intravenous Given 04/29/24 1828)  iohexol  (OMNIPAQUE ) 350 MG/ML injection 100 mL (100 mLs Intravenous Contrast Given 04/29/24 1836)  ketorolac  (TORADOL ) 30 MG/ML injection 15 mg (15 mg Intravenous Given 04/29/24 1943)                                    Medical Decision Making Amount and/or Complexity of Data Reviewed Labs: ordered. Radiology: ordered.  Risk Prescription drug management.   63 year old male presents with severe left-sided chest pain that started while driving, family history of aortic aneurysm, no known personal cardiac history.  On arrival patient mildly hypertensive but otherwise vital stable, satting well on room air.  But appears very uncomfortable, having difficulty sitting still due to pain, given family history we will proceed with dissection study to assess for aortic  pathology.  Differential also includes ACS, stable angina, PE, GERD, costochondritis, musculoskeletal pain, anxiety.  EKG shows normal sinus rhythm with borderline ST elevations in the lateral leads but no reciprocal changes.  Repeat EKG shows normal sinus rhythm without ST changes.  Portable chest x-ray obtained at bedside and viewed without evidence of pneumothorax, widened mediastinum or other acute pathologies.  , No leukocytosis, slightly elevated hemoglobin, no significant electrolyte derangements, renal function at baseline, troponin negative x 2, D-dimer not elevated.  CT angio with no evidence of aortic dissection or other acute pathology.  After patient received morphine , Toradol  and Zofran  patient's pain has significantly improved and he is much more comfortable.  Chest wall tender to palpation on the left side without overlying skin changes, question possible musculoskeletal pain or costochondritis as cause.  Given underlying mild renal dysfunction would like to avoid NSAIDs but will treat with Tylenol  and short course of steroids.  Discussed outpatient follow-up with cardiology and primary care.  Discharged in good condition.  Return precautions discussed.     Final diagnoses:  Left-sided chest pain    ED Discharge Orders          Ordered    Ambulatory referral to Cardiology       Comments: If you have not heard from the Cardiology office within the next 72 hours please call [phone].   04/29/24 2055    methylPREDNISolone  (MEDROL  DOSEPAK) 4 MG TBPK tablet        04/29/24 2055               Alva Larraine FALCON, PA-C 05/05/24 2309    Lenor Hollering, MD 05/07/24 1346

## 2024-04-29 NOTE — ED Triage Notes (Signed)
 Arrives POV with complaints of left side chest pain that started today while driving. Patient rates pain an 8/10. Some shortness of breath as well.

## 2024-05-10 ENCOUNTER — Encounter: Payer: Self-pay | Admitting: Family Medicine

## 2025-02-28 ENCOUNTER — Ambulatory Visit: Admitting: Family Medicine
